# Patient Record
Sex: Female | Born: 1960
Health system: Southern US, Community
[De-identification: ages and names within clinical notes are randomized; demographics above are authoritative.]

## PROBLEM LIST (undated history)

## (undated) DIAGNOSIS — K449 Diaphragmatic hernia without obstruction or gangrene: Secondary | ICD-10-CM

## (undated) DIAGNOSIS — A0472 Enterocolitis due to Clostridium difficile, not specified as recurrent: Secondary | ICD-10-CM

## (undated) DIAGNOSIS — I10 Essential (primary) hypertension: Secondary | ICD-10-CM

## (undated) DIAGNOSIS — K8020 Calculus of gallbladder without cholecystitis without obstruction: Secondary | ICD-10-CM

## (undated) DIAGNOSIS — K219 Gastro-esophageal reflux disease without esophagitis: Secondary | ICD-10-CM

## (undated) DIAGNOSIS — K760 Fatty (change of) liver, not elsewhere classified: Secondary | ICD-10-CM

## (undated) DIAGNOSIS — E785 Hyperlipidemia, unspecified: Secondary | ICD-10-CM

## (undated) DIAGNOSIS — H40009 Preglaucoma, unspecified, unspecified eye: Secondary | ICD-10-CM

## (undated) DIAGNOSIS — K700 Alcoholic fatty liver: Secondary | ICD-10-CM

## (undated) DIAGNOSIS — R7303 Prediabetes: Secondary | ICD-10-CM

## (undated) DIAGNOSIS — R002 Palpitations: Secondary | ICD-10-CM

## (undated) DIAGNOSIS — F17200 Nicotine dependence, unspecified, uncomplicated: Secondary | ICD-10-CM

## (undated) HISTORY — DX: Alcoholic fatty liver: K70.0

## (undated) HISTORY — DX: Enterocolitis due to Clostridium difficile, not specified as recurrent: A04.72

## (undated) HISTORY — DX: Calculus of gallbladder without cholecystitis without obstruction: K80.20

## (undated) HISTORY — DX: Preglaucoma, unspecified, unspecified eye: H40.009

## (undated) HISTORY — DX: Gastro-esophageal reflux disease without esophagitis: K21.9

## (undated) HISTORY — PX: CERVIX LESION DESTRUCTION: SHX591

## (undated) HISTORY — DX: Essential (primary) hypertension: I10

## (undated) HISTORY — DX: Nicotine dependence, unspecified, uncomplicated: F17.200

## (undated) HISTORY — PX: CHOLECYSTECTOMY: SHX55

## (undated) HISTORY — DX: Palpitations: R00.2

## (undated) HISTORY — DX: Prediabetes: R73.03

## (undated) HISTORY — DX: Fatty (change of) liver, not elsewhere classified: K76.0

## (undated) HISTORY — PX: ESOPHAGOGASTRODUODENOSCOPY: SHX1529

## (undated) HISTORY — PX: OVARY SURGERY: SHX727

## (undated) HISTORY — PX: COLONOSCOPY: SHX174

## (undated) HISTORY — DX: Hyperlipidemia, unspecified: E78.5

## (undated) HISTORY — DX: Diaphragmatic hernia without obstruction or gangrene: K44.9

---
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
## 1998-07-08 ENCOUNTER — Encounter: Payer: Self-pay | Admitting: Internal Medicine

## 1998-07-08 ENCOUNTER — Ambulatory Visit (HOSPITAL_COMMUNITY): Admission: RE | Admit: 1998-07-08 | Discharge: 1998-07-08 | Payer: Self-pay | Admitting: Internal Medicine

## 1998-07-15 ENCOUNTER — Encounter: Payer: Self-pay | Admitting: Internal Medicine

## 1998-07-15 ENCOUNTER — Ambulatory Visit (HOSPITAL_COMMUNITY): Admission: RE | Admit: 1998-07-15 | Discharge: 1998-07-15 | Payer: Self-pay | Admitting: Internal Medicine

## 1998-12-09 ENCOUNTER — Emergency Department (HOSPITAL_COMMUNITY): Admission: EM | Admit: 1998-12-09 | Discharge: 1998-12-09 | Payer: Self-pay | Admitting: Emergency Medicine

## 1998-12-31 ENCOUNTER — Encounter: Payer: Self-pay | Admitting: Emergency Medicine

## 1998-12-31 ENCOUNTER — Emergency Department (HOSPITAL_COMMUNITY): Admission: EM | Admit: 1998-12-31 | Discharge: 1998-12-31 | Payer: Self-pay | Admitting: Emergency Medicine

## 2000-10-31 ENCOUNTER — Encounter: Payer: Self-pay | Admitting: Emergency Medicine

## 2000-10-31 ENCOUNTER — Emergency Department (HOSPITAL_COMMUNITY): Admission: EM | Admit: 2000-10-31 | Discharge: 2000-10-31 | Payer: Self-pay | Admitting: Emergency Medicine

## 2001-02-01 ENCOUNTER — Other Ambulatory Visit: Admission: RE | Admit: 2001-02-01 | Discharge: 2001-02-01 | Payer: Self-pay | Admitting: *Deleted

## 2002-03-14 ENCOUNTER — Emergency Department (HOSPITAL_COMMUNITY): Admission: EM | Admit: 2002-03-14 | Discharge: 2002-03-14 | Payer: Self-pay | Admitting: *Deleted

## 2002-10-01 ENCOUNTER — Emergency Department (HOSPITAL_COMMUNITY): Admission: EM | Admit: 2002-10-01 | Discharge: 2002-10-01 | Payer: Self-pay | Admitting: Emergency Medicine

## 2002-12-03 ENCOUNTER — Emergency Department (HOSPITAL_COMMUNITY): Admission: EM | Admit: 2002-12-03 | Discharge: 2002-12-03 | Payer: Self-pay | Admitting: Emergency Medicine

## 2002-12-03 ENCOUNTER — Encounter: Payer: Self-pay | Admitting: Emergency Medicine

## 2003-01-29 ENCOUNTER — Ambulatory Visit (HOSPITAL_COMMUNITY): Admission: RE | Admit: 2003-01-29 | Discharge: 2003-01-29 | Payer: Self-pay | Admitting: Internal Medicine

## 2003-02-05 ENCOUNTER — Ambulatory Visit (HOSPITAL_COMMUNITY): Admission: RE | Admit: 2003-02-05 | Discharge: 2003-02-05 | Payer: Self-pay | Admitting: Internal Medicine

## 2003-09-11 ENCOUNTER — Other Ambulatory Visit: Admission: RE | Admit: 2003-09-11 | Discharge: 2003-09-11 | Payer: Self-pay | Admitting: *Deleted

## 2003-09-13 ENCOUNTER — Encounter: Admission: RE | Admit: 2003-09-13 | Discharge: 2003-09-13 | Payer: Self-pay | Admitting: *Deleted

## 2003-11-08 ENCOUNTER — Emergency Department (HOSPITAL_COMMUNITY): Admission: EM | Admit: 2003-11-08 | Discharge: 2003-11-08 | Payer: Self-pay | Admitting: Emergency Medicine

## 2003-11-14 ENCOUNTER — Emergency Department (HOSPITAL_COMMUNITY): Admission: EM | Admit: 2003-11-14 | Discharge: 2003-11-14 | Payer: Self-pay | Admitting: Emergency Medicine

## 2003-12-14 ENCOUNTER — Emergency Department (HOSPITAL_COMMUNITY): Admission: EM | Admit: 2003-12-14 | Discharge: 2003-12-15 | Payer: Self-pay | Admitting: Emergency Medicine

## 2003-12-15 ENCOUNTER — Emergency Department (HOSPITAL_COMMUNITY): Admission: EM | Admit: 2003-12-15 | Discharge: 2003-12-15 | Payer: Self-pay | Admitting: Emergency Medicine

## 2004-01-22 LAB — HM COLONOSCOPY: HM Colonoscopy: NORMAL

## 2004-04-05 LAB — CONVERTED CEMR LAB: Pap Smear: NORMAL

## 2004-07-26 ENCOUNTER — Emergency Department (HOSPITAL_COMMUNITY): Admission: EM | Admit: 2004-07-26 | Discharge: 2004-07-26 | Payer: Self-pay | Admitting: Family Medicine

## 2004-09-23 ENCOUNTER — Emergency Department (HOSPITAL_COMMUNITY): Admission: EM | Admit: 2004-09-23 | Discharge: 2004-09-23 | Payer: Self-pay | Admitting: Family Medicine

## 2004-11-17 ENCOUNTER — Emergency Department (HOSPITAL_COMMUNITY): Admission: EM | Admit: 2004-11-17 | Discharge: 2004-11-17 | Payer: Self-pay | Admitting: Emergency Medicine

## 2004-12-07 ENCOUNTER — Emergency Department (HOSPITAL_COMMUNITY): Admission: EM | Admit: 2004-12-07 | Discharge: 2004-12-07 | Payer: Self-pay | Admitting: Emergency Medicine

## 2004-12-09 ENCOUNTER — Ambulatory Visit: Payer: Self-pay | Admitting: Internal Medicine

## 2005-04-12 ENCOUNTER — Ambulatory Visit: Payer: Self-pay | Admitting: Internal Medicine

## 2005-04-15 ENCOUNTER — Ambulatory Visit (HOSPITAL_COMMUNITY): Admission: RE | Admit: 2005-04-15 | Discharge: 2005-04-15 | Payer: Self-pay | Admitting: Internal Medicine

## 2005-09-27 ENCOUNTER — Emergency Department (HOSPITAL_COMMUNITY): Admission: EM | Admit: 2005-09-27 | Discharge: 2005-09-27 | Payer: Self-pay | Admitting: Emergency Medicine

## 2006-01-11 ENCOUNTER — Emergency Department (HOSPITAL_COMMUNITY): Admission: EM | Admit: 2006-01-11 | Discharge: 2006-01-11 | Payer: Self-pay | Admitting: Emergency Medicine

## 2006-02-07 ENCOUNTER — Emergency Department (HOSPITAL_COMMUNITY): Admission: EM | Admit: 2006-02-07 | Discharge: 2006-02-07 | Payer: Self-pay | Admitting: Emergency Medicine

## 2006-10-07 ENCOUNTER — Emergency Department (HOSPITAL_COMMUNITY): Admission: EM | Admit: 2006-10-07 | Discharge: 2006-10-07 | Payer: Self-pay | Admitting: Emergency Medicine

## 2007-02-01 ENCOUNTER — Emergency Department (HOSPITAL_COMMUNITY): Admission: EM | Admit: 2007-02-01 | Discharge: 2007-02-01 | Payer: Self-pay | Admitting: Emergency Medicine

## 2007-04-21 ENCOUNTER — Emergency Department (HOSPITAL_COMMUNITY): Admission: EM | Admit: 2007-04-21 | Discharge: 2007-04-21 | Payer: Self-pay | Admitting: Emergency Medicine

## 2007-06-02 DIAGNOSIS — I10 Essential (primary) hypertension: Secondary | ICD-10-CM | POA: Insufficient documentation

## 2007-07-31 ENCOUNTER — Emergency Department (HOSPITAL_COMMUNITY): Admission: EM | Admit: 2007-07-31 | Discharge: 2007-08-01 | Payer: Self-pay | Admitting: Emergency Medicine

## 2007-08-28 ENCOUNTER — Emergency Department (HOSPITAL_COMMUNITY): Admission: EM | Admit: 2007-08-28 | Discharge: 2007-08-28 | Payer: Self-pay | Admitting: Emergency Medicine

## 2007-09-12 ENCOUNTER — Emergency Department (HOSPITAL_COMMUNITY): Admission: EM | Admit: 2007-09-12 | Discharge: 2007-09-12 | Payer: Self-pay | Admitting: Emergency Medicine

## 2007-11-03 ENCOUNTER — Emergency Department (HOSPITAL_COMMUNITY): Admission: EM | Admit: 2007-11-03 | Discharge: 2007-11-03 | Payer: Self-pay | Admitting: Emergency Medicine

## 2008-03-18 ENCOUNTER — Emergency Department (HOSPITAL_COMMUNITY): Admission: EM | Admit: 2008-03-18 | Discharge: 2008-03-18 | Payer: Self-pay | Admitting: Family Medicine

## 2009-01-05 ENCOUNTER — Emergency Department (HOSPITAL_COMMUNITY): Admission: EM | Admit: 2009-01-05 | Discharge: 2009-01-05 | Payer: Self-pay | Admitting: Emergency Medicine

## 2009-05-05 ENCOUNTER — Emergency Department (HOSPITAL_COMMUNITY): Admission: EM | Admit: 2009-05-05 | Discharge: 2009-05-05 | Payer: Self-pay | Admitting: Family Medicine

## 2009-05-10 ENCOUNTER — Emergency Department: Payer: Self-pay | Admitting: Emergency Medicine

## 2009-07-20 ENCOUNTER — Emergency Department (HOSPITAL_COMMUNITY): Admission: EM | Admit: 2009-07-20 | Discharge: 2009-07-21 | Payer: Self-pay | Admitting: Emergency Medicine

## 2010-01-30 ENCOUNTER — Emergency Department (HOSPITAL_COMMUNITY)
Admission: EM | Admit: 2010-01-30 | Discharge: 2010-01-30 | Payer: Self-pay | Source: Home / Self Care | Admitting: Family Medicine

## 2010-02-03 ENCOUNTER — Emergency Department (HOSPITAL_COMMUNITY)
Admission: EM | Admit: 2010-02-03 | Discharge: 2010-02-03 | Payer: Self-pay | Source: Home / Self Care | Admitting: Family Medicine

## 2010-04-07 ENCOUNTER — Emergency Department (HOSPITAL_COMMUNITY)
Admission: EM | Admit: 2010-04-07 | Discharge: 2010-04-07 | Payer: Self-pay | Source: Home / Self Care | Admitting: Family Medicine

## 2010-04-12 ENCOUNTER — Emergency Department (HOSPITAL_COMMUNITY)
Admission: EM | Admit: 2010-04-12 | Discharge: 2010-04-12 | Payer: Self-pay | Source: Home / Self Care | Admitting: Emergency Medicine

## 2010-04-13 ENCOUNTER — Emergency Department (HOSPITAL_COMMUNITY)
Admission: EM | Admit: 2010-04-13 | Discharge: 2010-04-13 | Payer: Self-pay | Source: Home / Self Care | Admitting: Emergency Medicine

## 2010-04-14 ENCOUNTER — Emergency Department (HOSPITAL_COMMUNITY)
Admission: EM | Admit: 2010-04-14 | Discharge: 2010-04-14 | Payer: Self-pay | Source: Home / Self Care | Admitting: Family Medicine

## 2010-04-16 ENCOUNTER — Encounter: Payer: Self-pay | Admitting: Internal Medicine

## 2010-04-16 ENCOUNTER — Ambulatory Visit
Admission: RE | Admit: 2010-04-16 | Discharge: 2010-04-16 | Payer: Self-pay | Source: Home / Self Care | Attending: Internal Medicine | Admitting: Internal Medicine

## 2010-04-16 ENCOUNTER — Other Ambulatory Visit: Payer: Self-pay | Admitting: Internal Medicine

## 2010-04-16 DIAGNOSIS — E785 Hyperlipidemia, unspecified: Secondary | ICD-10-CM | POA: Insufficient documentation

## 2010-04-16 DIAGNOSIS — R002 Palpitations: Secondary | ICD-10-CM | POA: Insufficient documentation

## 2010-04-16 LAB — CBC WITH DIFFERENTIAL/PLATELET
Basophils Absolute: 0 10*3/uL (ref 0.0–0.1)
Basophils Relative: 0.6 % (ref 0.0–3.0)
Eosinophils Absolute: 0.1 10*3/uL (ref 0.0–0.7)
Eosinophils Relative: 1.8 % (ref 0.0–5.0)
HCT: 41.2 % (ref 36.0–46.0)
Hemoglobin: 13.9 g/dL (ref 12.0–15.0)
Lymphocytes Relative: 38.4 % (ref 12.0–46.0)
Lymphs Abs: 2.1 10*3/uL (ref 0.7–4.0)
MCHC: 33.9 g/dL (ref 30.0–36.0)
MCV: 99 fl (ref 78.0–100.0)
Monocytes Absolute: 0.5 10*3/uL (ref 0.1–1.0)
Monocytes Relative: 9.4 % (ref 3.0–12.0)
Neutro Abs: 2.7 10*3/uL (ref 1.4–7.7)
Neutrophils Relative %: 49.8 % (ref 43.0–77.0)
Platelets: 295 10*3/uL (ref 150.0–400.0)
RBC: 4.16 Mil/uL (ref 3.87–5.11)
RDW: 13.7 % (ref 11.5–14.6)
WBC: 5.5 10*3/uL (ref 4.5–10.5)

## 2010-04-16 LAB — BASIC METABOLIC PANEL
BUN: 18 mg/dL (ref 6–23)
CO2: 28 mEq/L (ref 19–32)
Calcium: 9.4 mg/dL (ref 8.4–10.5)
Chloride: 103 mEq/L (ref 96–112)
Creatinine, Ser: 0.8 mg/dL (ref 0.4–1.2)
GFR: 84.83 mL/min (ref >60.00)
Glucose, Bld: 89 mg/dL (ref 70–99)
Potassium: 4.5 mEq/L (ref 3.5–5.1)
Sodium: 138 mEq/L (ref 135–145)

## 2010-04-16 LAB — LIPID PANEL
Cholesterol: 175 mg/dL (ref 0–200)
HDL: 67.3 mg/dL (ref >39.00)
LDL Cholesterol: 101 mg/dL — ABNORMAL HIGH (ref 0–99)
Total CHOL/HDL Ratio: 3
Triglycerides: 36 mg/dL (ref 0.0–149.0)
VLDL: 7.2 mg/dL (ref 0.0–40.0)

## 2010-04-16 LAB — HEPATIC FUNCTION PANEL
ALT: 18 U/L (ref 0–35)
AST: 22 U/L (ref 0–37)
Albumin: 4 g/dL (ref 3.5–5.2)
Alkaline Phosphatase: 53 U/L (ref 39–117)
Bilirubin, Direct: 0.1 mg/dL (ref 0.0–0.3)
Total Bilirubin: 0.6 mg/dL (ref 0.3–1.2)
Total Protein: 6.8 g/dL (ref 6.0–8.3)

## 2010-04-16 LAB — TSH: TSH: 1.9 u[IU]/mL (ref 0.35–5.50)

## 2010-04-17 ENCOUNTER — Telehealth: Payer: Self-pay | Admitting: Internal Medicine

## 2010-04-27 DIAGNOSIS — K449 Diaphragmatic hernia without obstruction or gangrene: Secondary | ICD-10-CM | POA: Insufficient documentation

## 2010-04-27 DIAGNOSIS — H40009 Preglaucoma, unspecified, unspecified eye: Secondary | ICD-10-CM | POA: Insufficient documentation

## 2010-04-28 ENCOUNTER — Ambulatory Visit
Admission: RE | Admit: 2010-04-28 | Discharge: 2010-04-28 | Payer: Self-pay | Source: Home / Self Care | Attending: Cardiovascular Disease | Admitting: Cardiovascular Disease

## 2010-04-28 DIAGNOSIS — F17200 Nicotine dependence, unspecified, uncomplicated: Secondary | ICD-10-CM | POA: Insufficient documentation

## 2010-04-28 DIAGNOSIS — K700 Alcoholic fatty liver: Secondary | ICD-10-CM | POA: Insufficient documentation

## 2010-05-07 ENCOUNTER — Emergency Department (HOSPITAL_COMMUNITY)
Admission: EM | Admit: 2010-05-07 | Discharge: 2010-05-07 | Disposition: A | Discharge status: Home / Self Care | Payer: 59 | Attending: Emergency Medicine | Admitting: Emergency Medicine

## 2010-05-07 ENCOUNTER — Emergency Department (HOSPITAL_COMMUNITY): Payer: 59

## 2010-05-07 DIAGNOSIS — I10 Essential (primary) hypertension: Secondary | ICD-10-CM | POA: Insufficient documentation

## 2010-05-07 DIAGNOSIS — IMO0002 Reserved for concepts with insufficient information to code with codable children: Secondary | ICD-10-CM | POA: Insufficient documentation

## 2010-05-07 DIAGNOSIS — Y92009 Unspecified place in unspecified non-institutional (private) residence as the place of occurrence of the external cause: Secondary | ICD-10-CM | POA: Insufficient documentation

## 2010-05-07 DIAGNOSIS — S90129A Contusion of unspecified lesser toe(s) without damage to nail, initial encounter: Secondary | ICD-10-CM | POA: Insufficient documentation

## 2010-05-07 DIAGNOSIS — M79609 Pain in unspecified limb: Secondary | ICD-10-CM | POA: Insufficient documentation

## 2010-05-07 NOTE — Assessment & Plan Note (Signed)
Summary: palpitations/mt      Allergies Added:   Primary Provider:  Janith Lima MD  CC:  referal from Dr. Ronnald Ramp for palpitaions.Marland KitchenMarland KitchenMarland KitchenMarland Kitchenpt states since shes been off caffine she has had no symptoms..pt is currently on no meds.  History of Present Illness: 50 yo referred for palpiatoins.  Benign sounding with occasional flip flops.  No rapid prolonged episodes.  No previous cardiac problems.  CRF's include smoking and positive family history  Reviewed ECG and it is normal 04/16/10.  Counseled for less than 10 minutes on smoking cessation and nicotine relationship to palpitatoins and developing CAD.  Last quit 5 years ago.  Suggested nicotine gum or patches starting at 75m.  Also has issues with ETOH.  3-4 Mikes Hard Lemonades/day.  Has cut back recently.  Has atypical muscular pains in chest and back.  No cough, sputum, edema or history of CHF or valvular heart disease  Current Problems (verified): 1)  Hyperlipidemia  (ICD-272.4) 2)  Palpitations, Occasional  (ICD-785.1) 3)  Hypertension  (ICD-401.9) 4)  Hiatal Hernia  (ICD-553.3) 5)  Glaucoma, Borderline  (ICD-365.00) 6)  Routine General Medical Exam@[facility]  (ICD-V70.0) 7)  Family History of Cad Female 1st Degree Relative <50  (ICD-V17.3)  Current Medications (verified): 1)  None  Allergies (verified): 1)  ! Penicillin 2)  ! Doxycycline 3)  ! Sulfa 4)  ! 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   Vital Signs:  Patient profile:   50year old female Menstrual status:  postmenopausal Height:      62 inches Weight:      157 pounds BMI:     28.82 Pulse rate:   84 / minute Resp:     14 per minute BP sitting:   129 / 87  (left arm)  Vitals Entered By: KBurnett Kanaris(April 28, 2010 8:32 AM)  Physical Exam  General:  Affect appropriate Healthy:  appears stated age HEENT: normal Neck supple with no adenopathy JVP normal no bruits no thyromegaly Lungs clear with no wheezing and good diaphragmatic motion Heart:  S1/S2 no murmur,rub, gallop or click PMI normal Abdomen: benighn, BS positve, no tenderness, no AAA no bruit.  No HSM or HJR Distal pulses intact with no bruits No edema Neuro non-focal Skin warm and dry    Impression & Recommendations:  Problem # 1:  PALPITATIONS, OCCASIONAL (ICD-785.1) Benign.  No need for monitor if stress echo normal Orders: Stress Echo (Stress Echo)  Problem # 2:  HYPERTENSION (ICD-401.9) Low sodium diet.  Monitor at home  Problem # 3:  FAMILY HISTORY OF CAD FEMALE 1ST DEGREE RELATIVE <50 (ICD-V17.3) Smoker with family history and atypical chest pain.  F/U stress echo.    Problem # 4:  ALCOHOLIC FATTY LIVER (ITDD-220.2 Discussed ETOH with patient.  Clearly lifestyle  of smoking and drinking is not conducive to good health.  Likely related to palpitations as well.  Encouraged her to seek counseling if she resumes daily ETOH again.  Patient Instructions: 1)  Your physician has requested that you have a stress echocardiogram. For further information please visit [email or web address].  Please follow instruction sheet as  given.

## 2010-05-07 NOTE — Progress Notes (Signed)
Summary: results  Phone Note Call from Patient Call back at Home Phone [phone]   Caller: Patient Call For: Janith Lima MD Summary of Call: Pt called and states she is returning a call from the office. She believes MD or CMA may have called her. Initial call taken by: Denice Paradise,  April 17, 2010 12:43 PM  Follow-up for Phone Call        Need to inform labs are normal..Lakisha Archie CMA  April 17, 2010 1:54 PM   LMOVM for pt to call back// labs normal..Lakisha Archie CMA  April 20, 2010 10:09 AM   Patient notified// copy mailed out.Ellison Hughs Archie CMA  April 20, 2010 10:14 AM

## 2010-05-07 NOTE — Assessment & Plan Note (Signed)
Summary: NEW/ BP PROBLEMS/ UMR/ /NWS  #   Vital Signs:  Patient profile:   50 year old female Menstrual status:  postmenopausal Height:      62 inches Weight:      155 pounds BMI:     28.45 O2 Sat:      98 % on Room air Temp:     96.5 degrees F oral Pulse rate:   86 / minute Pulse rhythm:   regular Resp:     16 per minute BP sitting:   128 / 86  (left arm) Cuff size:   large  Vitals Entered By: Estell Harpin CMA (April 16, 2010 9:06 AM)  Nutrition Counseling: Patient's BMI is greater than 25 and therefore counseled on weight management options.  O2 Flow:  Room air CC: New to establish, Preventive Care, Hypertension Management Is Patient Diabetic? No  Does patient need assistance? Functional Status Self care Ambulation Normal     Menstrual Status postmenopausal Last PAP Result normal   Primary Care Provider:  Janith Lima MD  CC:  New to establish, Preventive Care, and Hypertension Management.  History of Present Illness: New to me she reports intermittent palpitations for several years. She has been to the ER about one year ago and she tells me that her EKG was normal. She went to the ER earlier this week for a severe toothache and she says that her BP was high but she tells me that when she is calm and pain free her BP is always less then 130/80. She notices her palpitations after she has been active and sits down, then she feels a few skipped beats. She feels like her palpitations have been more frequent for the last month.  Hypertension History:      She complains of palpitations, but denies headache, chest pain, dyspnea with exertion, orthopnea, PND, peripheral edema, visual symptoms, neurologic problems, and syncope.        Positive major cardiovascular risk factors include hyperlipidemia, hypertension, family history for ischemic heart disease (males less than 38 years old), and current tobacco user.  Negative major cardiovascular risk factors include female  age less than 35 years old and no history of diabetes.        Further assessment for target organ damage reveals no history of ASHD, cardiac end-organ damage (CHF/LVH), stroke/TIA, peripheral vascular disease, renal insufficiency, or hypertensive retinopathy.     Preventive Screening-Counseling & Management  Alcohol-Tobacco     Alcohol drinks/day: 3     Alcohol type: spirits     >5/day in last 3 mos: no     Alcohol Counseling: to decrease amount and/or frequency of alcohol intake     Feels need to cut down: no     Feels annoyed by complaints: no     Feels guilty re: drinking: no     Needs 'eye opener' in am: no     Smoking Status: current     Smoking Cessation Counseling: yes     Smoke Cessation Stage: precontemplative     Packs/Day: 1.0     Year Started: 1991     Pack years: 20     Tobacco Counseling: to quit use of tobacco products  Caffeine-Diet-Exercise     Does Patient Exercise: yes  Hep-HIV-STD-Contraception     Hepatitis Risk: no risk noted     HIV Risk: no risk noted     STD Risk: no risk noted      Sexual History:  currently monogamous.  Drug Use:  no.        Blood Transfusions:  no.    Current Medications (verified): 1)  Clindamycin Hcl 150 Mg Caps (Clindamycin Hcl) .... Take 1 Tablet By Mouth Four Times A Day  Allergies (verified): 1)  ! Penicillin 2)  ! Doxycycline 3)  ! Sulfa 4)  ! Tessalon Perles (Benzonatate)  Past History:  Family History: Last updated: 04/16/2010 Family History of CAD Female 1st degree relative <50 Family History of Thromboembolism clotting disorder- mother  Risk Factors: Alcohol Use: 3 (04/16/2010) >5 drinks/d w/in last 3 months: no (04/16/2010) Exercise: yes (04/16/2010)  Past Medical History: Hyperlipidemia Hypertension  Past Surgical History: Denies surgical history  Family History: Reviewed history and no changes required. Family History of CAD Female 1st degree relative <50 Family History of Thromboembolism  clotting disorder- mother  Social History: Reviewed history and no changes required. 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   CV:  Complains of palpitations; denies chest pain or discomfort, difficulty breathing at night, difficulty breathing while lying down, fainting, fatigue, leg cramps with exertion, lightheadness, near fainting, shortness of breath with exertion, swelling of feet, and swelling of hands.  Physical Exam  General:  alert, well-developed, well-nourished, well-hydrated, appropriate dress, normal appearance, healthy-appearing, cooperative to examination, good hygiene, and overweight-appearing.   Head:  normocephalic, atraumatic, no abnormalities observed, and no abnormalities palpated.   Eyes:  vision grossly intact, pupils equal, pupils round, and pupils reactive to light.   Ears:  R ear normal and L ear normal.   Nose:  External nasal examination shows no deformity or inflammation. Nasal mucosa are pink and moist without lesions or exudates. Mouth:  Oral mucosa and oropharynx without lesions or exudates.  Teeth in good repair. Neck:  supple, full ROM, no masses, no thyromegaly, no thyroid nodules or tenderness, no JVD, normal carotid upstroke, no carotid bruits, no cervical lymphadenopathy, and no neck tenderness.   Lungs:  normal respiratory effort, no intercostal retractions, no accessory muscle use, normal breath sounds,  no dullness, no fremitus, no crackles, and no wheezes.   Heart:  normal rate, regular rhythm, no murmur, no gallop, no rub, and no JVD.   Abdomen:  soft, non-tender, normal bowel sounds, no distention, no masses, no guarding, no rigidity, no rebound tenderness, no abdominal hernia, no inguinal hernia, no hepatomegaly, and no splenomegaly.   Msk:  No deformity or scoliosis noted of thoracic or lumbar spine.   Pulses:  R and L carotid,radial,femoral,dorsalis pedis and posterior tibial pulses are full and equal bilaterally Extremities:  No clubbing, cyanosis, edema, or deformity noted with normal full range of motion of all joints.   Neurologic:  No cranial nerve deficits noted. Station and gait are normal. Plantar reflexes are down-going bilaterally. DTRs are symmetrical throughout. Sensory, motor and coordinative functions appear intact. Skin:  Intact without suspicious lesions or rashes Cervical Nodes:  No lymphadenopathy noted Axillary Nodes:  No palpable lymphadenopathy Psych:  Cognition and judgment appear intact. Alert and cooperative with  normal attention span and concentration. No apparent delusions, illusions, hallucinations   Impression & Recommendations:  Problem # 1:  PALPITATIONS, OCCASIONAL (ICD-785.1) Assessment New she may need a Holter monitor so I will refer her to cardiology Orders: Venipuncture (62035) TLB-Lipid Panel (80061-LIPID) TLB-BMP (Basic Metabolic Panel-BMET) (59741-ULAGTXM) TLB-CBC Platelet - w/Differential (85025-CBCD) TLB-Hepatic/Liver Function Pnl (80076-HEPATIC) TLB-TSH (Thyroid Stimulating Hormone) (84443-TSH) EKG w/ Interpretation (93000) Cardiology Referral (Cardiology)  Problem # 2:  FAMILY HISTORY OF CAD FEMALE 1ST DEGREE RELATIVE <50 (ICD-V17.3) Assessment: New  she has several risk factors for CAD so will refer her to cardiology to see if she needs further testing  Orders: Cardiology Referral (Cardiology)  Problem # 3:  HYPERTENSION  (ICD-401.9) Assessment: Improved  Orders: Venipuncture (46803) TLB-Lipid Panel (80061-LIPID) TLB-BMP (Basic Metabolic Panel-BMET) ([id number]) TLB-CBC Platelet - w/Differential (85025-CBCD) TLB-Hepatic/Liver Function Pnl (80076-HEPATIC) TLB-TSH (Thyroid Stimulating Hormone) (84443-TSH)  BP today: 128/86  10 Yr Risk Heart Disease: Not enough information  Complete Medication List: 1)  Clindamycin Hcl 150 Mg Caps (Clindamycin hcl) .... Take 1 tablet by mouth four times a day  Other Orders: Radiology Referral (Radiology)  Hypertension Assessment/Plan:      The patient's hypertensive risk group is category B: At least one risk factor (excluding diabetes) with no target organ damage.  Today's blood pressure is 128/86.  Her blood pressure goal is < 140/90.  Colorectal Screening:  Colonoscopy Results:    Date of Exam: 01/22/2004    Results: Normal  PAP Screening:    Hx Cervical Dysplasia in last 5 yrs? No    3 normal PAP smears in last 5 yrs? No    Last PAP smear:  04/05/2004    Reviewed PAP smear recommendations:  patient refuses understanding risks of delayed diagnosis  Mammogram Screening:    Last Mammogram:  04/05/2004    Reviewed Mammogram recommendations:  mammogram ordered  Osteoporosis Risk Assessment:  Risk Factors for Fracture or Low Bone Density:   Race (White or Asian):     yes   Smoking status:       current  Immunization & Chemoprophylaxis:    Tetanus vaccine: Historical  (04/05/2009)    Influenza vaccine: given  (01/03/2010)  Patient Instructions: 1)  Tobacco is very bad for your health and your loved ones! You Should stop smoking!. 2)  Stop Smoking Tips: Choose a Quit date. Cut down before the Quit date. decide what you will do as a substitute when you feel the urge to smoke(gum,toothpick,exercise). 3)  Schedule your mammogram. 4)  Check your Blood Pressure regularly. If it is above 140/90: you should make an appointment.   Orders Added: 1)   Venipuncture [04888] 2)  TLB-Lipid Panel [80061-LIPID] 3)  TLB-BMP (Basic Metabolic Panel-BMET) [[id number]] 4)  TLB-CBC Platelet - w/Differential [85025-CBCD] 5)  TLB-Hepatic/Liver Function Pnl [80076-HEPATIC] 6)  TLB-TSH (Thyroid Stimulating Hormone) [84443-TSH] 7)  Radiology Referral [Radiology] 8)  EKG w/ Interpretation [93000] 9)  Cardiology Referral [Cardiology] 10)  Est. Patient Level V [80034]    Preventive Care Screening  Last Flu Shot:    Date:  01/03/2010    Results:  given   Last Tetanus Booster:    Date:  04/05/2009    Results:  Historical   Mammogram:    Date:  04/05/2004    Results:  normal   Pap Smear:    Date:  04/05/2004    Results:  normal

## 2010-05-07 NOTE — Letter (Signed)
Summary: Lipid Letter  [facility]   [place], [place] [address]   Phone: [phone]  Fax: [phone]    04/16/2010  Paula Parker [address], [place]  [address]  Dear Paula Parker:  We have carefully reviewed your last lipid profile from 04/16/2010 and the results are noted below with a summary of recommendations for lipid management.    Cholesterol:       175     Goal: <200   HDL "good" Cholesterol:   67.30     Goal: >50   LDL "bad" Cholesterol:   101     Goal: <130   Triglycerides:       36.0     Goal: <150    all of your labs look good    TLC Diet (Therapeutic Lifestyle Change): Saturated Fats & Transfatty acids should be kept < 7% of total calories ***Reduce Saturated Fats Polyunstaurated Fat can be up to 10% of total calories Monounsaturated Fat Fat can be up to 20% of total calories Total Fat should be no greater than 25-35% of total calories Carbohydrates should be 50-60% of total calories Protein should be approximately 15% of total calories Fiber should be at least 20-30 grams a day ***Increased fiber may help lower LDL Total Cholesterol should be < 265m/day Consider adding plant stanol/sterols to diet (example: Benacol spread) ***A higher intake of unsaturated fat may reduce Triglycerides and Increase HDL    Adjunctive Measures (may lower LIPIDS and reduce risk of Heart Attack) include: Aerobic Exercise (20-30 minutes 3-4 times a week) Limit Alcohol Consumption Weight Reduction Aspirin 75-81 mg a day by mouth (if not allergic or contraindicated) Dietary Fiber 20-30 grams a day by mouth     Current Medications: 1)    Clindamycin Hcl 150 Mg Caps (Clindamycin hcl) .... Take 1 tablet by mouth four times a day  If you have any questions, please call. We appreciate being able to work with you.   Sincerely,    [facility] TJanith LimaMD

## 2010-05-11 ENCOUNTER — Encounter: Payer: Self-pay | Admitting: Internal Medicine

## 2010-05-12 ENCOUNTER — Telehealth: Payer: Self-pay | Admitting: Internal Medicine

## 2010-05-13 ENCOUNTER — Encounter: Payer: Self-pay | Admitting: Internal Medicine

## 2010-05-13 ENCOUNTER — Ambulatory Visit (INDEPENDENT_AMBULATORY_CARE_PROVIDER_SITE_OTHER): Payer: Commercial Managed Care - PPO | Admitting: Internal Medicine

## 2010-05-13 DIAGNOSIS — S99919A Unspecified injury of unspecified ankle, initial encounter: Secondary | ICD-10-CM | POA: Insufficient documentation

## 2010-05-13 DIAGNOSIS — S8990XA Unspecified injury of unspecified lower leg, initial encounter: Secondary | ICD-10-CM

## 2010-05-13 DIAGNOSIS — S99929A Unspecified injury of unspecified foot, initial encounter: Secondary | ICD-10-CM

## 2010-05-18 ENCOUNTER — Encounter: Payer: Self-pay | Admitting: Internal Medicine

## 2010-05-18 ENCOUNTER — Telehealth: Payer: Self-pay | Admitting: Internal Medicine

## 2010-05-20 ENCOUNTER — Telehealth (INDEPENDENT_AMBULATORY_CARE_PROVIDER_SITE_OTHER): Payer: Self-pay | Admitting: *Deleted

## 2010-05-21 ENCOUNTER — Ambulatory Visit (HOSPITAL_BASED_OUTPATIENT_CLINIC_OR_DEPARTMENT_OTHER): Payer: 59

## 2010-05-21 ENCOUNTER — Ambulatory Visit (HOSPITAL_COMMUNITY): Payer: 59 | Attending: Cardiovascular Disease

## 2010-05-21 DIAGNOSIS — R0989 Other specified symptoms and signs involving the circulatory and respiratory systems: Secondary | ICD-10-CM

## 2010-05-21 DIAGNOSIS — R002 Palpitations: Secondary | ICD-10-CM | POA: Insufficient documentation

## 2010-05-21 DIAGNOSIS — R072 Precordial pain: Secondary | ICD-10-CM

## 2010-05-21 NOTE — Assessment & Plan Note (Signed)
Summary: recheck toes??   Vital Signs:  Patient profile:   50 year old female Menstrual status:  postmenopausal Height:      62 inches Weight:      160 pounds O2 Sat:      96 % on Room air Temp:     98.4 degrees F oral Pulse rate:   92 / minute Pulse rhythm:   regular Resp:     16 per minute BP sitting:   130 / 82  (left arm) Cuff size:   regular  Vitals Entered By: Estell Harpin CMA (May 13, 2010 3:42 PM)  O2 Flow:  Room air  Primary Care Provider:  Janith Lima MD   History of Present Illness: She returns and needs a note for work. She tells me that she stubbed her right foot on the hearth 1 week ago and went to the ER and xrays were normal. She had pain in the right 3rd and 4th toes but no bruising or swelling. She is taking Tylenol and that is controlling her pain. She wants to go back to work next Monday.  Preventive Screening-Counseling & Management  Alcohol-Tobacco     Alcohol drinks/day: 3     Alcohol type: spirits     >5/day in last 3 mos: no     Alcohol Counseling: to decrease amount and/or frequency of alcohol intake     Feels need to cut down: no     Feels annoyed by complaints: no     Feels guilty re: drinking: no     Needs 'eye opener' in am: no     Smoking Status: current     Smoking Cessation Counseling: yes     Smoke Cessation Stage: precontemplative     Packs/Day: 1.0     Year Started: 1991     Pack years: 20     Tobacco Counseling: to quit use of tobacco products  Hep-HIV-STD-Contraception     Hepatitis Risk: no risk noted     HIV Risk: no risk noted     STD Risk: no risk noted      Sexual History:  currently monogamous.        Drug Use:  no.        Blood Transfusions:  no.    Clinical Review Panels:  Prevention   Last Mammogram:  Normal Bilateral (05/11/2010)   Last Pap Smear:  normal (04/05/2004)   Last Colonoscopy:  Normal (01/22/2004)  Immunizations   Last Tetanus Booster:  Historical (04/05/2009)   Last Flu Vaccine:   given (01/03/2010)  Lipid Management   Cholesterol:  175 (04/16/2010)   LDL (bad choesterol):  101 (04/16/2010)   HDL (good cholesterol):  67.30 (04/16/2010)  Diabetes Management   Creatinine:  0.8 (04/16/2010)   Last Flu Vaccine:  given (01/03/2010)  CBC   WBC:  5.5 (04/16/2010)   RBC:  4.16 (04/16/2010)   Hgb:  13.9 (04/16/2010)   Hct:  41.2 (04/16/2010)   Platelets:  295.0 (04/16/2010)   MCV  99.0 (04/16/2010)   MCHC  33.9 (04/16/2010)   RDW  13.7 (04/16/2010)   PMN:  49.8 (04/16/2010)   Lymphs:  38.4 (04/16/2010)   Monos:  9.4 (04/16/2010)   Eosinophils:  1.8 (04/16/2010)   Basophil:  0.6 (04/16/2010)  Complete Metabolic Panel   Glucose:  89 (04/16/2010)   Sodium:  138 (04/16/2010)   Potassium:  4.5 (04/16/2010)   Chloride:  103 (04/16/2010)   CO2:  28 (04/16/2010)   BUN:  18 (04/16/2010)   Creatinine:  0.8 (04/16/2010)   Albumin:  4.0 (04/16/2010)   Total Protein:  6.8 (04/16/2010)   Calcium:  9.4 (04/16/2010)   Total Bili:  0.6 (04/16/2010)   Alk Phos:  53 (04/16/2010)   SGPT (ALT):  18 (04/16/2010)   SGOT (AST):  22 (04/16/2010)   Medications Prior to Update: 1)  None  Current Medications (verified): 1)  None  Allergies (verified): 1)  ! Penicillin 2)  ! Doxycycline 3)  ! Sulfa 4)  ! 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 Family History of CAD Female 1st degree relative <50 Family History of Thromboembolism clotting disorder- mother  Social History: Reviewed history from 04/16/2010 and no changes required. 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   MS:  Complains of joint pain; denies joint redness, joint swelling, loss of strength, low back pain, muscle aches, and stiffness.  Physical Exam  General:  alert, well-developed, well-nourished, well-hydrated, appropriate dress, normal appearance, healthy-appearing, cooperative to examination, good hygiene, and overweight-appearing.   Mouth:  Oral mucosa and oropharynx without lesions or exudates.  Teeth in good repair. Neck:  supple, full ROM, no masses, no thyromegaly, no thyroid nodules or tenderness, no JVD, normal carotid upstroke, no carotid bruits, no cervical lymphadenopathy, and no neck tenderness.   Lungs:  normal respiratory effort, no intercostal retractions, no accessory muscle use, normal breath sounds, no dullness, no fremitus, no crackles, and no wheezes.   Heart:  normal rate, regular rhythm, no murmur, no gallop, no rub, and no JVD.   Abdomen:  soft, non-tender, normal bowel sounds, no distention, no masses, no guarding, no rigidity, no rebound tenderness, no abdominal hernia, no inguinal hernia, no hepatomegaly, and no splenomegaly.   Msk:  normal ROM, no joint tenderness, no joint swelling, no joint warmth, no redness over  joints, no joint deformities, no joint instability, no crepitation, and no muscle atrophy.  Exam of her right foot is normal. Pulses:  R and L carotid,radial,femoral,dorsalis pedis and posterior tibial pulses are full and equal bilaterally Extremities:  No clubbing, cyanosis, edema, or deformity noted with normal full range of motion of all joints.   Neurologic:  No cranial nerve deficits noted. Station and gait are normal. Plantar reflexes are down-going bilaterally. DTRs are symmetrical throughout. Sensory, motor and coordinative functions appear intact. Skin:  Intact without suspicious lesions or rashes Cervical Nodes:  No lymphadenopathy noted Psych:  Cognition and judgment appear intact. Alert and cooperative with normal attention span and concentration. No apparent delusions, illusions, hallucinations   Impression & Recommendations:  Problem # 1:  FOOT INJURY, RIGHT (ICD-959.7) Assessment Improved  Patient Instructions: 1)  Please schedule a follow-up appointment as needed. 2)  Take 650-104m of Tylenol every 4-6 hours as needed for relief of pain or comfort of fever AVOID taking more than 40049m in a 24 hour period (can cause liver damage in higher doses). 3)  Take 400-60021mf Ibuprofen (Advil, Motrin) with food every 4-6 hours as needed for relief of pain or comfort of fever. 4)  You may move around but avoid painful motions. Apply ice to sore area for 20 minutes 3-4 times a day for 2-3 days.   Orders Added: 1)  Est. Patient Level III [[id number][31438]

## 2010-05-21 NOTE — Progress Notes (Signed)
    PAP Screening:    Last PAP smear:  04/05/2004  Mammogram Screening:    Last Mammogram:  05/11/2010    Reviewed Mammogram recommendations:  mammogram ordered  Mammogram Results:    Date of Exam:  05/11/2010    Results:  Normal Bilateral  Osteoporosis Risk Assessment:  Risk Factors for Fracture or Low Bone Density:   Race (White or Asian):     yes   Smoking status:       current  Immunization & Chemoprophylaxis:    Tetanus vaccine: Historical  (04/05/2009)    Influenza vaccine: given  (01/03/2010)

## 2010-05-21 NOTE — Letter (Signed)
Summary: Out of Work  [facility]   [place], [place] [address]   Phone: [phone]  Fax: [phone]    May 13, 2010   Employee:  BROCK MOKRY    To Whom It May Concern:   For Medical reasons, please excuse the above named employee from work for the following dates:  Start:   05/07/10  End:   05/18/10  If you need additional information, please feel free to contact our office.         Sincerely,    Janith Lima MD

## 2010-05-26 ENCOUNTER — Telehealth: Payer: Self-pay | Admitting: Internal Medicine

## 2010-05-27 NOTE — Progress Notes (Signed)
Summary: Stress echo appt  Phone Note Outgoing Call Call back at [facility] Phone [phone]   Call placed by: Eliezer Lofts, EMT-P,  May 20, 2010 2:48 PM Action Taken: Phone Call Completed Summary of Call: Left message ref: stress echo appt. Eliezer Lofts, EMT-P  May 20, 2010 2:48 PM

## 2010-05-27 NOTE — Progress Notes (Signed)
Summary: work note  Phone Note Call from Patient Call back at [employer] [phone]   Caller: Patient Summary of Call: Patient called [facility] stating that the return to work letter that was given must state ok to return to work with no restrictions.Marland KitchenMarland KitchenEllison Hughs Archie CMA  May 18, 2010 9:41 AM   Follow-up for Phone Call        New letter pick up by patient.Marland KitchenMarland KitchenNorton  May 18, 2010 11:05 AM

## 2010-05-27 NOTE — Letter (Signed)
Summary: Out of Work  [facility]   [place], [place] [address]   Phone: [phone]  Fax: [phone]    May 18, 2010   Employee:  ESTY AHUJA    To Whom It May Concern:   For Medical reasons, please excuse the above named employee from work for the following dates:  Start:   05/07/2010  End:   05/18/2010  Patient will return to work 05/19/2010 with no restrictions.  If you need additional information, please feel free to contact our office.         Sincerely,    Ellison Hughs A CMA for Dr. Scarlette Calico

## 2010-06-02 NOTE — Progress Notes (Signed)
Summary: RESULTS  Phone Note Call from Patient   Summary of Call: Patient is requesting results of echo & stress test.  Initial call taken by: Charlsie Quest, [place],  May 26, 2010 3:38 PM  Follow-up for Phone Call        looks ok to me but run this by her Cardiologist Follow-up by: Janith Lima MD,  May 27, 2010 7:11 AM  Additional Follow-up for Phone Call Additional follow up Details #1::        left detailed vm for pt on hm # Additional Follow-up by: Charlsie Quest, CMA,  May 27, 2010 4:18 PM

## 2010-07-04 ENCOUNTER — Inpatient Hospital Stay (INDEPENDENT_AMBULATORY_CARE_PROVIDER_SITE_OTHER)
Admission: RE | Admit: 2010-07-04 | Discharge: 2010-07-04 | Disposition: A | Discharge status: Home / Self Care | Payer: Commercial Managed Care - PPO | Source: Ambulatory Visit | Attending: Emergency Medicine | Admitting: Emergency Medicine

## 2010-07-04 DIAGNOSIS — H6590 Unspecified nonsuppurative otitis media, unspecified ear: Secondary | ICD-10-CM

## 2010-07-04 DIAGNOSIS — M26609 Unspecified temporomandibular joint disorder, unspecified side: Secondary | ICD-10-CM

## 2010-08-15 ENCOUNTER — Emergency Department (HOSPITAL_COMMUNITY)
Admission: EM | Admit: 2010-08-15 | Discharge: 2010-08-15 | Disposition: A | Discharge status: Home / Self Care | Payer: 59 | Attending: Emergency Medicine | Admitting: Emergency Medicine

## 2010-08-15 DIAGNOSIS — M545 Low back pain, unspecified: Secondary | ICD-10-CM | POA: Insufficient documentation

## 2010-08-15 DIAGNOSIS — I10 Essential (primary) hypertension: Secondary | ICD-10-CM | POA: Insufficient documentation

## 2010-08-15 LAB — URINALYSIS, ROUTINE W REFLEX MICROSCOPIC
Hgb urine dipstick: NEGATIVE
Ketones, ur: NEGATIVE mg/dL
Specific Gravity, Urine: 1.028 (ref 1.005–1.030)
pH: 5.5 (ref 5.0–8.0)

## 2010-08-15 LAB — WET PREP, GENITAL

## 2010-08-15 LAB — RPR: RPR Ser Ql: NONREACTIVE

## 2010-08-17 LAB — GC/CHLAMYDIA PROBE AMP, GENITAL: GC Probe Amp, Genital: NEGATIVE

## 2010-09-08 ENCOUNTER — Inpatient Hospital Stay (INDEPENDENT_AMBULATORY_CARE_PROVIDER_SITE_OTHER)
Admission: RE | Admit: 2010-09-08 | Discharge: 2010-09-08 | Disposition: A | Discharge status: Home / Self Care | Payer: 59 | Source: Ambulatory Visit | Attending: Emergency Medicine | Admitting: Emergency Medicine

## 2010-09-08 DIAGNOSIS — K029 Dental caries, unspecified: Secondary | ICD-10-CM

## 2010-09-08 DIAGNOSIS — K089 Disorder of teeth and supporting structures, unspecified: Secondary | ICD-10-CM

## 2010-11-10 ENCOUNTER — Ambulatory Visit: Payer: Self-pay

## 2010-11-10 ENCOUNTER — Other Ambulatory Visit: Payer: Self-pay | Admitting: Occupational Medicine

## 2010-11-10 DIAGNOSIS — R52 Pain, unspecified: Secondary | ICD-10-CM

## 2010-12-29 LAB — COMPREHENSIVE METABOLIC PANEL
ALT: 21
AST: 25
Albumin: 4.5
Alkaline Phosphatase: 56
CO2: 28
Chloride: 103
Potassium: 4.1
Total Protein: 7.1

## 2010-12-29 LAB — CBC
Hemoglobin: 15.1 — ABNORMAL HIGH
MCHC: 34.3
Platelets: 306
RBC: 4.66
RDW: 12.6
WBC: 7.9

## 2010-12-29 LAB — DIFFERENTIAL
Basophils Absolute: 0
Basophils Relative: 1
Eosinophils Absolute: 0
Monocytes Relative: 5
Neutro Abs: 4.9
Neutrophils Relative %: 62

## 2010-12-29 LAB — LIPASE, BLOOD: Lipase: 23

## 2010-12-29 LAB — URINALYSIS, ROUTINE W REFLEX MICROSCOPIC
Ketones, ur: 40 — AB
Specific Gravity, Urine: 1.033 — ABNORMAL HIGH

## 2010-12-30 LAB — POCT I-STAT, CHEM 8
Calcium, Ion: 1.13
Chloride: 107
Glucose, Bld: 103 — ABNORMAL HIGH
HCT: 46
Hemoglobin: 15.6 — ABNORMAL HIGH
Potassium: 3.8

## 2010-12-30 LAB — POCT PREGNANCY, URINE: Preg Test, Ur: NEGATIVE

## 2010-12-30 LAB — CBC
MCHC: 33.5
MCV: 95.6
RDW: 12.8

## 2010-12-30 LAB — DIFFERENTIAL
Basophils Absolute: 0.1
Basophils Relative: 2 — ABNORMAL HIGH
Eosinophils Absolute: 0.1
Monocytes Absolute: 0.4
Neutro Abs: 4.2
Neutrophils Relative %: 62

## 2010-12-30 LAB — URINALYSIS, ROUTINE W REFLEX MICROSCOPIC
Nitrite: NEGATIVE
Protein, ur: NEGATIVE
Urobilinogen, UA: 0.2

## 2011-01-13 LAB — URINALYSIS, ROUTINE W REFLEX MICROSCOPIC
Bilirubin Urine: NEGATIVE
Ketones, ur: NEGATIVE
Leukocytes, UA: NEGATIVE
Nitrite: NEGATIVE
Urobilinogen, UA: 0.2
pH: 5.5

## 2011-01-13 LAB — URINE MICROSCOPIC-ADD ON

## 2011-02-08 DIAGNOSIS — K449 Diaphragmatic hernia without obstruction or gangrene: Secondary | ICD-10-CM

## 2011-02-09 ENCOUNTER — Ambulatory Visit (INDEPENDENT_AMBULATORY_CARE_PROVIDER_SITE_OTHER): Payer: 59 | Admitting: Internal Medicine

## 2011-02-09 ENCOUNTER — Encounter: Payer: Self-pay | Admitting: Internal Medicine

## 2011-02-09 DIAGNOSIS — S39011A Strain of muscle, fascia and tendon of abdomen, initial encounter: Secondary | ICD-10-CM

## 2011-02-09 DIAGNOSIS — IMO0002 Reserved for concepts with insufficient information to code with codable children: Secondary | ICD-10-CM

## 2011-02-09 DIAGNOSIS — R1032 Left lower quadrant pain: Secondary | ICD-10-CM

## 2011-02-09 MED ORDER — DICLOFENAC SODIUM 75 MG PO TBEC: 75.0000 mg | DELAYED_RELEASE_TABLET | Freq: Two times a day (BID) | ORAL | Status: DC

## 2011-02-09 NOTE — Patient Instructions (Signed)
It was good to see you today. No evidence for any hernia or stomach problems - suspect pain is due to strain of your muscles - Use diclofenac for pain symptoms as discussed - Your prescription(s) have been submitted to your pharmacy. Please take as directed and contact our office if you believe you are having problem(s) with the medication(s).

## 2011-02-09 NOTE — Progress Notes (Signed)
  Subjective:    Patient ID: Paula Parker, female    DOB: Oct 30, 1960, 50 y.o.   MRN: [id number]  HPI  complains of LLQ discomfort - ? Knot felt yesterday - gone today Onset 48h ago - precipitated by housekeeping - mopping, lifting (more than usual) Worse with position changes - sitting forward worse, better with lying down Tender to palpation No fever, no bowel changes - no nausea and vomiting No dysuria -   Past Medical History  Diagnosis Date  . Alcoholic fatty liver   . GLAUCOMA, BORDERLINE   . HYPERLIPIDEMIA   . HYPERTENSION   . PALPITATIONS, OCCASIONAL   . SMOKER      Review of Systems  Constitutional: Negative for fever and fatigue.  Respiratory: Negative for cough and shortness of breath.   Cardiovascular: Negative for chest pain and palpitations.       Objective:   Physical Exam BP 122/88  Pulse 93  Temp(Src) 98.7 F (37.1 C) (Oral)  SpO2 99% Wt Readings from Last 3 Encounters:  05/13/10 160 lb (72.576 kg)  04/28/10 157 lb (71.215 kg)  04/16/10 155 lb (70.308 kg)   Constitutional: She appears well-developed and well-nourished. No distress.  Cardiovascular: Normal rate, regular rhythm and normal heart sounds.  No murmur heard. No BLE edema. Pulmonary/Chest: Effort normal and breath sounds normal. No respiratory distress. She has no wheezes.  Abdominal: Soft. Bowel sounds are normal. She exhibits no distension. There is no tenderness. no masses or hernia Skin - no rash, bruise or shingles      Assessment & Plan:  LLQ pain - Mskel suspected from overuse (mopping), exam otherwise benign - reassurance provided, no hernia - tx with NSAIDs x 1 week - call if symptoms worse or unimproved

## 2011-06-09 ENCOUNTER — Emergency Department (HOSPITAL_COMMUNITY)
Admission: EM | Admit: 2011-06-09 | Discharge: 2011-06-09 | Disposition: A | Discharge status: Home / Self Care | Payer: 59 | Attending: Emergency Medicine | Admitting: Emergency Medicine

## 2011-06-09 ENCOUNTER — Encounter (HOSPITAL_COMMUNITY): Payer: Self-pay | Admitting: *Deleted

## 2011-06-09 DIAGNOSIS — R002 Palpitations: Secondary | ICD-10-CM | POA: Insufficient documentation

## 2011-06-09 DIAGNOSIS — I10 Essential (primary) hypertension: Secondary | ICD-10-CM | POA: Insufficient documentation

## 2011-06-09 DIAGNOSIS — K700 Alcoholic fatty liver: Secondary | ICD-10-CM | POA: Insufficient documentation

## 2011-06-09 DIAGNOSIS — S0003XA Contusion of scalp, initial encounter: Secondary | ICD-10-CM | POA: Insufficient documentation

## 2011-06-09 DIAGNOSIS — S0990XA Unspecified injury of head, initial encounter: Secondary | ICD-10-CM

## 2011-06-09 DIAGNOSIS — E785 Hyperlipidemia, unspecified: Secondary | ICD-10-CM | POA: Insufficient documentation

## 2011-06-09 DIAGNOSIS — H409 Unspecified glaucoma: Secondary | ICD-10-CM | POA: Insufficient documentation

## 2011-06-09 DIAGNOSIS — S0083XA Contusion of other part of head, initial encounter: Secondary | ICD-10-CM | POA: Insufficient documentation

## 2011-06-09 DIAGNOSIS — Y92009 Unspecified place in unspecified non-institutional (private) residence as the place of occurrence of the external cause: Secondary | ICD-10-CM | POA: Insufficient documentation

## 2011-06-09 DIAGNOSIS — F17200 Nicotine dependence, unspecified, uncomplicated: Secondary | ICD-10-CM | POA: Insufficient documentation

## 2011-06-09 DIAGNOSIS — IMO0002 Reserved for concepts with insufficient information to code with codable children: Secondary | ICD-10-CM | POA: Insufficient documentation

## 2011-06-09 MED ORDER — NAPROXEN 500 MG PO TABS: 500.0000 mg | ORAL_TABLET | Freq: Two times a day (BID) | ORAL | Status: DC

## 2011-06-09 MED ORDER — NAPROXEN 375 MG PO TABS
375.0000 mg | ORAL_TABLET | ORAL | Status: AC
  Administered 2011-06-09: 375 mg via ORAL
  Filled 2011-06-09: qty 1

## 2011-06-09 NOTE — ED Notes (Signed)
Pts yound dog jumped off couch and collided with her.  Pt was struck next to her right eye and appears to have a very small, superficial puncture wound.  Her dog is up to date on his rabies vaccine and pt last tetanus shot was 2 years ago.  No LOC.  Pt has been having some nausea with this.  No changes in vision.  Pt is alert and oriented but states that she still feels "out of it".

## 2011-06-09 NOTE — ED Provider Notes (Signed)
History     CSN: [id number]  Arrival date & time 06/09/11  0714   First MD Initiated Contact with Patient 06/09/11 [phone]      Chief Complaint  Patient presents with  . Headache    HPI The patient sustained a minor head injury this morning. While she was getting her dog jumped off the couch in their heads collided. Patient was struck above the right eye. She sustained a contusion and also noticed a small spot of blood. Patient was not bitten or scratched. The injury occurred from the striking of their heads. Her last tetanus shot was 2 years ago. She did not lose consciousness but did feel slightly nauseated after it occurred.  She denies any change in her vision. She has not had any vomiting. She does feel slightly dazed after the injury. The pain is a 5/10. It increases with palpation. Past Medical History  Diagnosis Date  . Alcoholic fatty liver   . GLAUCOMA, BORDERLINE   . HYPERLIPIDEMIA   . HYPERTENSION   . PALPITATIONS, OCCASIONAL   . SMOKER     History reviewed. No pertinent past surgical history.  Family History  Problem Relation Age of Onset  . Coronary artery disease Other     female first degree <50    History  Substance Use Topics  . Smoking status: Current Everyday Smoker  . Smokeless tobacco: Not on file  . Alcohol Use: Yes    OB History    Grav Para Term Preterm Abortions TAB SAB Ect Mult Living                  Review of Systems  All other systems reviewed and are negative.    Allergies  Doxycycline; Benzonatate; Sulfonamide derivatives; and Penicillins  Home Medications  No current outpatient prescriptions on file.  BP 150/95  Pulse 94  Temp(Src) 97.8 F (36.6 C) (Oral)  Resp 16  SpO2 96%  Physical Exam  Nursing note and vitals reviewed. Constitutional: She appears well-developed and well-nourished. No distress.  HENT:  Head: Normocephalic. Head is with contusion. Head is without raccoon's eyes, without Battle's sign and without  laceration.    Right Ear: External ear normal.  Left Ear: External ear normal.       No step off, no crepitus  Eyes: Conjunctivae are normal. Right eye exhibits no discharge. Left eye exhibits no discharge. No scleral icterus.  Neck: Neck supple. No tracheal deviation present.  Cardiovascular: Normal rate and regular rhythm.   Pulmonary/Chest: Effort normal and breath sounds normal. No stridor. No respiratory distress.  Musculoskeletal: She exhibits no edema.       Cervical back: Normal.  Neurological: She is alert. Cranial nerve deficit: no gross deficits.  Skin: Skin is warm and dry. No rash noted.  Psychiatric: She has a normal mood and affect.    ED Course  Procedures (including critical care time)  Labs Reviewed - No data to display No results found.    MDM  Injuries consistent with a contusion. Low suspicion for significant head injury. The mechanism is unlikely to cause internal bleeding. I doubt facial bone fracture. I recommend ice and nonsteroidals. I did instruct the patient returned emergency room if  she has worsening symptoms or starts to vomit. It is explained to her that I think the chance of having a facial bone fracture is very low, however there is a possibility of a hairline fracture but that the treatment would not change.  Kathalene Frames, MD 06/09/11 [phone]

## 2011-06-09 NOTE — Discharge Instructions (Signed)
Blunt Trauma You have been evaluated for injuries. You have been examined and your caregiver has not found injuries serious enough to require hospitalization. It is common to have multiple bruises and sore muscles following an accident. These tend to feel worse for the first 24 hours. You will feel more stiffness and soreness over the next several hours and worse when you wake up the first morning after your accident. After this point, you should begin to improve with each passing day. The amount of improvement depends on the amount of damage done in the accident. Following your accident, if some part of your body does not work as it should, or if the pain in any area continues to increase, you should return to the Emergency Department for re-evaluation.  HOME CARE INSTRUCTIONS  Routine care for sore areas should include:  Ice to sore areas every 2 hours for 20 minutes while awake for the next 2 days.   Drink extra fluids (not alcohol).   Take a hot or warm shower or bath once or twice a day to increase blood flow to sore muscles. This will help you "limber up".   Activity as tolerated. Lifting may aggravate neck or back pain.   Only take over-the-counter or prescription medicines for pain, discomfort, or fever as directed by your caregiver. Do not use aspirin. This may increase bruising or increase bleeding if there are small areas where this is happening.  SEEK IMMEDIATE MEDICAL CARE IF:  Numbness, tingling, weakness, or problem with the use of your arms or legs.   A severe headache is not relieved with medications.   There is a change in bowel or bladder control.   Increasing pain in any areas of the body.   Short of breath or dizzy.   Nauseated, vomiting, or sweating.   Increasing belly (abdominal) discomfort.   Blood in urine, stool, or vomiting blood.   Pain in either shoulder in an area where a shoulder strap would be.   Feelings of lightheadedness or if you have a fainting  episode.  Sometimes it is not possible to identify all injuries immediately after the trauma. It is important that you continue to monitor your condition after the emergency department visit. If you feel you are not improving, or improving more slowly than should be expected, call your physician. If you feel your symptoms (problems) are worsening, return to the Emergency Department immediately. Document Released: 12/16/2000 Document Revised: 03/11/2011 Document Reviewed: 11/08/2007 [facility] Patient Information 2012 [place].Head Injury, Adult You have had a head injury that does not appear serious at this time. A concussion is a state of changed mental ability, usually from a blow to the head. You should take clear liquids for the rest of the day and then resume your regular diet. You should not take sedatives or alcoholic beverages for as long as directed by your caregiver after discharge. After injuries such as yours, most problems occur within the first 24 hours. SYMPTOMS These minor symptoms may be experienced after discharge:  Memory difficulties.   Dizziness.   Headaches.   Double vision.   Hearing difficulties.   Depression.   Tiredness.   Weakness.   Difficulty with concentration.  If you experience any of these problems, you should not be alarmed. A concussion requires a few days for recovery. Many patients with head injuries frequently experience such symptoms. Usually, these problems disappear without medical care. If symptoms last for more than one day, notify your caregiver. See your caregiver  sooner if symptoms are becoming worse rather than better. HOME CARE INSTRUCTIONS   During the next 24 hours you must stay with someone who can watch you for the warning signs listed below.  Although it is unlikely that serious side effects will occur, you should be aware of signs and symptoms which may necessitate your return to this location. Side effects may occur up to 7 - 10  days following the injury. It is important for you to carefully monitor your condition and contact your caregiver or seek immediate medical attention if there is a change in your condition. SEEK IMMEDIATE MEDICAL CARE IF:   There is confusion or drowsiness.   You can not awaken the injured person.   There is nausea (feeling sick to your stomach) or continued, forceful vomiting.   You notice dizziness or unsteadiness which is getting worse, or inability to walk.   You have convulsions or unconsciousness.   You experience severe, persistent headaches not relieved by over-the-counter or prescription medicines for pain. (Do not take aspirin as this impairs clotting abilities). Take other pain medications only as directed.   You can not use arms or legs normally.   There is clear or bloody discharge from the nose or ears.  MAKE SURE YOU:   Understand these instructions.   Will watch your condition.   Will get help right away if you are not doing well or get worse.  Document Released: 03/22/2005 Document Revised: 03/11/2011 Document Reviewed: 02/07/2009 [facility] Patient Information 2012 [place].

## 2011-08-25 ENCOUNTER — Telehealth: Payer: Self-pay | Admitting: Internal Medicine

## 2011-08-25 NOTE — Telephone Encounter (Signed)
sure

## 2011-08-25 NOTE — Telephone Encounter (Signed)
Called pt to make apt, no answer on all numbers listed.  Voice mail was full.

## 2011-08-25 NOTE — Telephone Encounter (Signed)
The pt called and is hoping to get an appt with you tomorrow for a cough.  Do you want her double booked?    [phone]

## 2011-08-26 ENCOUNTER — Encounter: Payer: Self-pay | Admitting: Internal Medicine

## 2011-08-26 ENCOUNTER — Ambulatory Visit (INDEPENDENT_AMBULATORY_CARE_PROVIDER_SITE_OTHER): Payer: 59 | Admitting: Internal Medicine

## 2011-08-26 ENCOUNTER — Ambulatory Visit (INDEPENDENT_AMBULATORY_CARE_PROVIDER_SITE_OTHER)
Admission: RE | Admit: 2011-08-26 | Discharge: 2011-08-26 | Disposition: A | Discharge status: Home / Self Care | Payer: 59 | Source: Ambulatory Visit | Attending: Internal Medicine | Admitting: Internal Medicine

## 2011-08-26 VITALS — BP 132/86 | HR 80 | Temp 98.4°F | Resp 16 | Wt 132.0 lb

## 2011-08-26 DIAGNOSIS — R059 Cough, unspecified: Secondary | ICD-10-CM | POA: Insufficient documentation

## 2011-08-26 DIAGNOSIS — R05 Cough: Secondary | ICD-10-CM

## 2011-08-26 DIAGNOSIS — J209 Acute bronchitis, unspecified: Secondary | ICD-10-CM

## 2011-08-26 MED ORDER — HYDROCOD POLST-CPM POLST ER 10-8 MG PO CP12: 1.0000 | ORAL_CAPSULE | Freq: Two times a day (BID) | ORAL | Status: DC | PRN

## 2011-08-26 MED ORDER — AZITHROMYCIN 500 MG PO TABS: 500.0000 mg | ORAL_TABLET | Freq: Every day | ORAL | Status: DC

## 2011-08-26 NOTE — Assessment & Plan Note (Signed)
I will check her chest xray to look for mass, pna, edema

## 2011-08-26 NOTE — Assessment & Plan Note (Signed)
Start zpak for the infection and a cough suppressant

## 2011-08-26 NOTE — Patient Instructions (Signed)
Acute Bronchitis You have acute bronchitis. This means you have a chest cold. The airways in your lungs are red and sore (inflamed). Acute means it is sudden onset.  CAUSES Bronchitis is most often caused by the same virus that causes a cold. SYMPTOMS   Body aches.   Chest congestion.   Chills.   Cough.   Fever.   Shortness of breath.   Sore throat.  TREATMENT  Acute bronchitis is usually treated with rest, fluids, and medicines for relief of fever or cough. Most symptoms should go away after a few days or a week. Increased fluids may help thin your secretions and will prevent dehydration. Your caregiver may give you an inhaler to improve your symptoms. The inhaler reduces shortness of breath and helps control cough. You can take over-the-counter pain relievers or cough medicine to decrease coughing, pain, or fever. A cool-air vaporizer may help thin bronchial secretions and make it easier to clear your chest. Antibiotics are usually not needed but can be prescribed if you smoke, are seriously ill, have chronic lung problems, are elderly, or you are at higher risk for developing complications.Allergies and asthma can make bronchitis worse. Repeated episodes of bronchitis may cause longstanding lung problems. Avoid smoking and secondhand smoke.Exposure to cigarette smoke or irritating chemicals will make bronchitis worse. If you are a cigarette smoker, consider using nicotine gum or skin patches to help control withdrawal symptoms. Quitting smoking will help your lungs heal faster. Recovery from bronchitis is often slow, but you should start feeling better after 2 to 3 days. Cough from bronchitis frequently lasts for 3 to 4 weeks. To prevent another bout of acute bronchitis:  Quit smoking.   Wash your hands frequently to get rid of viruses or use a hand sanitizer.   Avoid other people with cold or virus symptoms.   Try not to touch your hands to your mouth, nose, or eyes.  SEEK  IMMEDIATE MEDICAL CARE IF:  You develop increased fever, chills, or chest pain.   You have severe shortness of breath or bloody sputum.   You develop dehydration, fainting, repeated vomiting, or a severe headache.   You have no improvement after 1 week of treatment or you get worse.  MAKE SURE YOU:   Understand these instructions.   Will watch your condition.   Will get help right away if you are not doing well or get worse.  Document Released: 04/29/2004 Document Revised: 03/11/2011 Document Reviewed: 07/15/2010 [facility] Patient Information 2012 [place].

## 2011-08-26 NOTE — Progress Notes (Signed)
Subjective:    Patient ID: Paula Parker, female    DOB: 1960-08-15, 51 y.o.   MRN: [id number]  Cough This is a new problem. The current episode started in the past 7 days. The problem has been unchanged. The problem occurs every few hours. The cough is productive of purulent sputum. Associated symptoms include chills and a sore throat. Pertinent negatives include no chest pain, ear congestion, ear pain, fever, headaches, heartburn, hemoptysis, myalgias, nasal congestion, postnasal drip, rash, rhinorrhea, shortness of breath, sweats, weight loss or wheezing. Risk factors for lung disease include smoking/tobacco exposure. She has tried nothing for the symptoms.      Review of Systems  Constitutional: Positive for chills. Negative for fever, weight loss, diaphoresis, activity change, appetite change, fatigue and unexpected weight change.  HENT: Positive for sore throat. Negative for ear pain, rhinorrhea and postnasal drip.   Eyes: Negative.   Respiratory: Positive for cough. Negative for apnea, hemoptysis, choking, chest tightness, shortness of breath, wheezing and stridor.   Cardiovascular: Negative for chest pain, palpitations and leg swelling.  Gastrointestinal: Negative for heartburn, nausea, vomiting, abdominal pain, diarrhea, constipation and anal bleeding.  Genitourinary: Negative.   Musculoskeletal: Negative for myalgias, back pain, joint swelling, arthralgias and gait problem.  Skin: Negative for color change, pallor, rash and wound.  Neurological: Negative.  Negative for headaches.  Hematological: Negative for adenopathy. Does not bruise/bleed easily.  Psychiatric/Behavioral: Negative.        Objective:   Physical Exam  Vitals reviewed. Constitutional: She is oriented to person, place, and time. She appears well-developed and well-nourished. No distress.  HENT:  Head: Normocephalic and atraumatic. No trismus in the jaw.  Right Ear: Hearing, tympanic membrane, external ear and  ear canal normal.  Left Ear: Hearing, tympanic membrane, external ear and ear canal normal.  Nose: No mucosal edema, rhinorrhea, nose lacerations, sinus tenderness, nasal deformity, septal deviation or nasal septal hematoma. No epistaxis.  No foreign bodies. Right sinus exhibits no maxillary sinus tenderness and no frontal sinus tenderness. Left sinus exhibits no maxillary sinus tenderness and no frontal sinus tenderness.  Mouth/Throat: Mucous membranes are normal. Mucous membranes are not pale, not dry and not cyanotic. No uvula swelling. Posterior oropharyngeal erythema present. No oropharyngeal exudate, posterior oropharyngeal edema or tonsillar abscesses.  Eyes: Conjunctivae are normal. Right eye exhibits no discharge. Left eye exhibits no discharge. No scleral icterus.  Neck: Normal range of motion. Neck supple. No JVD present. No tracheal deviation present. No thyromegaly present.  Cardiovascular: Normal rate, regular rhythm, normal heart sounds and intact distal pulses.  Exam reveals no gallop and no friction rub.   No murmur heard. Pulmonary/Chest: Effort normal and breath sounds normal. No stridor. No respiratory distress. She has no wheezes. She has no rales. She exhibits no tenderness.  Abdominal: Soft. Bowel sounds are normal. She exhibits no distension and no mass. There is no tenderness. There is no rebound and no guarding.  Musculoskeletal: Normal range of motion. She exhibits no edema and no tenderness.  Lymphadenopathy:    She has no cervical adenopathy.  Neurological: She is oriented to person, place, and time.  Skin: Skin is warm and dry. No rash noted. She is not diaphoretic. No erythema. No pallor.  Psychiatric: She has a normal mood and affect. Her behavior is normal. Judgment and thought content normal.      Lab Results  Component Value Date   WBC 5.5 04/16/2010   HGB 13.9 04/16/2010   HCT 41.2 04/16/2010  PLT 295.0 04/16/2010   GLUCOSE 89 04/16/2010   CHOL 175 04/16/2010    TRIG 36.0 04/16/2010   HDL 67.30 04/16/2010   LDLCALC 101* 04/16/2010   ALT 18 04/16/2010   AST 22 04/16/2010   NA 138 04/16/2010   K 4.5 04/16/2010   CL 103 04/16/2010   CREATININE 0.8 04/16/2010   BUN 18 04/16/2010   CO2 28 04/16/2010   TSH 1.90 04/16/2010      Assessment & Plan:

## 2011-12-31 ENCOUNTER — Encounter: Payer: Self-pay | Admitting: Internal Medicine

## 2011-12-31 ENCOUNTER — Ambulatory Visit (INDEPENDENT_AMBULATORY_CARE_PROVIDER_SITE_OTHER): Payer: 59 | Admitting: Internal Medicine

## 2011-12-31 VITALS — BP 140/90 | HR 96 | Temp 98.1°F | Ht 60.0 in | Wt 122.2 lb

## 2011-12-31 DIAGNOSIS — I10 Essential (primary) hypertension: Secondary | ICD-10-CM

## 2011-12-31 DIAGNOSIS — F17200 Nicotine dependence, unspecified, uncomplicated: Secondary | ICD-10-CM

## 2011-12-31 DIAGNOSIS — J209 Acute bronchitis, unspecified: Secondary | ICD-10-CM

## 2011-12-31 DIAGNOSIS — J329 Chronic sinusitis, unspecified: Secondary | ICD-10-CM

## 2011-12-31 MED ORDER — PHENYLEPH-PROMETHAZINE-COD 5-6.25-10 MG/5ML PO SYRP: 5.0000 mL | ORAL_SOLUTION | ORAL | Status: DC | PRN

## 2011-12-31 MED ORDER — AZITHROMYCIN 500 MG PO TABS: 500.0000 mg | ORAL_TABLET | Freq: Every day | ORAL | Status: AC

## 2011-12-31 NOTE — Progress Notes (Signed)
  Subjective:    Patient ID: Paula Parker, female    DOB: 1960-07-01, 51 y.o.   MRN: [id number]  Cough This is a new problem. The current episode started 1 to 4 weeks ago. The problem has been gradually worsening. The problem occurs every few minutes. The cough is productive of sputum. Associated symptoms include chills, ear congestion, headaches, myalgias, nasal congestion and postnasal drip. Pertinent negatives include no chest pain, ear pain, fever, hemoptysis, rash, rhinorrhea, sore throat, shortness of breath, weight loss or wheezing. The symptoms are aggravated by lying down. Risk factors for lung disease include smoking/tobacco exposure. She has tried OTC cough suppressant for the symptoms. The treatment provided no relief. There is no history of asthma, bronchitis or COPD.    Past Medical History  Diagnosis Date  . Alcoholic fatty liver   . GLAUCOMA, BORDERLINE   . HYPERLIPIDEMIA   . HYPERTENSION   . PALPITATIONS, OCCASIONAL   . SMOKER      Review of Systems  Constitutional: Positive for chills. Negative for fever and weight loss.  HENT: Positive for postnasal drip. Negative for ear pain, sore throat and rhinorrhea.   Respiratory: Positive for cough. Negative for hemoptysis, shortness of breath and wheezing.   Cardiovascular: Negative for chest pain.  Musculoskeletal: Positive for myalgias.  Skin: Negative for rash.  Neurological: Positive for headaches.       Objective:   Physical Exam BP 140/90  Pulse 96  Temp 98.1 F (36.7 C) (Oral)  Ht 5' (1.524 m)  Wt 122 lb 4 oz (55.452 kg)  BMI 23.88 kg/m2  SpO2 97%  GEN: mildly ill appearing and audible head/chest congestion HENT: NCAT, mild sinus tenderness bilaterally, nares with thick clear discharge, oropharynx mild erythema, no exudate Eyes: Vision grossly intact, no conjunctivitis Lungs: bilateral rhonchi but no wheeze, no increased work of breathing Cardiovascular: Regular rate and rhythm, no bilateral edema  Lab  Results  Component Value Date   WBC 5.5 04/16/2010   HGB 13.9 04/16/2010   HCT 41.2 04/16/2010   PLT 295.0 04/16/2010   GLUCOSE 89 04/16/2010   CHOL 175 04/16/2010   TRIG 36.0 04/16/2010   HDL 67.30 04/16/2010   LDLCALC 101* 04/16/2010   ALT 18 04/16/2010   AST 22 04/16/2010   NA 138 04/16/2010   K 4.5 04/16/2010   CL 103 04/16/2010   CREATININE 0.8 04/16/2010   BUN 18 04/16/2010   CO2 28 04/16/2010   TSH 1.90 04/16/2010   Dg Chest 2 View  08/26/2011  *RADIOLOGY REPORT*  Clinical Data: History of smoking with cough.  CHEST - 2 VIEW  Comparison: 02/07/2006  Findings: Two views of the chest were obtained.  Lungs are clear without focal disease. Heart and mediastinum are within normal limits.  The trachea is midline.  The bony structures are intact.  IMPRESSION: Stable chest radiograph findings.  No acute changes.  Original Report Authenticated By: Markus Daft, M.D.     Assessment & Plan:  Acute bronchitis sinusitus Cough, postnasal drip related to above Tobacco abuse ongoing   Empiric antibiotics prescribed due to symptom duration greater than 7 days Prescription cough suppression - new prescriptions done Symptomatic care with Tylenol or Advil, hydration and rest -  salt gargle advised as needed  5 minutes today spent counseling patient on unhealthy effects of continued tobacco abuse and encouragement of cessation including medical options available to help the patient quit smoking.

## 2011-12-31 NOTE — Assessment & Plan Note (Signed)
BP Readings from Last 3 Encounters:  12/31/11 140/90  08/26/11 132/86  06/09/11 146/93   Reviewed probable need for med tx of same - pt to follow up with PCP next month as scheduled

## 2011-12-31 NOTE — Patient Instructions (Signed)
It was good to see you today. Zpak antibiotics and prescription cough syrup - Your prescription(s) have been submitted to your pharmacy. Please take as directed and contact our office if you believe you are having problem(s) with the medication(s). Alternate between ibuprofen and tylenol for aches, pain and fever symptoms as discussed Hydrate, rest and call if worse or unimproved Quit smoking Acute Bronchitis Bronchitis is when the organs and tissues involved in breathing get puffy (swollen) and can leak fluid. This makes it harder for air to get in and out of the lungs. You may cough a lot and produce thick spit (mucus). Acute means the illness started suddenly. HOME CARE  Rest.   Drink enough fluids to keep the pee (urine) clear or pale yellow.   Medicines may be given that will open up your airways to help you breathe better. Only take medicine as told by your doctor.   Use a cool mist vaporizer. This will help to thin any thick spit.   Do not smoke. Avoid secondhand smoke.  GET HELP RIGHT AWAY IF:    You have a temperature by mouth above 102 F (38.9 C), not controlled by medicine.   You have chills.   You develop severe shortness of breath or chest pain.   You have bloody spit mixed with mucus (sputum).   You throw up (vomit) often.   You lose too much body fluid (dehydrated).   You have a severe headache.   You feel faint.   You do not improve after 1 week of treatment.  MAKE SURE YOU:    Understand these instructions.   Will watch your condition.   Will get help right away if you are not doing well or get worse.  Document Released: 09/08/2007 Document Revised: 03/11/2011 Document Reviewed: 04/09/2009 [facility] Patient Information 2012 [email or web address].You Can Quit Smoking If you are ready to quit smoking or are thinking about it, congratulations! You have chosen to help yourself be healthier and live longer! There are lots of different ways to quit smoking.  Nicotine gum, nicotine patches, a nicotine inhaler, or nicotine nasal spray can help with physical craving. Hypnosis, support groups, and medicines help break the habit of smoking. TIPS TO GET OFF AND STAY OFF CIGARETTES  Learn to predict your moods. Do not let a bad situation be your excuse to have a cigarette. Some situations in your life might tempt you to have a cigarette.   Ask friends and co-workers not to smoke around you.   Make your home smoke-free.   Never have "just one" cigarette. It leads to wanting another and another. Remind yourself of your decision to quit.   On a card, make a list of your reasons for not smoking. Read it at least the same number of times a day as you have a cigarette. Tell yourself everyday, "I do not want to smoke. I choose not to smoke."   Ask someone at home or work to help you with your plan to quit smoking.   Have something planned after you eat or have a cup of coffee. Take a walk or get other exercise to perk you up. This will help to keep you from overeating.   Try a relaxation exercise to calm you down and decrease your stress. Remember, you may be tense and nervous the first two weeks after you quit. This will pass.   Find new activities to keep your hands busy. Play with a pen, coin, or rubber band. Doodle  or draw things on paper.   Brush your teeth right after eating. This will help cut down the craving for the taste of tobacco after meals. You can try mouthwash too.   Try gum, breath mints, or diet candy to keep something in your mouth.  IF YOU SMOKE AND WANT TO QUIT:  Do not stock up on cigarettes. Never buy a carton. Wait until one pack is finished before you buy another.   Never carry cigarettes with you at work or at home.   Keep cigarettes as far away from you as possible. Leave them with someone else.   Never carry matches or a lighter with you.   Ask yourself, "Do I need this cigarette or is this just a reflex?"   Bet with  someone that you can quit. Put cigarette money in a piggy bank every morning. If you smoke, you give up the money. If you do not smoke, by the end of the week, you keep the money.   Keep trying. It takes 21 days to change a habit!   Talk to your doctor about using medicines to help you quit. These include nicotine replacement gum, lozenges, or skin patches.  Document Released: 01/16/2009 Document Revised: 03/11/2011 Document Reviewed: 01/16/2009 [facility] Patient Information 2012 [place].

## 2012-01-24 ENCOUNTER — Other Ambulatory Visit (INDEPENDENT_AMBULATORY_CARE_PROVIDER_SITE_OTHER): Payer: 59

## 2012-01-24 ENCOUNTER — Encounter: Payer: Self-pay | Admitting: Internal Medicine

## 2012-01-24 ENCOUNTER — Ambulatory Visit (INDEPENDENT_AMBULATORY_CARE_PROVIDER_SITE_OTHER)
Admission: RE | Admit: 2012-01-24 | Discharge: 2012-01-24 | Disposition: A | Discharge status: Home / Self Care | Payer: 59 | Source: Ambulatory Visit | Attending: Internal Medicine | Admitting: Internal Medicine

## 2012-01-24 ENCOUNTER — Ambulatory Visit (INDEPENDENT_AMBULATORY_CARE_PROVIDER_SITE_OTHER): Payer: 59 | Admitting: Internal Medicine

## 2012-01-24 VITALS — BP 120/88 | HR 74 | Temp 97.1°F | Resp 16 | Wt 120.0 lb

## 2012-01-24 DIAGNOSIS — R05 Cough: Secondary | ICD-10-CM

## 2012-01-24 DIAGNOSIS — Z01419 Encounter for gynecological examination (general) (routine) without abnormal findings: Secondary | ICD-10-CM | POA: Insufficient documentation

## 2012-01-24 DIAGNOSIS — E785 Hyperlipidemia, unspecified: Secondary | ICD-10-CM

## 2012-01-24 DIAGNOSIS — I10 Essential (primary) hypertension: Secondary | ICD-10-CM

## 2012-01-24 DIAGNOSIS — Z0000 Encounter for general adult medical examination without abnormal findings: Secondary | ICD-10-CM

## 2012-01-24 LAB — COMPREHENSIVE METABOLIC PANEL
Alkaline Phosphatase: 44 U/L (ref 39–117)
BUN: 20 mg/dL (ref 6–23)
CO2: 30 mEq/L (ref 19–32)
Creatinine, Ser: 0.8 mg/dL (ref 0.4–1.2)
GFR: 78.32 mL/min (ref >60.00)
Glucose, Bld: 88 mg/dL (ref 70–99)
Total Bilirubin: 0.2 mg/dL — ABNORMAL LOW (ref 0.3–1.2)
Total Protein: 6.7 g/dL (ref 6.0–8.3)

## 2012-01-24 LAB — CBC WITH DIFFERENTIAL/PLATELET
Basophils Relative: 1 % (ref 0.0–3.0)
Eosinophils Relative: 2.1 % (ref 0.0–5.0)
HCT: 45.7 % (ref 36.0–46.0)
Hemoglobin: 15 g/dL (ref 12.0–15.0)
Lymphs Abs: 1.9 10*3/uL (ref 0.7–4.0)
MCV: 98.5 fl (ref 78.0–100.0)
Monocytes Relative: 8.6 % (ref 3.0–12.0)
Platelets: 230 10*3/uL (ref 150.0–400.0)
RBC: 4.64 Mil/uL (ref 3.87–5.11)
WBC: 5.4 10*3/uL (ref 4.5–10.5)

## 2012-01-24 LAB — LIPID PANEL
Cholesterol: 170 mg/dL (ref 0–200)
HDL: 81.5 mg/dL (ref >39.00)
Triglycerides: 41 mg/dL (ref 0.0–149.0)
VLDL: 8.2 mg/dL (ref 0.0–40.0)

## 2012-01-24 LAB — TSH: TSH: 2.59 u[IU]/mL (ref 0.35–5.50)

## 2012-01-24 NOTE — Assessment & Plan Note (Signed)
GYn referral

## 2012-01-24 NOTE — Patient Instructions (Signed)
Cough, Adult  A cough is a reflex that helps clear your throat and airways. It can help heal the body or may be a reaction to an irritated airway. A cough may only last 2 or 3 weeks (acute) or may last more than 8 weeks (chronic).  CAUSES Acute cough:  Viral or bacterial infections. Chronic cough:  Infections.  Allergies.  Asthma.  Post-nasal drip.  Smoking.  Heartburn or acid reflux.  Some medicines.  Chronic lung problems (COPD).  Cancer. SYMPTOMS   Cough.  Fever.  Chest pain.  Increased breathing rate.  High-pitched whistling sound when breathing (wheezing).  Colored mucus that you cough up (sputum). TREATMENT   A bacterial cough may be treated with antibiotic medicine.  A viral cough must run its course and will not respond to antibiotics.  Your caregiver may recommend other treatments if you have a chronic cough. HOME CARE INSTRUCTIONS   Only take over-the-counter or prescription medicines for pain, discomfort, or fever as directed by your caregiver. Use cough suppressants only as directed by your caregiver.  Use a cold steam vaporizer or humidifier in your bedroom or home to help loosen secretions.  Sleep in a semi-upright position if your cough is worse at night.  Rest as needed.  Stop smoking if you smoke. SEEK IMMEDIATE MEDICAL CARE IF:   You have pus in your sputum.  Your cough starts to worsen.  You cannot control your cough with suppressants and are losing sleep.  You begin coughing up blood.  You have difficulty breathing.  You develop pain which is getting worse or is uncontrolled with medicine.  You have a fever. MAKE SURE YOU:   Understand these instructions.  Will watch your condition.  Will get help right away if you are not doing well or get worse. Document Released: 09/18/2010 Document Revised: 06/14/2011 Document Reviewed: 09/18/2010 [facility] Patient Information 2013 [place]. Preventive Care for Adults,  Female A healthy lifestyle and preventive care can promote health and wellness. Preventive health guidelines for women include the following key practices.  A routine yearly physical is a good way to check with your caregiver about your health and preventive screening. It is a chance to share any concerns and updates on your health, and to receive a thorough exam.  Visit your dentist for a routine exam and preventive care every 6 months. Brush your teeth twice a day and floss once a day. Good oral hygiene prevents tooth decay and gum disease.  The frequency of eye exams is based on your age, health, family medical history, use of contact lenses, and other factors. Follow your caregiver's recommendations for frequency of eye exams.  Eat a healthy diet. Foods like vegetables, fruits, whole grains, low-fat dairy products, and lean protein foods contain the nutrients you need without too many calories. Decrease your intake of foods high in solid fats, added sugars, and salt. Eat the right amount of calories for you.Get information about a proper diet from your caregiver, if necessary.  Regular physical exercise is one of the most important things you can do for your health. Most adults should get at least 150 minutes of moderate-intensity exercise (any activity that increases your heart rate and causes you to sweat) each week. In addition, most adults need muscle-strengthening exercises on 2 or more days a week.  Maintain a healthy weight. The body mass index (BMI) is a screening tool to identify possible weight problems. It provides an estimate of body fat based on height and  weight. Your caregiver can help determine your BMI, and can help you achieve or maintain a healthy weight.For adults 20 years and older:  A BMI below 18.5 is considered underweight.  A BMI of 18.5 to 24.9 is normal.  A BMI of 25 to 29.9 is considered overweight.  A BMI of 30 and above is considered obese.  Maintain normal  blood lipids and cholesterol levels by exercising and minimizing your intake of saturated fat. Eat a balanced diet with plenty of fruit and vegetables. Blood tests for lipids and cholesterol should begin at age 66 and be repeated every 5 years. If your lipid or cholesterol levels are high, you are over 50, or you are at high risk for heart disease, you may need your cholesterol levels checked more frequently.Ongoing high lipid and cholesterol levels should be treated with medicines if diet and exercise are not effective.  If you smoke, find out from your caregiver how to quit. If you do not use tobacco, do not start.  If you are pregnant, do not drink alcohol. If you are breastfeeding, be very cautious about drinking alcohol. If you are not pregnant and choose to drink alcohol, do not exceed 1 drink per day. One drink is considered to be 12 ounces (355 mL) of beer, 5 ounces (148 mL) of wine, or 1.5 ounces (44 mL) of liquor.  Avoid use of street drugs. Do not share needles with anyone. Ask for help if you need support or instructions about stopping the use of drugs.  High blood pressure causes heart disease and increases the risk of stroke. Your blood pressure should be checked at least every 1 to 2 years. Ongoing high blood pressure should be treated with medicines if weight loss and exercise are not effective.  If you are 5 to 50 years old, ask your caregiver if you should take aspirin to prevent strokes.  Diabetes screening involves taking a blood sample to check your fasting blood sugar level. This should be done once every 3 years, after age 51, if you are within normal weight and without risk factors for diabetes. Testing should be considered at a younger age or be carried out more frequently if you are overweight and have at least 1 risk factor for diabetes.  Breast cancer screening is essential preventive care for women. You should practice "breast self-awareness." This means understanding the  normal appearance and feel of your breasts and may include breast self-examination. Any changes detected, no matter how small, should be reported to a caregiver. Women in their 60s and 30s should have a clinical breast exam (CBE) by a caregiver as part of a regular health exam every 1 to 3 years. After age 42, women should have a CBE every year. Starting at age 38, women should consider having a mammography (breast X-ray test) every year. Women who have a family history of breast cancer should talk to their caregiver about genetic screening. Women at a high risk of breast cancer should talk to their caregivers about having magnetic resonance imaging (MRI) and a mammography every year.  The Pap test is a screening test for cervical cancer. A Pap test can show cell changes on the cervix that might become cervical cancer if left untreated. A Pap test is a procedure in which cells are obtained and examined from the lower end of the uterus (cervix).  Women should have a Pap test starting at age 60.  Between ages 42 and 44, Pap tests should be repeated  every 2 years.  Beginning at age 25, you should have a Pap test every 3 years as long as the past 3 Pap tests have been normal.  Some women have medical problems that increase the chance of getting cervical cancer. Talk to your caregiver about these problems. It is especially important to talk to your caregiver if a new problem develops soon after your last Pap test. In these cases, your caregiver may recommend more frequent screening and Pap tests.  The above recommendations are the same for women who have or have not gotten the vaccine for human papillomavirus (HPV).  If you had a hysterectomy for a problem that was not cancer or a condition that could lead to cancer, then you no longer need Pap tests. Even if you no longer need a Pap test, a regular exam is a good idea to make sure no other problems are starting.  If you are between ages 38 and 77, and  you have had normal Pap tests going back 10 years, you no longer need Pap tests. Even if you no longer need a Pap test, a regular exam is a good idea to make sure no other problems are starting.  If you have had past treatment for cervical cancer or a condition that could lead to cancer, you need Pap tests and screening for cancer for at least 20 years after your treatment.  If Pap tests have been discontinued, risk factors (such as a new sexual partner) need to be reassessed to determine if screening should be resumed.  The HPV test is an additional test that may be used for cervical cancer screening. The HPV test looks for the virus that can cause the cell changes on the cervix. The cells collected during the Pap test can be tested for HPV. The HPV test could be used to screen women aged 22 years and older, and should be used in women of any age who have unclear Pap test results. After the age of 33, women should have HPV testing at the same frequency as a Pap test.  Colorectal cancer can be detected and often prevented. Most routine colorectal cancer screening begins at the age of 33 and continues through age 60. However, your caregiver may recommend screening at an earlier age if you have risk factors for colon cancer. On a yearly basis, your caregiver may provide home test kits to check for hidden blood in the stool. Use of a small camera at the end of a tube, to directly examine the colon (sigmoidoscopy or colonoscopy), can detect the earliest forms of colorectal cancer. Talk to your caregiver about this at age 83, when routine screening begins. Direct examination of the colon should be repeated every 5 to 10 years through age 30, unless early forms of pre-cancerous polyps or small growths are found.  Hepatitis C blood testing is recommended for all people born from [birth year] through 1965 and any individual with known risks for hepatitis C.  Practice safe sex. Use condoms and avoid high-risk sexual  practices to reduce the spread of sexually transmitted infections (STIs). STIs include gonorrhea, chlamydia, syphilis, trichomonas, herpes, HPV, and human immunodeficiency virus (HIV). Herpes, HIV, and HPV are viral illnesses that have no cure. They can result in disability, cancer, and death. Sexually active women aged 7 and younger should be checked for chlamydia. Older women with new or multiple partners should also be tested for chlamydia. Testing for other STIs is recommended if you are sexually active and  at increased risk.  Osteoporosis is a disease in which the bones lose minerals and strength with aging. This can result in serious bone fractures. The risk of osteoporosis can be identified using a bone density scan. Women ages 73 and over and women at risk for fractures or osteoporosis should discuss screening with their caregivers. Ask your caregiver whether you should take a calcium supplement or vitamin D to reduce the rate of osteoporosis.  Menopause can be associated with physical symptoms and risks. Hormone replacement therapy is available to decrease symptoms and risks. You should talk to your caregiver about whether hormone replacement therapy is right for you.  Use sunscreen with sun protection factor (SPF) of 30 or more. Apply sunscreen liberally and repeatedly throughout the day. You should seek shade when your shadow is shorter than you. Protect yourself by wearing long sleeves, pants, a wide-brimmed hat, and sunglasses year round, whenever you are outdoors.  Once a month, do a whole body skin exam, using a mirror to look at the skin on your back. Notify your caregiver of new moles, moles that have irregular borders, moles that are larger than a pencil eraser, or moles that have changed in shape or color.  Stay current with required immunizations.  Influenza. You need a dose every fall (or winter). The composition of the flu vaccine changes each year, so being vaccinated once is not  enough.  Pneumococcal polysaccharide. You need 1 to 2 doses if you smoke cigarettes or if you have certain chronic medical conditions. You need 1 dose at age 69 (or older) if you have never been vaccinated.  Tetanus, diphtheria, pertussis (Tdap, Td). Get 1 dose of Tdap vaccine if you are younger than age 75, are over 26 and have contact with an infant, are a Dietitian, are pregnant, or simply want to be protected from whooping cough. After that, you need a Td booster dose every 10 years. Consult your caregiver if you have not had at least 3 tetanus and diphtheria-containing shots sometime in your life or have a deep or dirty wound.  HPV. You need this vaccine if you are a woman age 17 or younger. The vaccine is given in 3 doses over 6 months.  Measles, mumps, rubella (MMR). You need at least 1 dose of MMR if you were born in 1957 or later. You may also need a second dose.  Meningococcal. If you are age 50 to 5 and a first-year college student living in a residence hall, or have one of several medical conditions, you need to get vaccinated against meningococcal disease. You may also need additional booster doses.  Zoster (shingles). If you are age 40 or older, you should get this vaccine.  Varicella (chickenpox). If you have never had chickenpox or you were vaccinated but received only 1 dose, talk to your caregiver to find out if you need this vaccine.  Hepatitis A. You need this vaccine if you have a specific risk factor for hepatitis A virus infection or you simply wish to be protected from this disease. The vaccine is usually given as 2 doses, 6 to 18 months apart.  Hepatitis B. You need this vaccine if you have a specific risk factor for hepatitis B virus infection or you simply wish to be protected from this disease. The vaccine is given in 3 doses, usually over 6 months. Preventive Services / Frequency Ages 40 to 69  Blood pressure check.** / Every 1 to 2 years.  Lipid and  cholesterol check.** / Every 5 years beginning at age 10.  Clinical breast exam.** / Every 3 years for women in their 76s and 58s.  Pap test.** / Every 2 years from ages 65 through 55. Every 3 years starting at age 71 through age 81 or 43 with a history of 3 consecutive normal Pap tests.  HPV screening.** / Every 3 years from ages 80 through ages 39 to 33 with a history of 3 consecutive normal Pap tests.  Hepatitis C blood test.** / For any individual with known risks for hepatitis C.  Skin self-exam. / Monthly.  Influenza immunization.** / Every year.  Pneumococcal polysaccharide immunization.** / 1 to 2 doses if you smoke cigarettes or if you have certain chronic medical conditions.  Tetanus, diphtheria, pertussis (Tdap, Td) immunization. / A one-time dose of Tdap vaccine. After that, you need a Td booster dose every 10 years.  HPV immunization. / 3 doses over 6 months, if you are 45 and younger.  Measles, mumps, rubella (MMR) immunization. / You need at least 1 dose of MMR if you were born in 1957 or later. You may also need a second dose.  Meningococcal immunization. / 1 dose if you are age 9 to 33 and a first-year college student living in a residence hall, or have one of several medical conditions, you need to get vaccinated against meningococcal disease. You may also need additional booster doses.  Varicella immunization.** / Consult your caregiver.  Hepatitis A immunization.** / Consult your caregiver. 2 doses, 6 to 18 months apart.  Hepatitis B immunization.** / Consult your caregiver. 3 doses usually over 6 months. Ages 68 to 34  Blood pressure check.** / Every 1 to 2 years.  Lipid and cholesterol check.** / Every 5 years beginning at age 40.  Clinical breast exam.** / Every year after age 22.  Mammogram.** / Every year beginning at age 67 and continuing for as long as you are in good health. Consult with your caregiver.  Pap test.** / Every 3 years starting at age  51 through age 27 or 54 with a history of 3 consecutive normal Pap tests.  HPV screening.** / Every 3 years from ages 77 through ages 20 to 76 with a history of 3 consecutive normal Pap tests.  Fecal occult blood test (FOBT) of stool. / Every year beginning at age 58 and continuing until age 25. You may not need to do this test if you get a colonoscopy every 10 years.  Flexible sigmoidoscopy or colonoscopy.** / Every 5 years for a flexible sigmoidoscopy or every 10 years for a colonoscopy beginning at age 41 and continuing until age 85.  Hepatitis C blood test.** / For all people born from [birth year] through 1964 and any individual with known risks for hepatitis C.  Skin self-exam. / Monthly.  Influenza immunization.** / Every year.  Pneumococcal polysaccharide immunization.** / 1 to 2 doses if you smoke cigarettes or if you have certain chronic medical conditions.  Tetanus, diphtheria, pertussis (Tdap, Td) immunization.** / A one-time dose of Tdap vaccine. After that, you need a Td booster dose every 10 years.  Measles, mumps, rubella (MMR) immunization. / You need at least 1 dose of MMR if you were born in 1957 or later. You may also need a second dose.  Varicella immunization.** / Consult your caregiver.  Meningococcal immunization.** / Consult your caregiver.  Hepatitis A immunization.** / Consult your caregiver. 2 doses, 6 to 18 months apart.  Hepatitis B immunization.** /  Consult your caregiver. 3 doses, usually over 6 months. Ages 34 and over  Blood pressure check.** / Every 1 to 2 years.  Lipid and cholesterol check.** / Every 5 years beginning at age 31.  Clinical breast exam.** / Every year after age 35.  Mammogram.** / Every year beginning at age 33 and continuing for as long as you are in good health. Consult with your caregiver.  Pap test.** / Every 3 years starting at age 62 through age 33 or 22 with a 3 consecutive normal Pap tests. Testing can be stopped between 65 and  70 with 3 consecutive normal Pap tests and no abnormal Pap or HPV tests in the past 10 years.  HPV screening.** / Every 3 years from ages 60 through ages 69 or 18 with a history of 3 consecutive normal Pap tests. Testing can be stopped between 65 and 70 with 3 consecutive normal Pap tests and no abnormal Pap or HPV tests in the past 10 years.  Fecal occult blood test (FOBT) of stool. / Every year beginning at age 84 and continuing until age 97. You may not need to do this test if you get a colonoscopy every 10 years.  Flexible sigmoidoscopy or colonoscopy.** / Every 5 years for a flexible sigmoidoscopy or every 10 years for a colonoscopy beginning at age 55 and continuing until age 23.  Hepatitis C blood test.** / For all people born from [birth year] through 1964 and any individual with known risks for hepatitis C.  Osteoporosis screening.** / A one-time screening for women ages 73 and over and women at risk for fractures or osteoporosis.  Skin self-exam. / Monthly.  Influenza immunization.** / Every year.  Pneumococcal polysaccharide immunization.** / 1 dose at age 23 (or older) if you have never been vaccinated.  Tetanus, diphtheria, pertussis (Tdap, Td) immunization. / A one-time dose of Tdap vaccine if you are over 65 and have contact with an infant, are a Dietitian, or simply want to be protected from whooping cough. After that, you need a Td booster dose every 10 years.  Varicella immunization.** / Consult your caregiver.  Meningococcal immunization.** / Consult your caregiver.  Hepatitis A immunization.** / Consult your caregiver. 2 doses, 6 to 18 months apart.  Hepatitis B immunization.** / Check with your caregiver. 3 doses, usually over 6 months. ** Family history and personal history of risk and conditions may change your caregiver's recommendations. Document Released: 05/18/2001 Document Revised: 06/14/2011 Document Reviewed: 08/17/2010 [facility] Patient Information 2013  [place].

## 2012-01-24 NOTE — Assessment & Plan Note (Signed)
Exam done, vaccines were reviewed, labs ordered, pt ed material was given

## 2012-01-24 NOTE — Progress Notes (Signed)
Subjective:    Patient ID: Paula Parker, female    DOB: 03-18-1961, 51 y.o.   MRN: [id number]  Cough This is a chronic problem. The current episode started more than 1 month ago. The problem has been unchanged. The problem occurs every few hours. The cough is productive of sputum. Pertinent negatives include no chest pain, chills, ear congestion, ear pain, fever, headaches, heartburn, hemoptysis, myalgias, nasal congestion, postnasal drip, rash, rhinorrhea, sore throat, shortness of breath, sweats, weight loss or wheezing. The symptoms are aggravated by fumes. Risk factors for lung disease include smoking/tobacco exposure. She has tried nothing for the symptoms. The treatment provided no relief.      Review of Systems  Constitutional: Positive for unexpected weight change (weight loss). Negative for fever, chills, weight loss, diaphoresis, activity change, appetite change and fatigue.  HENT: Negative.  Negative for ear pain, sore throat, rhinorrhea, trouble swallowing, voice change and postnasal drip.   Eyes: Negative.   Respiratory: Positive for cough. Negative for apnea, hemoptysis, choking, chest tightness, shortness of breath, wheezing and stridor.   Cardiovascular: Negative for chest pain, palpitations and leg swelling.  Gastrointestinal: Negative for heartburn, nausea, vomiting, abdominal pain, diarrhea, constipation and blood in stool.  Genitourinary: Negative.   Musculoskeletal: Negative for myalgias, back pain, joint swelling, arthralgias and gait problem.  Skin: Negative for color change, pallor, rash and wound.  Neurological: Negative for dizziness, tremors, seizures, syncope, facial asymmetry, speech difficulty, weakness, light-headedness, numbness and headaches.  Hematological: Negative for adenopathy. Does not bruise/bleed easily.  Psychiatric/Behavioral: Negative.        Objective:   Physical Exam  Vitals reviewed. Constitutional: She is oriented to person, place, and  time. She appears well-developed and well-nourished.  Non-toxic appearance. She does not have a sickly appearance. She does not appear ill. No distress.  HENT:  Head: Normocephalic and atraumatic.  Mouth/Throat: Oropharynx is clear and moist. No oropharyngeal exudate.  Eyes: Conjunctivae normal are normal. Right eye exhibits no discharge. Left eye exhibits no discharge. No scleral icterus.  Neck: Normal range of motion. Neck supple. No JVD present. No tracheal deviation present. No thyromegaly present.  Cardiovascular: Normal rate, regular rhythm, normal heart sounds and intact distal pulses.  Exam reveals no gallop and no friction rub.   No murmur heard. Pulmonary/Chest: Effort normal and breath sounds normal. No stridor. No respiratory distress. She has no wheezes. She has no rales. She exhibits no tenderness.  Abdominal: Soft. Bowel sounds are normal. She exhibits no distension and no mass. There is no tenderness. There is no rebound and no guarding.  Genitourinary: Rectal exam shows external hemorrhoid. Rectal exam shows no internal hemorrhoid, no fissure, no mass, no tenderness and anal tone normal. Guaiac negative stool. No breast swelling, tenderness, discharge or bleeding. Pelvic exam was performed with patient supine.  Musculoskeletal: Normal range of motion. She exhibits no edema and no tenderness.  Lymphadenopathy:    She has no cervical adenopathy.  Neurological: She is oriented to person, place, and time.  Skin: Skin is warm and dry. No rash noted. She is not diaphoretic. No erythema. No pallor.  Psychiatric: She has a normal mood and affect. Her behavior is normal. Judgment and thought content normal.     Lab Results  Component Value Date   WBC 5.5 04/16/2010   HGB 13.9 04/16/2010   HCT 41.2 04/16/2010   PLT 295.0 04/16/2010   GLUCOSE 89 04/16/2010   CHOL 175 04/16/2010   TRIG 36.0 04/16/2010   HDL 67.30  04/16/2010   LDLCALC 101* 04/16/2010   ALT 18 04/16/2010   AST 22 04/16/2010     NA 138 04/16/2010   K 4.5 04/16/2010   CL 103 04/16/2010   CREATININE 0.8 04/16/2010   BUN 18 04/16/2010   CO2 28 04/16/2010   TSH 1.90 04/16/2010       Assessment & Plan:

## 2012-01-24 NOTE — Assessment & Plan Note (Signed)
I will recheck her CXR today, I am concerned that she may have COPD or asthma so I have asked her to see Pulm to consider getting FPT's done

## 2012-01-24 NOTE — Assessment & Plan Note (Signed)
FLP today 

## 2012-01-26 ENCOUNTER — Institutional Professional Consult (permissible substitution): Payer: Self-pay | Admitting: Critical Care Medicine

## 2012-02-03 ENCOUNTER — Encounter: Payer: Self-pay | Admitting: Women's Health

## 2012-02-03 ENCOUNTER — Other Ambulatory Visit (HOSPITAL_COMMUNITY)
Admission: RE | Admit: 2012-02-03 | Discharge: 2012-02-03 | Disposition: A | Discharge status: Home / Self Care | Payer: 59 | Source: Ambulatory Visit | Attending: Women's Health | Admitting: Women's Health

## 2012-02-03 ENCOUNTER — Ambulatory Visit (INDEPENDENT_AMBULATORY_CARE_PROVIDER_SITE_OTHER): Payer: 59 | Admitting: Women's Health

## 2012-02-03 VITALS — BP 126/74 | Ht 61.25 in | Wt 118.0 lb

## 2012-02-03 DIAGNOSIS — N760 Acute vaginitis: Secondary | ICD-10-CM

## 2012-02-03 DIAGNOSIS — Z01419 Encounter for gynecological examination (general) (routine) without abnormal findings: Secondary | ICD-10-CM | POA: Insufficient documentation

## 2012-02-03 DIAGNOSIS — N898 Other specified noninflammatory disorders of vagina: Secondary | ICD-10-CM

## 2012-02-03 DIAGNOSIS — N952 Postmenopausal atrophic vaginitis: Secondary | ICD-10-CM

## 2012-02-03 DIAGNOSIS — Z780 Asymptomatic menopausal state: Secondary | ICD-10-CM

## 2012-02-03 DIAGNOSIS — B9689 Other specified bacterial agents as the cause of diseases classified elsewhere: Secondary | ICD-10-CM

## 2012-02-03 DIAGNOSIS — A499 Bacterial infection, unspecified: Secondary | ICD-10-CM

## 2012-02-03 DIAGNOSIS — Z1151 Encounter for screening for human papillomavirus (HPV): Secondary | ICD-10-CM | POA: Insufficient documentation

## 2012-02-03 LAB — WET PREP FOR TRICH, YEAST, CLUE
Trich, Wet Prep: NONE SEEN
Yeast Wet Prep HPF POC: NONE SEEN

## 2012-02-03 MED ORDER — METRONIDAZOLE 0.75 % VA GEL: VAGINAL | Status: DC

## 2012-02-03 NOTE — Progress Notes (Signed)
Paula Parker 10/31/1960 [id number]    History:    New patient presents for annual exam. Postmenopausal at age 51 with no bleeding after. No HRT. Denies menopausal symptoms other than vaginal dryness. Smoker, half pack per day, history of alcohol abuse, normal lipid panel, comprehensive metabolic panel, TSH, CBC this month at primary care. Reports normal Paps but many years ago. Normal mammogram history, scheduled in November.  Has not had a colonoscopy or DEXA.   Past medical history, past surgical history, family history and social history were all reviewed and documented in the EPIC chart. Works at [employer] in housekeeping. One daughter, 3 grandchildren.   ROS:  A  ROS was performed and pertinent positives and negatives are included in the history.  Exam:  Filed Vitals:   02/03/12 0803  BP: 126/74    General appearance:  Older than stated years  Head/Neck:  Normal, without cervical or supraclavicular adenopathy. Thyroid:  Symmetrical, normal in size, without palpable masses or nodularity. Respiratory  Effort:  Normal  Auscultation:  Clear without wheezing or rhonchi Cardiovascular  Auscultation:  Regular rate, without rubs, murmurs or gallops  Edema/varicosities:  Not grossly evident Abdominal  Soft,nontender, without masses, guarding or rebound.  Liver/spleen:  No organomegaly noted  Hernia:  None appreciated  Skin  Inspection:  Grossly normal  Palpation:  Grossly normal Neurologic/psychiatric  Orientation:  Normal with appropriate conversation.  Mood/affect:  Normal  Genitourinary    Breasts: Examined lying and sitting.     Right: Without masses, retractions, discharge or axillary adenopathy.     Left: Without masses, retractions, discharge or axillary adenopathy.   Inguinal/mons:  Normal without inguinal adenopathy  External genitalia:  Normal  BUS/Urethra/Skene's glands:  Normal  Bladder:  Normal  Vagina:  Atrophic, wet prep positive for clues, TNTC bacteria.     Cervix:  Normal  Uterus:   normal in size, shape and contour.  Midline and mobile  Adnexa/parametria:     Rt: Without masses or tenderness.   Lt: Without masses or tenderness.  Anus and perineum: Normal  Digital rectal exam: Normal sphincter tone without palpated masses or tenderness  Assessment/Plan:  51 y.o.  MWF G1 P1 for annual exam.     Postmenopausal Vaginal atrophy  BV Smoker  Plan: MetroGel vaginal cream 1 applicator at bedtime x5, encouraged vaginal lubricants with intercourse. DEXA, will schedule here. Reviewed importance of screening colonoscopy, will schedule. SBE's, continue annual mammogram, continue active lifestyle, vitamin D 2000 daily encouraged. Reviewed importance of decreasing/quitting smoking, declines medication or patches to help. Pap, UA, new Pap screening guidelines reviewed.   Huel Cote [facility], 9:49 AM 02/03/2012

## 2012-02-03 NOTE — Patient Instructions (Addendum)
Vit D 20000 daily Stop smoking!!!! Schedule colonoscopy   Health Recommendations for Postmenopausal Women Based on the Results of the [facility]) and Other Studies The [facility] is a major 15-year research program to address the most common causes of death, disability and poor quality of life in postmenopausal women. Some of these causes are heart disease, cancer, bone loss (osteoporosis) and others. Taking into account all of the findings from [facility] and other studies, here are bottom-line health recommendations for women: CARDIOVASCULAR DISEASE Heart Disease: A heart attack is a medical emergency. Know the signs and symptoms of a heart attack. Hormone therapy should not be used to prevent heart disease. In women with heart disease, hormone therapy should not be used to prevent further disease. Hormone therapy increases the risk of blood clots. Below are things women can do to reduce their risk for heart disease.   Do not smoke. If you smoke, quit. Women who smoke are 2 to 6 times more likely to suffer a heart attack than non-smoking women.  Aim for a healthy weight. Being overweight causes many preventable deaths. Eat a healthy and balanced diet and drink an adequate amount of liquids.  Get moving. Make a commitment to be more physically active. Aim for 30 minutes of activity on most, if not all days of the week.  Eat for heart health. Choose a diet that is low in saturated fat, trans fat, and cholesterol. Include whole grains, vegetables, and fruits. Read the labels on the food container before buying it.  Know your numbers. Ask your caregiver to check your blood pressure, cholesterol (total, HDL, LDL, triglycerides) and blood glucose. Work with your caregiver to improve any numbers that are not normal.  High blood pressure. Limit or stop your table salt intake (try salt substitute and food seasonings), avoid salty foods and drinks. Read the labels on the food container before  buying it. Avoid becoming overweight by eating well and exercising. STROKE  Stroke is a medical emergency. Stroke can be the result of a blood clot in the blood vessel in the brain or by a brain hemorrhage (bleeding). Know the signs and symptoms of a stroke. To lower the risk of developing a stroke:  Avoid fatty foods.  Quit smoking.  Control your diabetes, blood pressure, and irregular heart rate. THROMBOPHLIBITIS (BLOOD CLOT) OF THE LEG  Hormone treatment is a big cause of developing blood clots in the leg. Becoming overweight and leading a stationary lifestyle also may contribute to developing blood clots. Controlling your diet and exercising will help lower the risk of developing blood clots. CANCER SCREENING  Breast Cancer: Women should take steps to reduce their risk of breast cancer. This includes having regular mammograms, monthly self breast exams and regular breast exams by your caregiver. Have a mammogram every one to two years if you are 58 to 50 years old. Have a mammogram annually if you are 50 years old or older depending on your risk factors. Women who are high risk for breast cancer may need more frequent mammograms. There are tests available (testing the genes in your body) if you have family history of breast cancer called BRCA 1 and 2. These tests can help determine the risks of developing breast cancer.  Intestinal or Stomach Cancer: Women should talk to their caregiver about when to start screening, what tests and how often they should be done, and the benefits and risks of doing these tests. Tests to consider are a rectal exam, fecal  occult blood, sigmoidoscopy, colononoscoby, barium enema and upper GI series of the stomach. Depending on the age, you may want to get a medical and family history of colon cancer. Women who are high risk may need to be screened at an earlier age and more often.  Cervical Cancer: A Pap test of the cervix should be done every year and every 3 years  when there has been three straight years of a normal Pap test. Women with an abnormal Pap test should be screened more often or have a cervical biopsy depending on your caregiver's recommendation.  Uterine Cancer: If you have vaginal bleeding after you are in the menopause, it should be evaluated by your caregiver.  Ovarian cancer: There are no reliable tests available to screen for ovarian cancer at this time except for yearly pelvic exams.  Lung Cancer: Yearly chest X-rays can detect lung cancer and should be done on high risk women, such as cigarette smokers and women with chronic lung disease (emphysemia).  Skin Cancer: A complete body skin exam should be done at your yearly examination. Avoid overexposure to the sun and ultraviolet light lamps. Use a strong sun block cream when in the sun. All of these things are important in lowering the risk of skin cancer. MENOPAUSE Menopause Symptoms: Hormone therapy products are effective for treating symptoms associated with menopause:  Moderate to severe hot flashes.  Night sweats.  Mood swings.  Headaches.  Tiredness.  Loss of sex drive.  Insomnia.  Other symptoms. However, hormone therapy products carry serious risks, especially in older women. Women who use or are thinking about using estrogen or estrogen with progestin treatments should discuss that with their caregiver. Your caregiver will know if the benefits outweigh the risks. The Food and Drug Administration (FDA) has concluded that hormone therapy should be used only at the lowest doses and for the shortest amount of time to reach treatment goals. It is not known at what doses there may be less risk of serious side effects. There are other treatments such as herbal medication (not controlled or regulated by the FDA), group therapy, counseling and acupuncture that may be helpful. OSTEOPOROSIS Protecting Against Bone Loss and Preventing Fracture: If hormone therapy is used for  prevention of bone loss (osteoporosis), the risks for bone loss must outweigh the risk of the therapy. Women considering taking hormone therapy for bone loss should ask their health care providers about other medications (fosamax and boniva) that are considered safe and effective for preventing bone loss and bone fractures. To guard against bone loss or fractures, it is recommended that women should take at least 1000-1500 mg of calcium and 400-800 IU of vitamin D daily in divided doses. Smoking and excessive alcohol intake increases the risk of osteoporosis. Eat foods rich in calcium and vitamin D and do weight bearing exercises several times a week as your caregiver suggests. DIABETES Diabetes Melitus: Women with Type I or Type 2 diabetes should keep their diabetes in control with diet, exercise and medication. Avoid too many sweets, starchy and fatty foods. Being overweight can affect your diabetes. COGNITION AND MEMORY Cognition and Memory: Menopausal hormone therapy is not recommended for the prevention of cognitive disorders such as Alzheimer's disease or memory loss. [facility] found that women treated with hormone therapy have a greater risk of developing dementia.  DEPRESSION  Depression may occur at any age, but is common in elderly women. The reasons may be because of physical, medical, social (loneliness), financial and/or economic problems  and needs. Becoming involved with church, volunteer or social groups, seeking treatment for any physical or medical problems is recommended. Also, look into getting professional advice for any economic or financial problems. ACCIDENTS  Accidents are common and can be serious in the elderly woman. Prepare your house to prevent accidents. Eliminate throw rugs, use hip protectors, place hand bars in the bath, shower and toilet areas. Avoid wearing high heel shoes and walking on wet, snowy and icy areas. Stop driving if you have vision, hearing problems or are unsteady  with you movements and reflexes. RHEUMATOID ARTHRITIS Rheumatoid arthritis causes pain, swelling and stiffness of your bone joints. It can limit many of your activities. Over-the-counter medications may help, but prescription medications may be necessary. Talk with your caregiver about this. Exercise (walking, water aerobics), good posture, using splints on painful joints, warm baths or applying warm compresses to stiff joints and cold compresses to painful joints may be helpful. Smoking and excessive drinking may worsen the symptoms of arthritis. Seek help from a physical therapist if the arthritis is becoming a problem with your daily activities. IMMUNIZATIONS  Several immunizations are important to have during your senior years, including:   Tetanus and a diptheria shot booster every 10 years.  Influenza every year before the flu season begins.  Pneumonia vaccine.  Shingles vaccine.  Others as indicated (example: H1N1 vaccine). Document Released: 05/14/2005 Document Revised: 06/14/2011 Document Reviewed: 01/08/2008 [facility] Patient Information 2013 [place].

## 2012-02-04 LAB — URINALYSIS W MICROSCOPIC + REFLEX CULTURE
Hgb urine dipstick: NEGATIVE
Nitrite: NEGATIVE
Protein, ur: NEGATIVE mg/dL
pH: 5.5 (ref 5.0–8.0)

## 2012-02-05 LAB — URINE CULTURE
Colony Count: NO GROWTH
Organism ID, Bacteria: NO GROWTH

## 2012-02-08 ENCOUNTER — Institutional Professional Consult (permissible substitution): Payer: Self-pay | Admitting: Internal Medicine

## 2012-02-29 ENCOUNTER — Other Ambulatory Visit: Payer: Self-pay | Admitting: Occupational Medicine

## 2012-02-29 ENCOUNTER — Ambulatory Visit: Payer: Self-pay

## 2012-02-29 DIAGNOSIS — R52 Pain, unspecified: Secondary | ICD-10-CM

## 2012-03-15 ENCOUNTER — Emergency Department (HOSPITAL_COMMUNITY)
Admission: EM | Admit: 2012-03-15 | Discharge: 2012-03-15 | Disposition: A | Discharge status: Home / Self Care | Payer: 59 | Attending: Emergency Medicine | Admitting: Emergency Medicine

## 2012-03-15 ENCOUNTER — Emergency Department (HOSPITAL_COMMUNITY): Payer: 59

## 2012-03-15 ENCOUNTER — Encounter (HOSPITAL_COMMUNITY): Payer: Self-pay | Admitting: *Deleted

## 2012-03-15 DIAGNOSIS — H40009 Preglaucoma, unspecified, unspecified eye: Secondary | ICD-10-CM | POA: Insufficient documentation

## 2012-03-15 DIAGNOSIS — K700 Alcoholic fatty liver: Secondary | ICD-10-CM | POA: Insufficient documentation

## 2012-03-15 DIAGNOSIS — Z8679 Personal history of other diseases of the circulatory system: Secondary | ICD-10-CM | POA: Insufficient documentation

## 2012-03-15 DIAGNOSIS — Y929 Unspecified place or not applicable: Secondary | ICD-10-CM | POA: Insufficient documentation

## 2012-03-15 DIAGNOSIS — Y9301 Activity, walking, marching and hiking: Secondary | ICD-10-CM | POA: Insufficient documentation

## 2012-03-15 DIAGNOSIS — S79929A Unspecified injury of unspecified thigh, initial encounter: Secondary | ICD-10-CM | POA: Insufficient documentation

## 2012-03-15 DIAGNOSIS — S79919A Unspecified injury of unspecified hip, initial encounter: Secondary | ICD-10-CM | POA: Insufficient documentation

## 2012-03-15 DIAGNOSIS — M25559 Pain in unspecified hip: Secondary | ICD-10-CM

## 2012-03-15 DIAGNOSIS — W010XXA Fall on same level from slipping, tripping and stumbling without subsequent striking against object, initial encounter: Secondary | ICD-10-CM | POA: Insufficient documentation

## 2012-03-15 DIAGNOSIS — E785 Hyperlipidemia, unspecified: Secondary | ICD-10-CM | POA: Insufficient documentation

## 2012-03-15 DIAGNOSIS — IMO0002 Reserved for concepts with insufficient information to code with codable children: Secondary | ICD-10-CM | POA: Insufficient documentation

## 2012-03-15 DIAGNOSIS — W19XXXA Unspecified fall, initial encounter: Secondary | ICD-10-CM

## 2012-03-15 DIAGNOSIS — F17200 Nicotine dependence, unspecified, uncomplicated: Secondary | ICD-10-CM | POA: Insufficient documentation

## 2012-03-15 DIAGNOSIS — M549 Dorsalgia, unspecified: Secondary | ICD-10-CM

## 2012-03-15 DIAGNOSIS — I10 Essential (primary) hypertension: Secondary | ICD-10-CM | POA: Insufficient documentation

## 2012-03-15 MED ORDER — ACETAMINOPHEN 325 MG PO TABS
650.0000 mg | ORAL_TABLET | Freq: Once | ORAL | Status: AC
  Administered 2012-03-15: 650 mg via ORAL
  Filled 2012-03-15: qty 2

## 2012-03-15 NOTE — ED Notes (Signed)
Pt sts slipped on ice this am and fell on her butt bone and now has pain with walking and increases if she bends over.  No LOC and no syncope

## 2012-03-15 NOTE — ED Provider Notes (Signed)
History     CSN: [id number]  Arrival date & time 03/15/12  1961   First MD Initiated Contact with Patient 03/15/12 [id number]      Chief Complaint  Patient presents with  . Fall    (Consider location/radiation/quality/duration/timing/severity/associated sxs/prior treatment) HPI Comments: This is a 51 year old female, who presents emergency department with chief complaint of fall. Patient states that she slipped while walking on ice this morning, and felt to the ground. She reports striking her tailbone on the ground. She reports increased pain with walking and bending. She denies loss of consciousness or syncope. Also denies headache, chest pain, shortness of breath, nausea, vomiting, diarrhea, constipation, numbness or tingling of the extremities. She states that her pain is 3/10. She has not tried anything to alleviate her symptoms.  She did not hit her head.  The history is provided by the patient. No language interpreter was used.    Past Medical History  Diagnosis Date  . Alcoholic fatty liver   . GLAUCOMA, BORDERLINE   . HYPERLIPIDEMIA   . HYPERTENSION   . PALPITATIONS, OCCASIONAL   . SMOKER     Past Surgical History  Procedure Date  . Cervix lesion destruction 1980'S    Family History  Problem Relation Age of Onset  . Coronary artery disease Other     female first degree <50  . Cancer Sister     OVARIAN @ 58  . Diabetes Maternal Grandfather     History  Substance Use Topics  . Smoking status: Current Every Day Smoker    Types: Cigarettes  . Smokeless tobacco: Never Used  . Alcohol Use: Yes     Comment: OCC    OB History    Grav Para Term Preterm Abortions TAB SAB Ect Mult Living   3 1 1  2  2   1       Review of Systems  All other systems reviewed and are negative.    Allergies  Doxycycline; Benzonatate; Sulfonamide derivatives; and Penicillins  Home Medications  No current outpatient prescriptions on file.  BP 144/83  Pulse 93  Temp 97.8 F  (36.6 C) (Oral)  Resp 18  SpO2 98%  Physical Exam  Nursing note and vitals reviewed. Constitutional: She is oriented to person, place, and time. She appears well-developed and well-nourished.  HENT:  Head: Normocephalic and atraumatic.  Eyes: Conjunctivae normal and EOM are normal. Pupils are equal, round, and reactive to light.  Neck: Normal range of motion. Neck supple.  Cardiovascular: Normal rate and regular rhythm.  Exam reveals no gallop and no friction rub.   No murmur heard. Pulmonary/Chest: Effort normal and breath sounds normal. No respiratory distress. She has no wheezes. She has no rales. She exhibits no tenderness.  Abdominal: Soft. Bowel sounds are normal. She exhibits no distension and no mass. There is no tenderness. There is no rebound and no guarding.  Musculoskeletal: Normal range of motion. She exhibits no edema and no tenderness.       Tenderness to palpation over lumbar spine and sacral spine, mild tenderness to palpation over left hip, range of motion limited secondary to pain.  Neurological: She is alert and oriented to person, place, and time.  Skin: Skin is warm and dry.  Psychiatric: She has a normal mood and affect. Her behavior is normal. Judgment and thought content normal.    ED Course  Procedures (including critical care time)  Results for orders placed in visit on 02/03/12  [place],  YEAST, CLUE      Component Value Range   Yeast Wet Prep HPF POC NONE SEEN  NONE SEEN   Trich, Wet Prep NONE SEEN  NONE SEEN   Clue Cells Wet Prep HPF POC FEW (*) NONE SEEN   WBC, Wet Prep HPF POC MANY (*) NONE SEEN  URINALYSIS WITH CULTURE REFLEX      Component Value Range   Color, Urine YELLOW  YELLOW   APPearance CLEAR  CLEAR   Specific Gravity, Urine 1.024  1.005 - 1.030   pH 5.5  5.0 - 8.0   Glucose, UA NEG  NEG mg/dL   Bilirubin Urine NEG  NEG   Ketones, ur NEG  NEG mg/dL   Hgb urine dipstick NEG  NEG   Protein, ur NEG  NEG mg/dL   Urobilinogen,  UA 0.2  0.0 - 1.0 mg/dL   Nitrite NEG  NEG   Leukocytes, UA LARGE (*) NEG   Squamous Epithelial / LPF FEW  RARE   Crystals Calcium Oxalate crystals noted  NONE SEEN   Casts NONE SEEN  NONE SEEN   WBC, UA 11-20 (*) <3 WBC/hpf   RBC / HPF 0-2  <3 RBC/hpf   Bacteria, UA NONE SEEN  RARE  URINE CULTURE      Component Value Range   Colony Count NO GROWTH     Organism ID, Bacteria NO GROWTH     Dg Lumbar Spine Complete  03/15/2012  *RADIOLOGY REPORT*  Clinical Data: Fall.  Low back pain.  LUMBAR SPINE - COMPLETE 4+ VIEW  Comparison: Lumbar spine x-rays 02/01/2007.  Findings: Five non-rib bearing lumbar vertebrae with anatomic alignment.  No fractures.  Mild disc space narrowing and endplate hypertrophic changes at L2-3, slightly progressive since the prior examination.  Stable mild disc space narrowing at L4-5.  Remaining disc spaces well preserved.  No pars defects.  No significant facet arthropathy.  No significant interval change.  IMPRESSION: No acute osseous abnormality.  Mild degenerative disc disease and spondylosis at L2-3.  Mild degenerative disc disease at L4-5.  No significant change since 2008.   Original Report Authenticated By: Evangeline Dakin, M.D.    Dg Sacrum/coccyx  03/15/2012  *RADIOLOGY REPORT*  Clinical Data: Fall.  Sacrococcygeal injury.  SACRUM AND COCCYX - 2+ VIEW  Comparison: None.  Findings: No evidence of acute fracture involving the sacrum or coccyx.  Coccygeal segments anatomically aligned without acute angulation.  Sacroiliac joints and symphysis pubis intact.  IMPRESSION: Normal examination.   Original Report Authenticated By: Evangeline Dakin, M.D.    Dg Hip Complete Left  03/15/2012  *RADIOLOGY REPORT*  Clinical Data: [place] and injured left hip.  LEFT HIP - COMPLETE 2+ VIEW  Comparison: None.  Findings: No evidence of acute or subacute fracture or dislocation. Joint space well-preserved.  No intrinsic osseous abnormalities. No visible joint effusion.  Included AP  pelvis demonstrates a normal appearing contralateral right hip.  Sacroiliac joints and symphysis pubis intact.  No fractures elsewhere involving the bony pelvis.  Visualized lower lumbar spine unremarkable.  IMPRESSION: Normal examination.   Original Report Authenticated By: Evangeline Dakin, M.D.        No diagnosis found.    MDM  51 year old female with fall. I will obtain x-rays, and reevaluate.   12:00 PM Plain films show no acute process. I'm going to discharge the patient to home with ibuprofen and ice, and instructions to rest. Patient understands and agrees with the plan. She is to followup with her primary  care provider if the symptoms worsen in the next few days. Patient is stable and ready for discharge.        Montine Circle, PA-C 03/15/12 1202

## 2012-03-15 NOTE — ED Provider Notes (Signed)
Medical screening examination/treatment/procedure(s) were performed by non-physician practitioner and as supervising physician I was immediately available for consultation/collaboration.   Mylinda Latina III, MD 03/15/12 2105

## 2012-06-18 ENCOUNTER — Emergency Department (HOSPITAL_COMMUNITY): Payer: 59

## 2012-06-18 ENCOUNTER — Emergency Department (HOSPITAL_COMMUNITY)
Admission: EM | Admit: 2012-06-18 | Discharge: 2012-06-18 | Disposition: A | Discharge status: Home / Self Care | Payer: 59 | Attending: Emergency Medicine | Admitting: Emergency Medicine

## 2012-06-18 ENCOUNTER — Encounter (HOSPITAL_COMMUNITY): Payer: Self-pay | Admitting: *Deleted

## 2012-06-18 DIAGNOSIS — Y929 Unspecified place or not applicable: Secondary | ICD-10-CM | POA: Insufficient documentation

## 2012-06-18 DIAGNOSIS — R079 Chest pain, unspecified: Secondary | ICD-10-CM | POA: Insufficient documentation

## 2012-06-18 DIAGNOSIS — I10 Essential (primary) hypertension: Secondary | ICD-10-CM | POA: Insufficient documentation

## 2012-06-18 DIAGNOSIS — S79919A Unspecified injury of unspecified hip, initial encounter: Secondary | ICD-10-CM | POA: Insufficient documentation

## 2012-06-18 DIAGNOSIS — IMO0002 Reserved for concepts with insufficient information to code with codable children: Secondary | ICD-10-CM | POA: Insufficient documentation

## 2012-06-18 DIAGNOSIS — Z862 Personal history of diseases of the blood and blood-forming organs and certain disorders involving the immune mechanism: Secondary | ICD-10-CM | POA: Insufficient documentation

## 2012-06-18 DIAGNOSIS — W108XXA Fall (on) (from) other stairs and steps, initial encounter: Secondary | ICD-10-CM | POA: Insufficient documentation

## 2012-06-18 DIAGNOSIS — Z8639 Personal history of other endocrine, nutritional and metabolic disease: Secondary | ICD-10-CM | POA: Insufficient documentation

## 2012-06-18 DIAGNOSIS — S46912A Strain of unspecified muscle, fascia and tendon at shoulder and upper arm level, left arm, initial encounter: Secondary | ICD-10-CM

## 2012-06-18 DIAGNOSIS — S4980XA Other specified injuries of shoulder and upper arm, unspecified arm, initial encounter: Secondary | ICD-10-CM | POA: Insufficient documentation

## 2012-06-18 DIAGNOSIS — T148XXA Other injury of unspecified body region, initial encounter: Secondary | ICD-10-CM | POA: Insufficient documentation

## 2012-06-18 DIAGNOSIS — Z8719 Personal history of other diseases of the digestive system: Secondary | ICD-10-CM | POA: Insufficient documentation

## 2012-06-18 DIAGNOSIS — S0990XA Unspecified injury of head, initial encounter: Secondary | ICD-10-CM | POA: Insufficient documentation

## 2012-06-18 DIAGNOSIS — Z780 Asymptomatic menopausal state: Secondary | ICD-10-CM

## 2012-06-18 DIAGNOSIS — Z8669 Personal history of other diseases of the nervous system and sense organs: Secondary | ICD-10-CM | POA: Insufficient documentation

## 2012-06-18 DIAGNOSIS — Y939 Activity, unspecified: Secondary | ICD-10-CM | POA: Insufficient documentation

## 2012-06-18 DIAGNOSIS — S46909A Unspecified injury of unspecified muscle, fascia and tendon at shoulder and upper arm level, unspecified arm, initial encounter: Secondary | ICD-10-CM | POA: Insufficient documentation

## 2012-06-18 DIAGNOSIS — F17200 Nicotine dependence, unspecified, uncomplicated: Secondary | ICD-10-CM | POA: Insufficient documentation

## 2012-06-18 DIAGNOSIS — W19XXXA Unspecified fall, initial encounter: Secondary | ICD-10-CM

## 2012-06-18 DIAGNOSIS — W1809XA Striking against other object with subsequent fall, initial encounter: Secondary | ICD-10-CM | POA: Insufficient documentation

## 2012-06-18 MED ORDER — HYDROCODONE-ACETAMINOPHEN 5-325 MG PO TABS: 1.0000 | ORAL_TABLET | ORAL | Status: DC | PRN

## 2012-06-18 NOTE — ED Provider Notes (Signed)
Medical screening examination/treatment/procedure(s) were performed by non-physician practitioner and as supervising physician I was immediately available for consultation/collaboration.   Delora Fuel, MD 73/08/56 [id number]

## 2012-06-18 NOTE — Progress Notes (Deleted)
Orthopedic Tech Progress Note Patient Details:  Paula Parker [birth year] [id number]  Ortho Devices Type of Ortho Device: Arm sling   Katheren Shams 06/18/2012, 6:05 AM

## 2012-06-18 NOTE — ED Notes (Signed)
Pt states she fell down 5 steps earlier tonight, 30 min PTA.  States she did not lose consciousness, hit back of head.  Also c/o left shoulder, left hand, and left buttocks pain.  Pain 5/10

## 2012-06-18 NOTE — ED Provider Notes (Signed)
History     CSN: [id number]  Arrival date & time 06/18/12  0045   First MD Initiated Contact with Patient 06/18/12 0122      Chief Complaint  Patient presents with  . Fall   HPI  History provided by the patient. Patient is a 52 year old female with history of hypertension hyperlipidemia who presents with injuries after a fall. Patient was at home on the porch and reports losing her balance as she walked up the last causing her to stumble and fall backwards she reports falling down approximately 4 steps and hitting the left side of her head, shoulder and hip. Patient denies LOC. Patient complains of some soreness to the areas where she landed. She has not used any treatments for her pain. Pain is worse with palpation and certain movements. There is no associated confusion, dizziness, nausea or vomiting. Patient denies any back or neck pains.    Past Medical History  Diagnosis Date  . Alcoholic fatty liver   . GLAUCOMA, BORDERLINE   . HYPERLIPIDEMIA   . HYPERTENSION   . PALPITATIONS, OCCASIONAL   . SMOKER     Past Surgical History  Procedure Laterality Date  . Cervix lesion destruction  1980'S    Family History  Problem Relation Age of Onset  . Coronary artery disease Other     female first degree <50  . Cancer Sister     OVARIAN @ 38  . Diabetes Maternal Grandfather     History  Substance Use Topics  . Smoking status: Current Every Day Smoker    Types: Cigarettes  . Smokeless tobacco: Never Used  . Alcohol Use: Yes     Comment: OCC    OB History   Grav Para Term Preterm Abortions TAB SAB Ect Mult Living   3 1 1  2  2   1       Review of Systems  HENT: Negative for neck pain.   Respiratory: Negative for shortness of breath.   Cardiovascular: Positive for chest pain.  Gastrointestinal: Negative for nausea, vomiting and abdominal pain.  Musculoskeletal: Negative for back pain.  Neurological: Positive for headaches. Negative for dizziness, speech difficulty and  light-headedness.  Psychiatric/Behavioral: Negative for confusion.  All other systems reviewed and are negative.    Allergies  Doxycycline; Benzonatate; Sulfonamide derivatives; and Penicillins  Home Medications   Current Outpatient Rx  Name  Route  Sig  Dispense  Refill  . ibuprofen (ADVIL,MOTRIN) 200 MG tablet   Oral   Take 200 mg by mouth every 6 (six) hours as needed for pain.           BP 131/75  Temp(Src) 96.9 F (36.1 C) (Oral)  Resp 18  SpO2 95%  Physical Exam  Nursing note and vitals reviewed. Constitutional: She is oriented to person, place, and time. She appears well-developed and well-nourished. No distress.  HENT:  Head: Normocephalic and atraumatic.  No hemotympanum bilaterally  Neck: Normal range of motion.  No cervical midline tenderness  Cardiovascular: Normal rate and regular rhythm.   Pulmonary/Chest: Effort normal and breath sounds normal. She exhibits tenderness.  Left lateral chest wall tenderness. No crepitus or deformities.  Abdominal: Soft.  Musculoskeletal:  Abrasions over left shoulder with some mild swelling and tenderness to palpation. No gross deformities. Very slight reduction in range of motion secondary to pain. No deformities along clavicle.  There is swelling and slight bruising to the left index finger especially over the PIP joint. Normal distal sensations and cap  refill.  Tenderness over the left iliac crests and pelvis area. Pelvis is stable. All distal sensations, strength and pulses in feet  Neurological: She is alert and oriented to person, place, and time.  Skin: Skin is warm and dry. No rash noted.  Psychiatric: She has a normal mood and affect. Her behavior is normal.    ED Course  Procedures       Dg Ribs Unilateral W/chest Left  06/18/2012  *RADIOLOGY REPORT*  Clinical Data: Left anterior superior rib pain after fall.  LEFT RIBS AND CHEST - 3+ VIEW  Comparison: Chest 01/24/2012  Findings: The heart size and  pulmonary vascularity are normal. The lungs appear clear and expanded without focal air space disease or consolidation. No blunting of the costophrenic angles.  No pneumothorax.  Mediastinal contours appear intact.  The left ribs appear intact.  No displaced fractures or focal bone lesions appreciated.  IMPRESSION: No evidence of active pulmonary disease.  No displaced left rib fractures.   Original Report Authenticated By: Lucienne Capers, M.D.    Dg Hip Complete Left  06/18/2012  *RADIOLOGY REPORT*  Clinical Data: Left hip pain after fall.  LEFT HIP - COMPLETE 2+ VIEW  Comparison: 03/15/2012  Findings: The pelvis, right hip, SI joints, symphysis pubis, and pelvic rim appear intact.  No displaced fractures are identified.  Left hip appears intact.  No evidence of acute fracture or subluxation.  No focal bone lesion or bone destruction.  Bone cortex and trabecular architecture appear intact.  IMPRESSION: No displaced fractures identified in the pelvis or left hip.   Original Report Authenticated By: Lucienne Capers, M.D.    Ct Head Wo Contrast  06/18/2012  *RADIOLOGY REPORT*  Clinical Data:  The patient fell down steps.  Struck head on the left.  Left sided headache.  Nausea.  CT HEAD WITHOUT CONTRAST CT CERVICAL SPINE WITHOUT CONTRAST  Technique:  Multidetector CT imaging of the head and cervical spine was performed following the standard protocol without intravenous contrast.  Multiplanar CT image reconstructions of the cervical spine were also generated.  Comparison:  Cervical spine radiographs 09/27/2005  CT HEAD  Findings: The ventricles and sulci are symmetrical without significant effacement, displacement, or dilatation. No mass effect or midline shift. No abnormal extra-axial fluid collections. The grey-white matter junction is distinct. Basal cisterns are not effaced. No acute intracranial hemorrhage. No depressed skull fractures.  Visualized paranasal sinuses and mastoid air cells are not opacified.   IMPRESSION: No acute intracranial abnormalities.  CT CERVICAL SPINE  Findings: Normal alignment of the cervical vertebrae and facet joints.  Lateral masses of C1 appear symmetrical.  The odontoid process is intact.  No vertebral compression deformities. Intervertebral disc space heights are preserved.  No prevertebral soft tissue swelling.  No focal bone lesion or bone destruction. Bone cortex and trabecular architecture appear intact.  No abnormal paraspinal soft tissue infiltration.  Cervical lymph nodes are not pathologically enlarged.  IMPRESSION: No displaced cervical spine fractures are identified.   Original Report Authenticated By: Lucienne Capers, M.D.    Ct Cervical Spine Wo Contrast  06/18/2012  *RADIOLOGY REPORT*  Clinical Data:  The patient fell down steps.  Struck head on the left.  Left sided headache.  Nausea.  CT HEAD WITHOUT CONTRAST CT CERVICAL SPINE WITHOUT CONTRAST  Technique:  Multidetector CT imaging of the head and cervical spine was performed following the standard protocol without intravenous contrast.  Multiplanar CT image reconstructions of the cervical spine were also generated.  Comparison:  Cervical spine radiographs 09/27/2005  CT HEAD  Findings: The ventricles and sulci are symmetrical without significant effacement, displacement, or dilatation. No mass effect or midline shift. No abnormal extra-axial fluid collections. The grey-white matter junction is distinct. Basal cisterns are not effaced. No acute intracranial hemorrhage. No depressed skull fractures.  Visualized paranasal sinuses and mastoid air cells are not opacified.  IMPRESSION: No acute intracranial abnormalities.  CT CERVICAL SPINE  Findings: Normal alignment of the cervical vertebrae and facet joints.  Lateral masses of C1 appear symmetrical.  The odontoid process is intact.  No vertebral compression deformities. Intervertebral disc space heights are preserved.  No prevertebral soft tissue swelling.  No focal bone  lesion or bone destruction. Bone cortex and trabecular architecture appear intact.  No abnormal paraspinal soft tissue infiltration.  Cervical lymph nodes are not pathologically enlarged.  IMPRESSION: No displaced cervical spine fractures are identified.   Original Report Authenticated By: Lucienne Capers, M.D.    Dg Shoulder Left  06/18/2012  *RADIOLOGY REPORT*  Clinical Data: Left shoulder pain after fall.  LEFT SHOULDER - 2+ VIEW  Comparison: None.  Findings: The left shoulder appears intact. No evidence of acute fracture or subluxation.  No focal bone lesions.  Bone matrix and cortex appear intact.  No abnormal radiopaque densities in the soft tissues.  Coracoclavicular and acromioclavicular spaces are preserved.  IMPRESSION: No acute bony abnormalities demonstrated in the left shoulder.   Original Report Authenticated By: Lucienne Capers, M.D.    Dg Finger Index Left  06/18/2012  *RADIOLOGY REPORT*  Clinical Data: Pain in the left index finger after fall.  LEFT INDEX FINGER 2+V  Comparison: None.  Findings: Soft tissue swelling over the proximal aspect of the left second finger.  Mild degenerative changes in the interphalangeal joints.  No evidence of acute fracture or subluxation.  No focal bone lesion or bone destruction.  Bone cortex and trabecular architecture appear intact.  No radiopaque soft tissue foreign bodies.  IMPRESSION: Soft tissue swelling.  No acute fractures demonstrated.   Original Report Authenticated By: Lucienne Capers, M.D.      1. Postmenopause   2. Fall, initial encounter   3. Contusion   4. Shoulder strain, left, initial encounter       MDM  Patient seen and evaluated. Patient appears well at this time. Patient prepped the smell of alcohol. X-rays and CT scans ordered.        Martie Lee, PA-C 06/18/12 2136

## 2012-06-21 ENCOUNTER — Ambulatory Visit (INDEPENDENT_AMBULATORY_CARE_PROVIDER_SITE_OTHER)
Admission: RE | Admit: 2012-06-21 | Discharge: 2012-06-21 | Disposition: A | Discharge status: Home / Self Care | Payer: 59 | Source: Ambulatory Visit | Attending: Internal Medicine | Admitting: Internal Medicine

## 2012-06-21 ENCOUNTER — Encounter: Payer: Self-pay | Admitting: Internal Medicine

## 2012-06-21 ENCOUNTER — Ambulatory Visit (INDEPENDENT_AMBULATORY_CARE_PROVIDER_SITE_OTHER): Payer: 59 | Admitting: Internal Medicine

## 2012-06-21 VITALS — BP 120/86 | HR 85 | Temp 98.3°F | Resp 16

## 2012-06-21 DIAGNOSIS — S20219S Contusion of unspecified front wall of thorax, sequela: Secondary | ICD-10-CM

## 2012-06-21 DIAGNOSIS — IMO0002 Reserved for concepts with insufficient information to code with codable children: Secondary | ICD-10-CM

## 2012-06-21 DIAGNOSIS — S20219A Contusion of unspecified front wall of thorax, initial encounter: Secondary | ICD-10-CM | POA: Insufficient documentation

## 2012-06-21 NOTE — Patient Instructions (Signed)
Chest Wall Pain Chest wall pain is pain in or around the bones and muscles of your chest. It may take up to 6 weeks to get better. It may take longer if you must stay physically active in your work and activities.  CAUSES  Chest wall pain may happen on its own. However, it may be caused by:  A viral illness like the flu.  Injury.  Coughing.  Exercise.  Arthritis.  Fibromyalgia.  Shingles. HOME CARE INSTRUCTIONS   Avoid overtiring physical activity. Try not to strain or perform activities that cause pain. This includes any activities using your chest or your abdominal and side muscles, especially if heavy weights are used.  Put ice on the sore area.  Put ice in a plastic bag.  Place a towel between your skin and the bag.  Leave the ice on for 15 to 20 minutes per hour while awake for the first 2 days.  Only take over-the-counter or prescription medicines for pain, discomfort, or fever as directed by your caregiver. SEEK IMMEDIATE MEDICAL CARE IF:   Your pain increases, or you are very uncomfortable.  You have a fever.  Your chest pain becomes worse.  You have new, unexplained symptoms.  You have nausea or vomiting.  You feel sweaty or lightheaded.  You have a cough with phlegm (sputum), or you cough up blood. MAKE SURE YOU:   Understand these instructions.  Will watch your condition.  Will get help right away if you are not doing well or get worse. Document Released: 03/22/2005 Document Revised: 06/14/2011 Document Reviewed: 11/16/2010 [facility] Patient Information 2013 [place].

## 2012-06-22 ENCOUNTER — Encounter: Payer: Self-pay | Admitting: Internal Medicine

## 2012-06-22 ENCOUNTER — Telehealth: Payer: Self-pay

## 2012-06-22 NOTE — Progress Notes (Signed)
Subjective:    Patient ID: Paula Parker, female    DOB: March 23, 1961, 52 y.o.   MRN: [id number]  Injury The incident occurred 3 to 5 days ago. The incident occurred at home. The injury mechanism was a fall. The injury occurred in the context of other. No protective equipment was used. Torso injury location: left upper chest. The pain is mild. It is unlikely that a foreign body is present. Associated symptoms include chest pain. Pertinent negatives include no abdominal pain, abnormal behavior, coughing, difficulty breathing, headaches, hearing loss, inability to bear weight, light-headedness, loss of consciousness, memory loss, nausea, neck pain, numbness, tingling, visual disturbance, vomiting or weakness. Her tetanus status is UTD.      Review of Systems  Constitutional: Negative.   HENT: Negative.  Negative for hearing loss and neck pain.   Eyes: Negative.  Negative for visual disturbance.  Respiratory: Negative for apnea, cough, choking, chest tightness, shortness of breath and wheezing.   Cardiovascular: Positive for chest pain. Negative for palpitations and leg swelling.  Gastrointestinal: Negative.  Negative for nausea, vomiting, abdominal pain, diarrhea and constipation.  Endocrine: Negative.   Genitourinary: Negative.  Negative for dysuria and hematuria.  Musculoskeletal: Negative.  Negative for myalgias, back pain, joint swelling and gait problem.  Skin: Negative.  Negative for color change, pallor, rash and wound.  Allergic/Immunologic: Negative.   Neurological: Negative.  Negative for tingling, loss of consciousness, weakness, light-headedness, numbness and headaches.  Hematological: Negative.  Negative for adenopathy. Does not bruise/bleed easily.  Psychiatric/Behavioral: Negative.  Negative for memory loss.       Objective:   Physical Exam  Vitals reviewed. Constitutional: She is oriented to person, place, and time. She appears well-developed and well-nourished.  Non-toxic  appearance. She does not have a sickly appearance. She does not appear ill. No distress.  HENT:  Head: Normocephalic and atraumatic.  Mouth/Throat: Oropharynx is clear and moist. No oropharyngeal exudate.  Eyes: Conjunctivae are normal. Right eye exhibits no discharge. Left eye exhibits no discharge. No scleral icterus.  Neck: Normal range of motion. Neck supple. No JVD present. No tracheal deviation present. No thyromegaly present.  Cardiovascular: Normal rate, regular rhythm, normal heart sounds and intact distal pulses.  Exam reveals no gallop and no friction rub.   No murmur heard. Pulmonary/Chest: Effort normal and breath sounds normal. No accessory muscle usage or stridor. Not tachypneic. No respiratory distress. She has no decreased breath sounds. She has no wheezes. She has no rhonchi. She has no rales. Chest wall is not dull to percussion. She exhibits tenderness and bony tenderness. She exhibits no mass, no laceration, no crepitus, no edema, no deformity, no swelling and no retraction.    Abdominal: Soft. Bowel sounds are normal. She exhibits no distension and no mass. There is no tenderness. There is no rebound and no guarding.  Musculoskeletal: Normal range of motion. She exhibits no edema and no tenderness.       Left shoulder: She exhibits deformity. She exhibits normal range of motion, no tenderness, no bony tenderness, no swelling, no effusion, no crepitus, no laceration, no pain, no spasm, normal pulse and normal strength.       Arms: Lymphadenopathy:    She has no cervical adenopathy.  Neurological: She is oriented to person, place, and time.  Skin: Skin is warm and dry. No rash noted. She is not diaphoretic. No erythema. No pallor.  Psychiatric: She has a normal mood and affect. Her behavior is normal. Judgment and thought content normal.  Assessment & Plan:

## 2012-06-22 NOTE — Telephone Encounter (Signed)
Pt called requesting letter medically clearing her to return to work without restrictions. Letter generated, signed and faxed to pt's employer at [phone] Attn: Pecola Lawless

## 2012-06-22 NOTE — Assessment & Plan Note (Signed)
Her CXR is normal She will continue taking her current meds

## 2012-06-22 NOTE — Telephone Encounter (Signed)
It was normal

## 2012-06-22 NOTE — Telephone Encounter (Signed)
Pt called requesting results of recent x-ray, please advise

## 2012-06-22 NOTE — Telephone Encounter (Signed)
Pt advised.

## 2013-04-17 ENCOUNTER — Encounter: Payer: Self-pay | Admitting: Internal Medicine

## 2013-04-17 ENCOUNTER — Ambulatory Visit (INDEPENDENT_AMBULATORY_CARE_PROVIDER_SITE_OTHER): Payer: 59 | Admitting: Internal Medicine

## 2013-04-17 ENCOUNTER — Other Ambulatory Visit (INDEPENDENT_AMBULATORY_CARE_PROVIDER_SITE_OTHER): Payer: 59

## 2013-04-17 VITALS — BP 132/78 | HR 68 | Temp 98.4°F | Resp 16 | Wt 123.0 lb

## 2013-04-17 DIAGNOSIS — Z1231 Encounter for screening mammogram for malignant neoplasm of breast: Secondary | ICD-10-CM

## 2013-04-17 DIAGNOSIS — Z0000 Encounter for general adult medical examination without abnormal findings: Secondary | ICD-10-CM

## 2013-04-17 DIAGNOSIS — I10 Essential (primary) hypertension: Secondary | ICD-10-CM

## 2013-04-17 DIAGNOSIS — Z1211 Encounter for screening for malignant neoplasm of colon: Secondary | ICD-10-CM | POA: Insufficient documentation

## 2013-04-17 DIAGNOSIS — K219 Gastro-esophageal reflux disease without esophagitis: Secondary | ICD-10-CM | POA: Insufficient documentation

## 2013-04-17 LAB — CBC WITH DIFFERENTIAL/PLATELET
BASOS PCT: 0.6 % (ref 0.0–3.0)
Basophils Absolute: 0 10*3/uL (ref 0.0–0.1)
EOS ABS: 0.1 10*3/uL (ref 0.0–0.7)
Eosinophils Relative: 2.4 % (ref 0.0–5.0)
HEMATOCRIT: 43 % (ref 36.0–46.0)
Hemoglobin: 14.4 g/dL (ref 12.0–15.0)
LYMPHS ABS: 2 10*3/uL (ref 0.7–4.0)
Lymphocytes Relative: 37.8 % (ref 12.0–46.0)
MCHC: 33.6 g/dL (ref 30.0–36.0)
MCV: 97.4 fl (ref 78.0–100.0)
MONO ABS: 0.4 10*3/uL (ref 0.1–1.0)
Monocytes Relative: 8.2 % (ref 3.0–12.0)
NEUTROS PCT: 51 % (ref 43.0–77.0)
Neutro Abs: 2.6 10*3/uL (ref 1.4–7.7)
Platelets: 239 10*3/uL (ref 150.0–400.0)
RBC: 4.42 Mil/uL (ref 3.87–5.11)
RDW: 13.8 % (ref 11.5–14.6)
WBC: 5.2 10*3/uL (ref 4.5–10.5)

## 2013-04-17 LAB — URINALYSIS, ROUTINE W REFLEX MICROSCOPIC
BILIRUBIN URINE: NEGATIVE
HGB URINE DIPSTICK: NEGATIVE
KETONES UR: NEGATIVE
Leukocytes, UA: NEGATIVE
Nitrite: NEGATIVE
Specific Gravity, Urine: <=1.005 — AB (ref 1.000–1.030)
Total Protein, Urine: NEGATIVE
URINE GLUCOSE: NEGATIVE
UROBILINOGEN UA: 0.2 (ref 0.0–1.0)
pH: 6 (ref 5.0–8.0)

## 2013-04-17 LAB — COMPREHENSIVE METABOLIC PANEL
ALBUMIN: 4.3 g/dL (ref 3.5–5.2)
ALK PHOS: 44 U/L (ref 39–117)
ALT: 18 U/L (ref 0–35)
AST: 25 U/L (ref 0–37)
BILIRUBIN TOTAL: 0.7 mg/dL (ref 0.3–1.2)
BUN: 10 mg/dL (ref 6–23)
CO2: 27 mEq/L (ref 19–32)
Calcium: 9.4 mg/dL (ref 8.4–10.5)
Chloride: 109 mEq/L (ref 96–112)
Creatinine, Ser: 0.8 mg/dL (ref 0.4–1.2)
GFR: 80.18 mL/min (ref >60.00)
Glucose, Bld: 95 mg/dL (ref 70–99)
POTASSIUM: 4 meq/L (ref 3.5–5.1)
SODIUM: 142 meq/L (ref 135–145)
TOTAL PROTEIN: 6.9 g/dL (ref 6.0–8.3)

## 2013-04-17 LAB — LIPID PANEL
Cholesterol: 182 mg/dL (ref 0–200)
HDL: 87.9 mg/dL (ref >39.00)
LDL CALC: 86 mg/dL (ref 0–99)
Total CHOL/HDL Ratio: 2
Triglycerides: 40 mg/dL (ref 0.0–149.0)
VLDL: 8 mg/dL (ref 0.0–40.0)

## 2013-04-17 LAB — TSH: TSH: 1.77 u[IU]/mL (ref 0.35–5.50)

## 2013-04-17 NOTE — Patient Instructions (Signed)
Preventive Care for Adults, Female A healthy lifestyle and preventive care can promote health and wellness. Preventive health guidelines for women include the following key practices.  A routine yearly physical is a good way to check with your health care provider about your health and preventive screening. It is a chance to share any concerns and updates on your health and to receive a thorough exam.  Visit your dentist for a routine exam and preventive care every 6 months. Brush your teeth twice a day and floss once a day. Good oral hygiene prevents tooth decay and gum disease.  The frequency of eye exams is based on your age, health, family medical history, use of contact lenses, and other factors. Follow your health care provider's recommendations for frequency of eye exams.  Eat a healthy diet. Foods like vegetables, fruits, whole grains, low-fat dairy products, and lean protein foods contain the nutrients you need without too many calories. Decrease your intake of foods high in solid fats, added sugars, and salt. Eat the right amount of calories for you.Get information about a proper diet from your health care provider, if necessary.  Regular physical exercise is one of the most important things you can do for your health. Most adults should get at least 150 minutes of moderate-intensity exercise (any activity that increases your heart rate and causes you to sweat) each week. In addition, most adults need muscle-strengthening exercises on 2 or more days a week.  Maintain a healthy weight. The body mass index (BMI) is a screening tool to identify possible weight problems. It provides an estimate of body fat based on height and weight. Your health care provider can find your BMI, and can help you achieve or maintain a healthy weight.For adults 20 years and older:  A BMI below 18.5 is considered underweight.  A BMI of 18.5 to 24.9 is normal.  A BMI of 25 to 29.9 is considered overweight.  A  BMI of 30 and above is considered obese.  Maintain normal blood lipids and cholesterol levels by exercising and minimizing your intake of saturated fat. Eat a balanced diet with plenty of fruit and vegetables. Blood tests for lipids and cholesterol should begin at age 62 and be repeated every 5 years. If your lipid or cholesterol levels are high, you are over 50, or you are at high risk for heart disease, you may need your cholesterol levels checked more frequently.Ongoing high lipid and cholesterol levels should be treated with medicines if diet and exercise are not working.  If you smoke, find out from your health care provider how to quit. If you do not use tobacco, do not start.  Lung cancer screening is recommended for adults aged 36 80 years who are at high risk for developing lung cancer because of a history of smoking. A yearly low-dose CT scan of the lungs is recommended for people who have at least a 30-pack-year history of smoking and are a current smoker or have quit within the past 15 years. A pack year of smoking is smoking an average of 1 pack of cigarettes a day for 1 year (for example: 1 pack a day for 30 years or 2 packs a day for 15 years). Yearly screening should continue until the smoker has stopped smoking for at least 15 years. Yearly screening should be stopped for people who develop a health problem that would prevent them from having lung cancer treatment.  If you are pregnant, do not drink alcohol. If you  are breastfeeding, be very cautious about drinking alcohol. If you are not pregnant and choose to drink alcohol, do not have more than 1 drink per day. One drink is considered to be 12 ounces (355 mL) of beer, 5 ounces (148 mL) of wine, or 1.5 ounces (44 mL) of liquor.  Avoid use of street drugs. Do not share needles with anyone. Ask for help if you need support or instructions about stopping the use of drugs.  High blood pressure causes heart disease and increases the risk  of stroke. Your blood pressure should be checked at least every 1 to 2 years. Ongoing high blood pressure should be treated with medicines if weight loss and exercise do not work.  If you are 39 52 years old, ask your health care provider if you should take aspirin to prevent strokes.  Diabetes screening involves taking a blood sample to check your fasting blood sugar level. This should be done once every 3 years, after age 56, if you are within normal weight and without risk factors for diabetes. Testing should be considered at a younger age or be carried out more frequently if you are overweight and have at least 1 risk factor for diabetes.  Breast cancer screening is essential preventive care for women. You should practice "breast self-awareness." This means understanding the normal appearance and feel of your breasts and may include breast self-examination. Any changes detected, no matter how small, should be reported to a health care provider. Women in their 40s and 30s should have a clinical breast exam (CBE) by a health care provider as part of a regular health exam every 1 to 3 years. After age 28, women should have a CBE every year. Starting at age 72, women should consider having a mammogram (breast X-ray test) every year. Women who have a family history of breast cancer should talk to their health care provider about genetic screening. Women at a high risk of breast cancer should talk to their health care providers about having an MRI and a mammogram every year.  Breast cancer gene (BRCA)-related cancer risk assessment is recommended for women who have family members with BRCA-related cancers. BRCA-related cancers include breast, ovarian, tubal, and peritoneal cancers. Having family members with these cancers may be associated with an increased risk for harmful changes (mutations) in the breast cancer genes BRCA1 and BRCA2. Results of the assessment will determine the need for genetic counseling  and BRCA1 and BRCA2 testing.  The Pap test is a screening test for cervical cancer. A Pap test can show cell changes on the cervix that might become cervical cancer if left untreated. A Pap test is a procedure in which cells are obtained and examined from the lower end of the uterus (cervix).  Women should have a Pap test starting at age 59.  Between ages 42 and 13, Pap tests should be repeated every 2 years.  Beginning at age 53, you should have a Pap test every 3 years as long as the past 3 Pap tests have been normal.  Some women have medical problems that increase the chance of getting cervical cancer. Talk to your health care provider about these problems. It is especially important to talk to your health care provider if a new problem develops soon after your last Pap test. In these cases, your health care provider may recommend more frequent screening and Pap tests.  The above recommendations are the same for women who have or have not gotten the vaccine  for human papillomavirus (HPV).  If you had a hysterectomy for a problem that was not cancer or a condition that could lead to cancer, then you no longer need Pap tests. Even if you no longer need a Pap test, a regular exam is a good idea to make sure no other problems are starting.  If you are between ages 58 and 10 years, and you have had normal Pap tests going back 10 years, you no longer need Pap tests. Even if you no longer need a Pap test, a regular exam is a good idea to make sure no other problems are starting.  If you have had past treatment for cervical cancer or a condition that could lead to cancer, you need Pap tests and screening for cancer for at least 20 years after your treatment.  If Pap tests have been discontinued, risk factors (such as a new sexual partner) need to be reassessed to determine if screening should be resumed.  The HPV test is an additional test that may be used for cervical cancer screening. The HPV test  looks for the virus that can cause the cell changes on the cervix. The cells collected during the Pap test can be tested for HPV. The HPV test could be used to screen women aged 67 years and older, and should be used in women of any age who have unclear Pap test results. After the age of 65, women should have HPV testing at the same frequency as a Pap test.  Colorectal cancer can be detected and often prevented. Most routine colorectal cancer screening begins at the age of 25 years and continues through age 66 years. However, your health care provider may recommend screening at an earlier age if you have risk factors for colon cancer. On a yearly basis, your health care provider may provide home test kits to check for hidden blood in the stool. Use of a small camera at the end of a tube, to directly examine the colon (sigmoidoscopy or colonoscopy), can detect the earliest forms of colorectal cancer. Talk to your health care provider about this at age 79, when routine screening begins. Direct exam of the colon should be repeated every 5 10 years through age 47 years, unless early forms of pre-cancerous polyps or small growths are found.  People who are at an increased risk for hepatitis B should be screened for this virus. You are considered at high risk for hepatitis B if:  You were born in a country where hepatitis B occurs often. Talk with your health care provider about which countries are considered high risk.  Your parents were born in a high-risk country and you have not received a shot to protect against hepatitis B (hepatitis B vaccine).  You have HIV or AIDS.  You use needles to inject street drugs.  You live with, or have sex with, someone who has Hepatitis B.  You get hemodialysis treatment.  You take certain medicines for conditions like cancer, organ transplantation, and autoimmune conditions.  Hepatitis C blood testing is recommended for all people born from [birth year] through 1965 and  any individual with known risks for hepatitis C.  Practice safe sex. Use condoms and avoid high-risk sexual practices to reduce the spread of sexually transmitted infections (STIs). STIs include gonorrhea, chlamydia, syphilis, trichomonas, herpes, HPV, and human immunodeficiency virus (HIV). Herpes, HIV, and HPV are viral illnesses that have no cure. They can result in disability, cancer, and death. Sexually active women aged 66  years and younger should be checked for chlamydia. Older women with new or multiple partners should also be tested for chlamydia. Testing for other STIs is recommended if you are sexually active and at increased risk.  Osteoporosis is a disease in which the bones lose minerals and strength with aging. This can result in serious bone fractures or breaks. The risk of osteoporosis can be identified using a bone density scan. Women ages 18 years and over and women at risk for fractures or osteoporosis should discuss screening with their health care providers. Ask your health care provider whether you should take a calcium supplement or vitamin D to reduce the rate of osteoporosis.  Menopause can be associated with physical symptoms and risks. Hormone replacement therapy is available to decrease symptoms and risks. You should talk to your health care provider about whether hormone replacement therapy is right for you.  Use sunscreen. Apply sunscreen liberally and repeatedly throughout the day. You should seek shade when your shadow is shorter than you. Protect yourself by wearing long sleeves, pants, a wide-brimmed hat, and sunglasses year round, whenever you are outdoors.  Once a month, do a whole body skin exam, using a mirror to look at the skin on your back. Tell your health care provider of new moles, moles that have irregular borders, moles that are larger than a pencil eraser, or moles that have changed in shape or color.  Stay current with required vaccines  (immunizations).  Influenza vaccine. All adults should be immunized every year.  Tetanus, diphtheria, and acellular pertussis (Td, Tdap) vaccine. Pregnant women should receive 1 dose of Tdap vaccine during each pregnancy. The dose should be obtained regardless of the length of time since the last dose. Immunization is preferred during the 27th 36th week of gestation. An adult who has not previously received Tdap or who does not know her vaccine status should receive 1 dose of Tdap. This initial dose should be followed by tetanus and diphtheria toxoids (Td) booster doses every 10 years. Adults with an unknown or incomplete history of completing a 3-dose immunization series with Td-containing vaccines should begin or complete a primary immunization series including a Tdap dose. Adults should receive a Td booster every 10 years.  Varicella vaccine. An adult without evidence of immunity to varicella should receive 2 doses or a second dose if she has previously received 1 dose. Pregnant females who do not have evidence of immunity should receive the first dose after pregnancy. This first dose should be obtained before leaving the health care facility. The second dose should be obtained 4 8 weeks after the first dose.  Human papillomavirus (HPV) vaccine. Females aged 9 26 years who have not received the vaccine previously should obtain the 3-dose series. The vaccine is not recommended for use in pregnant females. However, pregnancy testing is not needed before receiving a dose. If a female is found to be pregnant after receiving a dose, no treatment is needed. In that case, the remaining doses should be delayed until after the pregnancy. Immunization is recommended for any person with an immunocompromised condition through the age of 51 years if she did not get any or all doses earlier. During the 3-dose series, the second dose should be obtained 4 8 weeks after the first dose. The third dose should be obtained  24 weeks after the first dose and 16 weeks after the second dose.  Zoster vaccine. One dose is recommended for adults aged 57 years or older unless certain  conditions are present.  Measles, mumps, and rubella (MMR) vaccine. Adults born before [birth year] generally are considered immune to measles and mumps. Adults born in [birth year] or later should have 1 or more doses of MMR vaccine unless there is a contraindication to the vaccine or there is laboratory evidence of immunity to each of the three diseases. A routine second dose of MMR vaccine should be obtained at least 28 days after the first dose for students attending postsecondary schools, health care workers, or international travelers. People who received inactivated measles vaccine or an unknown type of measles vaccine during 1963 1967 should receive 2 doses of MMR vaccine. People who received inactivated mumps vaccine or an unknown type of mumps vaccine before 1979 and are at high risk for mumps infection should consider immunization with 2 doses of MMR vaccine. For females of childbearing age, rubella immunity should be determined. If there is no evidence of immunity, females who are not pregnant should be vaccinated. If there is no evidence of immunity, females who are pregnant should delay immunization until after pregnancy. Unvaccinated health care workers born before [birth year] who lack laboratory evidence of measles, mumps, or rubella immunity or laboratory confirmation of disease should consider measles and mumps immunization with 2 doses of MMR vaccine or rubella immunization with 1 dose of MMR vaccine.  Pneumococcal 13-valent conjugate (PCV13) vaccine. When indicated, a person who is uncertain of her immunization history and has no record of immunization should receive the PCV13 vaccine. An adult aged 42 years or older who has certain medical conditions and has not been previously immunized should receive 1 dose of PCV13 vaccine. This PCV13 should be followed  with a dose of pneumococcal polysaccharide (PPSV23) vaccine. The PPSV23 vaccine dose should be obtained at least 8 weeks after the dose of PCV13 vaccine. An adult aged 4 years or older who has certain medical conditions and previously received 1 or more doses of PPSV23 vaccine should receive 1 dose of PCV13. The PCV13 vaccine dose should be obtained 1 or more years after the last PPSV23 vaccine dose.  Pneumococcal polysaccharide (PPSV23) vaccine. When PCV13 is also indicated, PCV13 should be obtained first. All adults aged 27 years and older should be immunized. An adult younger than age 33 years who has certain medical conditions should be immunized. Any person who resides in a nursing home or long-term care facility should be immunized. An adult smoker should be immunized. People with an immunocompromised condition and certain other conditions should receive both PCV13 and PPSV23 vaccines. People with human immunodeficiency virus (HIV) infection should be immunized as soon as possible after diagnosis. Immunization during chemotherapy or radiation therapy should be avoided. Routine use of PPSV23 vaccine is not recommended for American Indians, [place] Natives, or people younger than 65 years unless there are medical conditions that require PPSV23 vaccine. When indicated, people who have unknown immunization and have no record of immunization should receive PPSV23 vaccine. One-time revaccination 5 years after the first dose of PPSV23 is recommended for people aged 13 64 years who have chronic kidney failure, nephrotic syndrome, asplenia, or immunocompromised conditions. People who received 1 2 doses of PPSV23 before age 66 years should receive another dose of PPSV23 vaccine at age 27 years or later if at least 5 years have passed since the previous dose. Doses of PPSV23 are not needed for people immunized with PPSV23 at or after age 33 years.  Meningococcal vaccine. Adults with asplenia or persistent complement  component deficiencies should receive 2  doses of quadrivalent meningococcal conjugate (MenACWY-D) vaccine. The doses should be obtained at least 2 months apart. Microbiologists working with certain meningococcal bacteria, [place] recruits, people at risk during an outbreak, and people who travel to or live in countries with a high rate of meningitis should be immunized. A first-year college student up through age 49 years who is living in a residence hall should receive a dose if she did not receive a dose on or after her 16th birthday. Adults who have certain high-risk conditions should receive one or more doses of vaccine.  Hepatitis A vaccine. Adults who wish to be protected from this disease, have certain high-risk conditions, work with hepatitis A-infected animals, work in hepatitis A research labs, or travel to or work in countries with a high rate of hepatitis A should be immunized. Adults who were previously unvaccinated and who anticipate close contact with an international adoptee during the first 60 days after arrival in the [place] from a country with a high rate of hepatitis A should be immunized.  Hepatitis B vaccine. Adults who wish to be protected from this disease, have certain high-risk conditions, may be exposed to blood or other infectious body fluids, are household contacts or sex partners of hepatitis B positive people, are clients or workers in certain care facilities, or travel to or work in countries with a high rate of hepatitis B should be immunized.  Haemophilus influenzae type b (Hib) vaccine. A previously unvaccinated person with asplenia or sickle cell disease or having a scheduled splenectomy should receive 1 dose of Hib vaccine. Regardless of previous immunization, a recipient of a hematopoietic stem cell transplant should receive a 3-dose series 6 12 months after her successful transplant. Hib vaccine is not recommended for adults with HIV infection. Preventive  Services / Frequency Ages 24 to 39years  Blood pressure check.** / Every 1 to 2 years.  Lipid and cholesterol check.** / Every 5 years beginning at age 66.  Clinical breast exam.** / Every 3 years for women in their 12s and 24s.  BRCA-related cancer risk assessment.** / For women who have family members with a BRCA-related cancer (breast, ovarian, tubal, or peritoneal cancers).  Pap test.** / Every 2 years from ages 31 through 69. Every 3 years starting at age 64 through age 76 or 89 with a history of 3 consecutive normal Pap tests.  HPV screening.** / Every 3 years from ages 10 through ages 10 to 96 with a history of 3 consecutive normal Pap tests.  Hepatitis C blood test.** / For any individual with known risks for hepatitis C.  Skin self-exam. / Monthly.  Influenza vaccine. / Every year.  Tetanus, diphtheria, and acellular pertussis (Tdap, Td) vaccine.** / Consult your health care provider. Pregnant women should receive 1 dose of Tdap vaccine during each pregnancy. 1 dose of Td every 10 years.  Varicella vaccine.** / Consult your health care provider. Pregnant females who do not have evidence of immunity should receive the first dose after pregnancy.  HPV vaccine. / 3 doses over 6 months, if 90 and younger. The vaccine is not recommended for use in pregnant females. However, pregnancy testing is not needed before receiving a dose.  Measles, mumps, rubella (MMR) vaccine.** / You need at least 1 dose of MMR if you were born in 1957 or later. You may also need a 2nd dose. For females of childbearing age, rubella immunity should be determined. If there is no evidence of immunity, females who are not  pregnant should be vaccinated. If there is no evidence of immunity, females who are pregnant should delay immunization until after pregnancy.  Pneumococcal 13-valent conjugate (PCV13) vaccine.** / Consult your health care provider.  Pneumococcal polysaccharide (PPSV23) vaccine.** / 1 to 2  doses if you smoke cigarettes or if you have certain conditions.  Meningococcal vaccine.** / 1 dose if you are age 88 to 6 years and a Market researcher living in a residence hall, or have one of several medical conditions, you need to get vaccinated against meningococcal disease. You may also need additional booster doses.  Hepatitis A vaccine.** / Consult your health care provider.  Hepatitis B vaccine.** / Consult your health care provider.  Haemophilus influenzae type b (Hib) vaccine.** / Consult your health care provider. Ages 23 to 64years  Blood pressure check.** / Every 1 to 2 years.  Lipid and cholesterol check.** / Every 5 years beginning at age 20 years.  Lung cancer screening. / Every year if you are aged 51 80 years and have a 30-pack-year history of smoking and currently smoke or have quit within the past 15 years. Yearly screening is stopped once you have quit smoking for at least 15 years or develop a health problem that would prevent you from having lung cancer treatment.  Clinical breast exam.** / Every year after age 8 years.  BRCA-related cancer risk assessment.** / For women who have family members with a BRCA-related cancer (breast, ovarian, tubal, or peritoneal cancers).  Mammogram.** / Every year beginning at age 10 years and continuing for as long as you are in good health. Consult with your health care provider.  Pap test.** / Every 3 years starting at age 30 years through age 5 or 61 years with a history of 3 consecutive normal Pap tests.  HPV screening.** / Every 3 years from ages 39 years through ages 72 to 19 years with a history of 3 consecutive normal Pap tests.  Fecal occult blood test (FOBT) of stool. / Every year beginning at age 59 years and continuing until age 27 years. You may not need to do this test if you get a colonoscopy every 10 years.  Flexible sigmoidoscopy or colonoscopy.** / Every 5 years for a flexible sigmoidoscopy or every  10 years for a colonoscopy beginning at age 110 years and continuing until age 63 years.  Hepatitis C blood test.** / For all people born from [birth year] through 1964 and any individual with known risks for hepatitis C.  Skin self-exam. / Monthly.  Influenza vaccine. / Every year.  Tetanus, diphtheria, and acellular pertussis (Tdap/Td) vaccine.** / Consult your health care provider. Pregnant women should receive 1 dose of Tdap vaccine during each pregnancy. 1 dose of Td every 10 years.  Varicella vaccine.** / Consult your health care provider. Pregnant females who do not have evidence of immunity should receive the first dose after pregnancy.  Zoster vaccine.** / 1 dose for adults aged 46 years or older.  Measles, mumps, rubella (MMR) vaccine.** / You need at least 1 dose of MMR if you were born in 1956 or later. You may also need a 2nd dose. For females of childbearing age, rubella immunity should be determined. If there is no evidence of immunity, females who are not pregnant should be vaccinated. If there is no evidence of immunity, females who are pregnant should delay immunization until after pregnancy.  Pneumococcal 13-valent conjugate (PCV13) vaccine.** / Consult your health care provider.  Pneumococcal polysaccharide (PPSV23) vaccine.** / 1  to 2 doses if you smoke cigarettes or if you have certain conditions.  Meningococcal vaccine.** / Consult your health care provider.  Hepatitis A vaccine.** / Consult your health care provider.  Hepatitis B vaccine.** / Consult your health care provider.  Haemophilus influenzae type b (Hib) vaccine.** / Consult your health care provider. Ages 65 years and over  Blood pressure check.** / Every 1 to 2 years.  Lipid and cholesterol check.** / Every 5 years beginning at age 35 years.  Lung cancer screening. / Every year if you are aged 38 80 years and have a 30-pack-year history of smoking and currently smoke or have quit within the past 15 years.  Yearly screening is stopped once you have quit smoking for at least 15 years or develop a health problem that would prevent you from having lung cancer treatment.  Clinical breast exam.** / Every year after age 44 years.  BRCA-related cancer risk assessment.** / For women who have family members with a BRCA-related cancer (breast, ovarian, tubal, or peritoneal cancers).  Mammogram.** / Every year beginning at age 71 years and continuing for as long as you are in good health. Consult with your health care provider.  Pap test.** / Every 3 years starting at age 38 years through age 15 or 66 years with 3 consecutive normal Pap tests. Testing can be stopped between 65 and 70 years with 3 consecutive normal Pap tests and no abnormal Pap or HPV tests in the past 10 years.  HPV screening.** / Every 3 years from ages 85 years through ages 25 or 16 years with a history of 3 consecutive normal Pap tests. Testing can be stopped between 65 and 70 years with 3 consecutive normal Pap tests and no abnormal Pap or HPV tests in the past 10 years.  Fecal occult blood test (FOBT) of stool. / Every year beginning at age 82 years and continuing until age 50 years. You may not need to do this test if you get a colonoscopy every 10 years.  Flexible sigmoidoscopy or colonoscopy.** / Every 5 years for a flexible sigmoidoscopy or every 10 years for a colonoscopy beginning at age 47 years and continuing until age 79 years.  Hepatitis C blood test.** / For all people born from [birth year] through 1964 and any individual with known risks for hepatitis C.  Osteoporosis screening.** / A one-time screening for women ages 32 years and over and women at risk for fractures or osteoporosis.  Skin self-exam. / Monthly.  Influenza vaccine. / Every year.  Tetanus, diphtheria, and acellular pertussis (Tdap/Td) vaccine.** / 1 dose of Td every 10 years.  Varicella vaccine.** / Consult your health care provider.  Zoster vaccine.** / 1  dose for adults aged 17 years or older.  Pneumococcal 13-valent conjugate (PCV13) vaccine.** / Consult your health care provider.  Pneumococcal polysaccharide (PPSV23) vaccine.** / 1 dose for all adults aged 74 years and older.  Meningococcal vaccine.** / Consult your health care provider.  Hepatitis A vaccine.** / Consult your health care provider.  Hepatitis B vaccine.** / Consult your health care provider.  Haemophilus influenzae type b (Hib) vaccine.** / Consult your health care provider. ** Family history and personal history of risk and conditions may change your health care provider's recommendations. Document Released: 05/18/2001 Document Revised: 01/10/2013 Document Reviewed: 08/17/2010 [facility] Patient Information 2014 [place], [place]. Gastroesophageal Reflux Disease, Adult Gastroesophageal reflux disease (GERD) happens when acid from your stomach flows up into the esophagus. When acid comes in contact with  the esophagus, the acid causes soreness (inflammation) in the esophagus. Over time, GERD may create small holes (ulcers) in the lining of the esophagus. CAUSES   Increased body weight. This puts pressure on the stomach, making acid rise from the stomach into the esophagus.  Smoking. This increases acid production in the stomach.  Drinking alcohol. This causes decreased pressure in the lower esophageal sphincter (valve or ring of muscle between the esophagus and stomach), allowing acid from the stomach into the esophagus.  Late evening meals and a full stomach. This increases pressure and acid production in the stomach.  A malformed lower esophageal sphincter. Sometimes, no cause is found. SYMPTOMS   Burning pain in the lower part of the mid-chest behind the breastbone and in the mid-stomach area. This may occur twice a week or more often.  Trouble swallowing.  Sore throat.  Dry cough.  Asthma-like symptoms including chest tightness, shortness of breath, or  wheezing. DIAGNOSIS  Your caregiver may be able to diagnose GERD based on your symptoms. In some cases, X-rays and other tests may be done to check for complications or to check the condition of your stomach and esophagus. TREATMENT  Your caregiver may recommend over-the-counter or prescription medicines to help decrease acid production. Ask your caregiver before starting or adding any new medicines.  HOME CARE INSTRUCTIONS   Change the factors that you can control. Ask your caregiver for guidance concerning weight loss, quitting smoking, and alcohol consumption.  Avoid foods and drinks that make your symptoms worse, such as:  Caffeine or alcoholic drinks.  Chocolate.  Peppermint or mint flavorings.  Garlic and onions.  Spicy foods.  Citrus fruits, such as oranges, lemons, or limes.  Tomato-based foods such as sauce, chili, salsa, and pizza.  Fried and fatty foods.  Avoid lying down for the 3 hours prior to your bedtime or prior to taking a nap.  Eat small, frequent meals instead of large meals.  Wear loose-fitting clothing. Do not wear anything tight around your waist that causes pressure on your stomach.  Raise the head of your bed 6 to 8 inches with wood blocks to help you sleep. Extra pillows will not help.  Only take over-the-counter or prescription medicines for pain, discomfort, or fever as directed by your caregiver.  Do not take aspirin, ibuprofen, or other nonsteroidal anti-inflammatory drugs (NSAIDs). SEEK IMMEDIATE MEDICAL CARE IF:   You have pain in your arms, neck, jaw, teeth, or back.  Your pain increases or changes in intensity or duration.  You develop nausea, vomiting, or sweating (diaphoresis).  You develop shortness of breath, or you faint.  Your vomit is green, yellow, black, or looks like coffee grounds or blood.  Your stool is red, bloody, or black. These symptoms could be signs of other problems, such as heart disease, gastric bleeding, or  esophageal bleeding. MAKE SURE YOU:   Understand these instructions.  Will watch your condition.  Will get help right away if you are not doing well or get worse. Document Released: 12/30/2004 Document Revised: 06/14/2011 Document Reviewed: 10/09/2010 [facility] Patient Information 2014 [place], [place].

## 2013-04-17 NOTE — Progress Notes (Signed)
Pre-visit discussion using our clinic review tool, as applicable. No additional management support is needed unless otherwise documented below in the visit note.  

## 2013-04-17 NOTE — Progress Notes (Signed)
Subjective:    Patient ID: Paula Parker, female    DOB: Dec 15, 1960, 53 y.o.   MRN: [id number]  Gastrophageal Reflux She complains of heartburn. She reports no abdominal pain, no belching, no chest pain, no choking, no coughing, no dysphagia, no early satiety, no globus sensation, no hoarse voice, no nausea, no sore throat, no stridor, no tooth decay, no water brash or no wheezing. This is a recurrent problem. The current episode started more than 1 year ago. The problem occurs occasionally. The problem has been unchanged. The heartburn duration is several minutes. The heartburn is located in the substernum. The heartburn is of mild intensity. The heartburn does not wake her from sleep. The heartburn does not limit her activity. The heartburn doesn't change with position. The symptoms are aggravated by smoking. Pertinent negatives include no anemia, fatigue, melena, muscle weakness, orthopnea or weight loss. Risk factors include smoking/tobacco exposure. She has tried an antacid for the symptoms. The treatment provided moderate relief. Past procedures include an EGD.      Review of Systems  Constitutional: Negative.  Negative for fever, chills, weight loss, diaphoresis, appetite change and fatigue.  HENT: Negative.  Negative for hoarse voice and sore throat.   Eyes: Negative.   Respiratory: Negative.  Negative for cough, choking, shortness of breath, wheezing and stridor.   Cardiovascular: Negative.  Negative for chest pain, palpitations and leg swelling.  Gastrointestinal: Positive for heartburn. Negative for dysphagia, nausea, vomiting, abdominal pain, diarrhea, constipation and melena.  Endocrine: Negative.   Genitourinary: Negative.   Musculoskeletal: Negative.  Negative for muscle weakness.  Skin: Negative.   Allergic/Immunologic: Negative.   Neurological: Negative.  Negative for dizziness, syncope, speech difficulty, weakness, light-headedness, numbness and headaches.  Hematological:  Negative.  Negative for adenopathy. Does not bruise/bleed easily.  Psychiatric/Behavioral: Negative.        Objective:   Physical Exam  Vitals reviewed. Constitutional: She is oriented to person, place, and time. She appears well-developed and well-nourished. No distress.  HENT:  Head: Normocephalic and atraumatic.  Mouth/Throat: Oropharynx is clear and moist. No oropharyngeal exudate.  Eyes: Conjunctivae are normal. Right eye exhibits no discharge. Left eye exhibits no discharge. No scleral icterus.  Neck: Normal range of motion. Neck supple. No JVD present. No tracheal deviation present. No thyromegaly present.  Cardiovascular: Normal rate, regular rhythm, normal heart sounds and intact distal pulses.  Exam reveals no gallop and no friction rub.   No murmur heard. Pulmonary/Chest: Effort normal and breath sounds normal. No stridor. No respiratory distress. She has no wheezes. She has no rales. She exhibits no mass and no tenderness. Right breast exhibits no inverted nipple, no mass, no nipple discharge, no skin change and no tenderness. Left breast exhibits no inverted nipple, no mass, no nipple discharge, no skin change and no tenderness. Breasts are symmetrical.  Abdominal: Soft. Bowel sounds are normal. She exhibits no distension and no mass. There is no tenderness. There is no rebound and no guarding.  Musculoskeletal: Normal range of motion. She exhibits no edema and no tenderness.  Lymphadenopathy:    She has no cervical adenopathy.  Neurological: She is oriented to person, place, and time.  Skin: Skin is warm and dry. No rash noted. She is not diaphoretic. No erythema. No pallor.  Psychiatric: She has a normal mood and affect. Her behavior is normal. Judgment and thought content normal.     Lab Results  Component Value Date   WBC 5.4 01/24/2012   HGB 15.0 01/24/2012  HCT 45.7 01/24/2012   PLT 230.0 01/24/2012   GLUCOSE 88 01/24/2012   CHOL 170 01/24/2012   TRIG 41.0  01/24/2012   HDL 81.50 01/24/2012   LDLCALC 80 01/24/2012   ALT 18 01/24/2012   AST 25 01/24/2012   NA 144 01/24/2012   K 3.9 01/24/2012   CL 107 01/24/2012   CREATININE 0.8 01/24/2012   BUN 20 01/24/2012   CO2 30 01/24/2012   TSH 2.59 01/24/2012        Assessment & Plan:

## 2013-04-18 ENCOUNTER — Encounter: Payer: Self-pay | Admitting: Internal Medicine

## 2013-04-18 ENCOUNTER — Telehealth: Payer: Self-pay | Admitting: *Deleted

## 2013-04-18 LAB — HEPATITIS C ANTIBODY: HCV AB: NEGATIVE

## 2013-04-18 NOTE — Telephone Encounter (Signed)
A letter has been sent

## 2013-04-18 NOTE — Telephone Encounter (Signed)
Notified patient of MD response that she should receive her lab results via [place].

## 2013-04-18 NOTE — Telephone Encounter (Signed)
Patient phoned requesting lab results that were drawn yesterday 04/17/13.  Please advise.  CB# [phone]

## 2013-04-18 NOTE — Assessment & Plan Note (Signed)
She will cont tums as needed

## 2013-04-18 NOTE — Assessment & Plan Note (Signed)
Exam done She refused a flu vax She was referred to GI for a screening colonoscopy Mammogram ordered Labs ordered Pt ed material was given

## 2013-04-18 NOTE — Assessment & Plan Note (Signed)
Her BP is well controlled 

## 2013-04-27 ENCOUNTER — Ambulatory Visit (INDEPENDENT_AMBULATORY_CARE_PROVIDER_SITE_OTHER): Payer: 59 | Admitting: Internal Medicine

## 2013-04-27 ENCOUNTER — Encounter: Payer: Self-pay | Admitting: Internal Medicine

## 2013-04-27 ENCOUNTER — Ambulatory Visit (INDEPENDENT_AMBULATORY_CARE_PROVIDER_SITE_OTHER)
Admission: RE | Admit: 2013-04-27 | Discharge: 2013-04-27 | Disposition: A | Discharge status: Home / Self Care | Payer: 59 | Source: Ambulatory Visit | Attending: Internal Medicine | Admitting: Internal Medicine

## 2013-04-27 VITALS — BP 120/84 | HR 95 | Temp 97.6°F | Resp 16 | Ht 61.0 in | Wt 122.2 lb

## 2013-04-27 DIAGNOSIS — R1314 Dysphagia, pharyngoesophageal phase: Secondary | ICD-10-CM | POA: Insufficient documentation

## 2013-04-27 DIAGNOSIS — K219 Gastro-esophageal reflux disease without esophagitis: Secondary | ICD-10-CM

## 2013-04-27 MED ORDER — DEXLANSOPRAZOLE 60 MG PO CPDR: 60.0000 mg | DELAYED_RELEASE_CAPSULE | Freq: Every day | ORAL | Status: DC

## 2013-04-27 NOTE — Progress Notes (Signed)
Pre visit review using our clinic review tool, if applicable. No additional management support is needed unless otherwise documented below in the visit note. 

## 2013-04-27 NOTE — Assessment & Plan Note (Signed)
GI referral to see if she needs upper endoscopy Will check plain films today to look for swelling/FB

## 2013-04-27 NOTE — Patient Instructions (Signed)
Gastroesophageal Reflux Disease, Adult  Gastroesophageal reflux disease (GERD) happens when acid from your stomach flows up into the esophagus. When acid comes in contact with the esophagus, the acid causes soreness (inflammation) in the esophagus. Over time, GERD may create small holes (ulcers) in the lining of the esophagus.  CAUSES   · Increased body weight. This puts pressure on the stomach, making acid rise from the stomach into the esophagus.  · Smoking. This increases acid production in the stomach.  · Drinking alcohol. This causes decreased pressure in the lower esophageal sphincter (valve or ring of muscle between the esophagus and stomach), allowing acid from the stomach into the esophagus.  · Late evening meals and a full stomach. This increases pressure and acid production in the stomach.  · A malformed lower esophageal sphincter.  Sometimes, no cause is found.  SYMPTOMS   · Burning pain in the lower part of the mid-chest behind the breastbone and in the mid-stomach area. This may occur twice a week or more often.  · Trouble swallowing.  · Sore throat.  · Dry cough.  · Asthma-like symptoms including chest tightness, shortness of breath, or wheezing.  DIAGNOSIS   Your caregiver may be able to diagnose GERD based on your symptoms. In some cases, X-rays and other tests may be done to check for complications or to check the condition of your stomach and esophagus.  TREATMENT   Your caregiver may recommend over-the-counter or prescription medicines to help decrease acid production. Ask your caregiver before starting or adding any new medicines.   HOME CARE INSTRUCTIONS   · Change the factors that you can control. Ask your caregiver for guidance concerning weight loss, quitting smoking, and alcohol consumption.  · Avoid foods and drinks that make your symptoms worse, such as:  · Caffeine or alcoholic drinks.  · Chocolate.  · Peppermint or mint flavorings.  · Garlic and onions.  · Spicy foods.  · Citrus fruits,  such as oranges, lemons, or limes.  · Tomato-based foods such as sauce, chili, salsa, and pizza.  · Fried and fatty foods.  · Avoid lying down for the 3 hours prior to your bedtime or prior to taking a nap.  · Eat small, frequent meals instead of large meals.  · Wear loose-fitting clothing. Do not wear anything tight around your waist that causes pressure on your stomach.  · Raise the head of your bed 6 to 8 inches with wood blocks to help you sleep. Extra pillows will not help.  · Only take over-the-counter or prescription medicines for pain, discomfort, or fever as directed by your caregiver.  · Do not take aspirin, ibuprofen, or other nonsteroidal anti-inflammatory drugs (NSAIDs).  SEEK IMMEDIATE MEDICAL CARE IF:   · You have pain in your arms, neck, jaw, teeth, or back.  · Your pain increases or changes in intensity or duration.  · You develop nausea, vomiting, or sweating (diaphoresis).  · You develop shortness of breath, or you faint.  · Your vomit is green, yellow, black, or looks like coffee grounds or blood.  · Your stool is red, bloody, or black.  These symptoms could be signs of other problems, such as heart disease, gastric bleeding, or esophageal bleeding.  MAKE SURE YOU:   · Understand these instructions.  · Will watch your condition.  · Will get help right away if you are not doing well or get worse.  Document Released: 12/30/2004 Document Revised: 06/14/2011 Document Reviewed: 10/09/2010  [employer]® Patient   Information ©2014 [employer].

## 2013-04-27 NOTE — Progress Notes (Signed)
Subjective:    Patient ID: Paula Parker, female    DOB: Oct 16, 1960, 53 y.o.   MRN: [id number]  HPI Comments: She returns and tells me that she feels like meats are getting stick in her throat when she tries to swallow - there has been some discomfort on the left side for 4 days. Liquids have not been a problem.  Gastrophageal Reflux She complains of belching, dysphagia and heartburn. She reports no abdominal pain, no chest pain, no choking, no coughing, no early satiety, no globus sensation, no hoarse voice, no nausea, no sore throat, no stridor, no tooth decay, no water brash or no wheezing. This is a recurrent problem. The current episode started more than 1 year ago. The problem occurs frequently. The problem has been gradually worsening. The heartburn is located in the substernum. The heartburn is of mild intensity. The heartburn does not wake her from sleep. The heartburn does not limit her activity. The heartburn doesn't change with position. Pertinent negatives include no anemia, fatigue, melena, muscle weakness, orthopnea or weight loss. She has tried nothing for the symptoms.      Review of Systems  Constitutional: Negative.  Negative for fever, chills, weight loss, diaphoresis, appetite change and fatigue.  HENT: Negative.  Negative for hoarse voice and sore throat.   Eyes: Negative.   Respiratory: Negative.  Negative for cough, choking, shortness of breath, wheezing and stridor.   Cardiovascular: Negative for chest pain, palpitations and leg swelling.  Gastrointestinal: Positive for heartburn and dysphagia. Negative for nausea, abdominal pain and melena.  Endocrine: Negative.   Genitourinary: Negative.   Musculoskeletal: Negative.  Negative for muscle weakness.  Skin: Negative.   Allergic/Immunologic: Negative.   Neurological: Negative.   Hematological: Negative.  Negative for adenopathy. Does not bruise/bleed easily.  Psychiatric/Behavioral: Negative.        Objective:   Physical Exam  Vitals reviewed. Constitutional: She is oriented to person, place, and time. She appears well-developed and well-nourished.  Non-toxic appearance. She does not have a sickly appearance. She does not appear ill. No distress.  HENT:  Head: Normocephalic and atraumatic.  Mouth/Throat: Oropharynx is clear and moist. No oropharyngeal exudate.  Eyes: Conjunctivae are normal. Right eye exhibits no discharge. Left eye exhibits no discharge. No scleral icterus.  Neck: Trachea normal and normal range of motion. Neck supple. No JVD present. No tracheal tenderness present. No tracheal deviation present. No mass and no thyromegaly present.  Cardiovascular: Normal rate, regular rhythm, normal heart sounds and intact distal pulses.  Exam reveals no gallop and no friction rub.   No murmur heard. Pulmonary/Chest: Effort normal and breath sounds normal. No stridor. No respiratory distress. She has no wheezes. She has no rales. She exhibits no tenderness.  Abdominal: Soft. Bowel sounds are normal. She exhibits no distension and no mass. There is no tenderness. There is no rebound and no guarding.  Musculoskeletal: Normal range of motion. She exhibits no edema and no tenderness.  Lymphadenopathy:       Head (right side): No submental, no submandibular, no tonsillar, no preauricular, no posterior auricular and no occipital adenopathy present.       Head (left side): No submental, no submandibular, no tonsillar, no preauricular, no posterior auricular and no occipital adenopathy present.    She has no cervical adenopathy.       Right cervical: No superficial cervical, no deep cervical and no posterior cervical adenopathy present.      Left cervical: No deep cervical and no  posterior cervical adenopathy present.    She has no axillary adenopathy.       Right: No supraclavicular adenopathy present.       Left: No supraclavicular adenopathy present.  Neurological: She is oriented to person, place, and  time.  Skin: Skin is warm and dry. No rash noted. She is not diaphoretic. No erythema. No pallor.  Psychiatric: She has a normal mood and affect. Her behavior is normal. Judgment and thought content normal.     Lab Results  Component Value Date   WBC 5.2 04/17/2013   HGB 14.4 04/17/2013   HCT 43.0 04/17/2013   PLT 239.0 04/17/2013   GLUCOSE 95 04/17/2013   CHOL 182 04/17/2013   TRIG 40.0 04/17/2013   HDL 87.90 04/17/2013   LDLCALC 86 04/17/2013   ALT 18 04/17/2013   AST 25 04/17/2013   NA 142 04/17/2013   K 4.0 04/17/2013   CL 109 04/17/2013   CREATININE 0.8 04/17/2013   BUN 10 04/17/2013   CO2 27 04/17/2013   TSH 1.77 04/17/2013       Assessment & Plan:

## 2013-04-27 NOTE — Assessment & Plan Note (Signed)
Start dexilant

## 2013-05-10 ENCOUNTER — Encounter: Payer: Self-pay | Admitting: Gastroenterology

## 2013-05-14 ENCOUNTER — Encounter: Payer: Self-pay | Admitting: Internal Medicine

## 2013-06-12 ENCOUNTER — Encounter: Payer: Self-pay | Admitting: Gastroenterology

## 2013-07-19 ENCOUNTER — Encounter: Payer: Self-pay | Admitting: Internal Medicine

## 2013-07-19 ENCOUNTER — Ambulatory Visit (INDEPENDENT_AMBULATORY_CARE_PROVIDER_SITE_OTHER): Payer: 59 | Admitting: Internal Medicine

## 2013-07-19 VITALS — BP 140/70 | HR 83 | Temp 98.5°F | Ht 61.0 in | Wt 127.4 lb

## 2013-07-19 DIAGNOSIS — F17200 Nicotine dependence, unspecified, uncomplicated: Secondary | ICD-10-CM

## 2013-07-19 DIAGNOSIS — J189 Pneumonia, unspecified organism: Secondary | ICD-10-CM | POA: Insufficient documentation

## 2013-07-19 DIAGNOSIS — J309 Allergic rhinitis, unspecified: Secondary | ICD-10-CM

## 2013-07-19 DIAGNOSIS — J069 Acute upper respiratory infection, unspecified: Secondary | ICD-10-CM

## 2013-07-19 MED ORDER — LEVOFLOXACIN 250 MG PO TABS: 250.0000 mg | ORAL_TABLET | Freq: Every day | ORAL | Status: DC

## 2013-07-19 MED ORDER — PREDNISONE 10 MG PO TABS: ORAL_TABLET | ORAL | Status: DC

## 2013-07-19 MED ORDER — TRIAMCINOLONE ACETONIDE 55 MCG/ACT NA AERO: 2.0000 | INHALATION_SPRAY | Freq: Every day | NASAL | Status: DC

## 2013-07-19 MED ORDER — PREDNISONE 10 MG PO TABS
ORAL_TABLET | ORAL | Status: DC
Start: 2013-07-19 — End: 2014-01-22

## 2013-07-19 NOTE — Assessment & Plan Note (Signed)
Mild to mod, for antibx course,  to f/u any worsening symptoms or concerns  - the levaquin

## 2013-07-19 NOTE — Assessment & Plan Note (Signed)
Urged to quit, not ready

## 2013-07-19 NOTE — Assessment & Plan Note (Signed)
More of a long term concern, flares with going to work daily, etiology unclear, for nasacort aq asd, also with marked severe turbinate swelling for predpack asd

## 2013-07-19 NOTE — Progress Notes (Signed)
   Subjective:    Patient ID: Paula Parker, female    DOB: 1961/03/04, 53 y.o.   MRN: [id number]  HPI  Here to f/u with 1-2 wks gradually worsening nasal congestion, swelling, pain/discomfort and one episode small epistaxis after tissue use.  ? Feverish as well, but mostly notices a palpable/visible swelling to right nose as well.  Has hx of MRSA infections to nose, but this doesn't have that small tender bump, more of a diffuse swelling.  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  Sinus/nose seems to swell/congestion when walk in the door for several months.  Allergic to mult antibx. Past Medical History  Diagnosis Date  . Alcoholic fatty liver   . GLAUCOMA, BORDERLINE   . HYPERLIPIDEMIA   . HYPERTENSION   . PALPITATIONS, OCCASIONAL   . SMOKER    Past Surgical History  Procedure Laterality Date  . Cervix lesion destruction  1980'S    reports that she has been smoking Cigarettes.  She has been smoking about 0.00 packs per day. She has never used smokeless tobacco. She reports that she does not drink alcohol or use illicit drugs. family history includes ALS in her mother; Cancer in her sister; Coronary artery disease in her other; Diabetes in her maternal grandfather; Early death in her sister; Heart disease in her sister. There is no history of Stroke, Kidney disease, Hypertension, or Hyperlipidemia. Allergies  Allergen Reactions  . Doxycycline Other (See Comments)    Throat swelling  . Benzonatate Other (See Comments)    unknown  . Sulfonamide Derivatives Other (See Comments)    unknown  . Penicillins Rash   Current Outpatient Prescriptions on File Prior to Visit  Medication Sig Dispense Refill  . dexlansoprazole (DEXILANT) 60 MG capsule Take 1 capsule (60 mg total) by mouth daily.  90 capsule  3   No current facility-administered medications on file prior to visit.   Review of Systems  Constitutional: Negative for unexpected weight change, or unusual diaphoresis  HENT: Negative for  tinnitus.   Eyes: Negative for photophobia and visual disturbance.  Respiratory: Negative for choking and stridor.   Gastrointestinal: Negative for vomiting and blood in stool.  Genitourinary: Negative for hematuria and decreased urine volume.  Musculoskeletal: Negative for acute joint swelling Skin: Negative for color change and wound.  Neurological: Negative for tremors and numbness other than noted  Psychiatric/Behavioral: Negative for decreased concentration or  hyperactivity.       Objective:   Physical Exam BP 140/70  Pulse 83  Temp(Src) 98.5 F (36.9 C) (Oral)  Ht 5' 1"  (1.549 m)  Wt 127 lb 6 oz (57.777 kg)  BMI 24.08 kg/m2  SpO2 97% VS noted,  Constitutional: Pt appears well-developed and well-nourished.  HENT: Head: NCAT.  Right Ear: External ear normal.  Left Ear: External ear normal.  Nose with right side swelling external with mild tender, as well as visible 2+ swollen turbinate, tender, no drainage Eyes: Conjunctivae and EOM are normal. Pupils are equal, round, and reactive to light.  Neck: Normal range of motion. Neck supple.  Cardiovascular: Normal rate and regular rhythm.   Pulmonary/Chest: Effort normal and breath sounds normal.  Neurological: Pt is alert. Not confused  Skin: Skin is warm. No erythema.  Psychiatric: Pt behavior is normal. Thought content normal.     Assessment & Plan:

## 2013-07-19 NOTE — Patient Instructions (Signed)
Please take all new medication as prescribed - the antibiotic, prednisone, and nasacort  You should consider taking the Nasacort daily for the long term to avoid further flares in rhinitis and nose swelling  Please continue all other medications as before, and refills have been done if requested. Please have the pharmacy call with any other refills you may need.  Please stop smoking.

## 2013-07-19 NOTE — Progress Notes (Signed)
Pre visit review using our clinic review tool, if applicable. No additional management support is needed unless otherwise documented below in the visit note. 

## 2013-07-25 ENCOUNTER — Other Ambulatory Visit: Payer: Self-pay | Admitting: Occupational Medicine

## 2013-07-25 ENCOUNTER — Ambulatory Visit: Payer: Self-pay

## 2013-07-25 DIAGNOSIS — M25539 Pain in unspecified wrist: Secondary | ICD-10-CM

## 2013-09-27 ENCOUNTER — Encounter: Payer: Self-pay | Admitting: Internal Medicine

## 2013-09-27 ENCOUNTER — Encounter: Payer: Self-pay | Admitting: Cardiovascular Disease

## 2014-01-22 ENCOUNTER — Encounter: Payer: Self-pay | Admitting: Internal Medicine

## 2014-01-22 ENCOUNTER — Other Ambulatory Visit (INDEPENDENT_AMBULATORY_CARE_PROVIDER_SITE_OTHER): Payer: 59

## 2014-01-22 ENCOUNTER — Ambulatory Visit (INDEPENDENT_AMBULATORY_CARE_PROVIDER_SITE_OTHER): Payer: 59 | Admitting: Internal Medicine

## 2014-01-22 VITALS — BP 140/100 | HR 88 | Temp 98.5°F | Ht 60.0 in | Wt 129.5 lb

## 2014-01-22 DIAGNOSIS — R1031 Right lower quadrant pain: Secondary | ICD-10-CM

## 2014-01-22 DIAGNOSIS — G8929 Other chronic pain: Secondary | ICD-10-CM

## 2014-01-22 DIAGNOSIS — R1013 Epigastric pain: Secondary | ICD-10-CM

## 2014-01-22 DIAGNOSIS — R10813 Right lower quadrant abdominal tenderness: Secondary | ICD-10-CM | POA: Insufficient documentation

## 2014-01-22 LAB — BASIC METABOLIC PANEL
BUN: 13 mg/dL (ref 6–23)
CALCIUM: 9.6 mg/dL (ref 8.4–10.5)
CHLORIDE: 108 meq/L (ref 96–112)
CO2: 21 mEq/L (ref 19–32)
Creatinine, Ser: 0.8 mg/dL (ref 0.4–1.2)
GFR: 76.62 mL/min (ref >60.00)
Glucose, Bld: 89 mg/dL (ref 70–99)
Potassium: 3.9 mEq/L (ref 3.5–5.1)
SODIUM: 142 meq/L (ref 135–145)

## 2014-01-22 LAB — CBC WITH DIFFERENTIAL/PLATELET
Basophils Absolute: 0 10*3/uL (ref 0.0–0.1)
Basophils Relative: 0.5 % (ref 0.0–3.0)
EOS ABS: 0.1 10*3/uL (ref 0.0–0.7)
Eosinophils Relative: 1 % (ref 0.0–5.0)
HCT: 45.9 % (ref 36.0–46.0)
HEMOGLOBIN: 15 g/dL (ref 12.0–15.0)
Lymphocytes Relative: 34.9 % (ref 12.0–46.0)
Lymphs Abs: 2.4 10*3/uL (ref 0.7–4.0)
MCHC: 32.7 g/dL (ref 30.0–36.0)
MCV: 98.2 fl (ref 78.0–100.0)
Monocytes Absolute: 0.5 10*3/uL (ref 0.1–1.0)
Monocytes Relative: 7.8 % (ref 3.0–12.0)
NEUTROS ABS: 3.8 10*3/uL (ref 1.4–7.7)
NEUTROS PCT: 55.8 % (ref 43.0–77.0)
Platelets: 250 10*3/uL (ref 150.0–400.0)
RBC: 4.67 Mil/uL (ref 3.87–5.11)
RDW: 13.5 % (ref 11.5–15.5)
WBC: 6.8 10*3/uL (ref 4.0–10.5)

## 2014-01-22 LAB — URINALYSIS, ROUTINE W REFLEX MICROSCOPIC
BILIRUBIN URINE: NEGATIVE
KETONES UR: NEGATIVE
Leukocytes, UA: NEGATIVE
NITRITE: NEGATIVE
Specific Gravity, Urine: 1.01 (ref 1.000–1.030)
Total Protein, Urine: NEGATIVE
UROBILINOGEN UA: 0.2 (ref 0.0–1.0)
Urine Glucose: NEGATIVE
pH: 5.5 (ref 5.0–8.0)

## 2014-01-22 LAB — HEPATIC FUNCTION PANEL
ALK PHOS: 52 U/L (ref 39–117)
ALT: 19 U/L (ref 0–35)
AST: 26 U/L (ref 0–37)
Albumin: 3.9 g/dL (ref 3.5–5.2)
BILIRUBIN DIRECT: 0.1 mg/dL (ref 0.0–0.3)
BILIRUBIN TOTAL: 0.8 mg/dL (ref 0.2–1.2)
TOTAL PROTEIN: 7.2 g/dL (ref 6.0–8.3)

## 2014-01-22 LAB — H. PYLORI ANTIBODY, IGG: H Pylori IgG: NEGATIVE

## 2014-01-22 LAB — LIPASE: Lipase: 26 U/L (ref 11.0–59.0)

## 2014-01-22 MED ORDER — OMEPRAZOLE 20 MG PO CPDR: 20.0000 mg | DELAYED_RELEASE_CAPSULE | Freq: Every day | ORAL | Status: DC

## 2014-01-22 NOTE — Progress Notes (Signed)
Pre visit review using our clinic review tool, if applicable. No additional management support is needed unless otherwise documented below in the visit note. 

## 2014-01-22 NOTE — Progress Notes (Signed)
Subjective:    Patient ID: Paula Parker, female    DOB: 08-23-1960, 53 y.o.   MRN: [id number]  HPI  Here with c/o episode 4 days ago of severe cramp assoc with BM, improved with BM, but since then also 4 days onset RLQ pain, dull, no radiation, mostly mild with small flares to mod, intermittent but present > 50% time, not aggrevated by eating except for epigastric fullness, nothi ng seems to make better.  Has had some increased harder stools past few days, has BM daily, yesterday seemed fine, but this am kind of harder stool coming out, but no cramping. Had some nausea with initial cramping, none since. No vomiting, No fever, But does feel bloated, right abd > left abd.  Stools have been darker, sometimes even diarrheal as well as above, no BRB. No nsaid use recently, last took for 2 days assoc with dental pain approx 2 wks ago. ETOH - only drank 2 tall beers las PM b/c was off work today (drinks only the night before off work next day); usually ends up drinking 1/2 case per wk.  Denies worsening reflux,  Dysphagia - not using PPI. No otc iron use or pepto bismol, or collard greens. No anticoag use.  Smoke 1 ppd .  Did not have colonsocpy last yr, due to bad weather, not re-scheduled Past Medical History  Diagnosis Date  . Alcoholic fatty liver   . GLAUCOMA, BORDERLINE   . HYPERLIPIDEMIA   . HYPERTENSION   . PALPITATIONS, OCCASIONAL   . SMOKER    Past Surgical History  Procedure Laterality Date  . Cervix lesion destruction  1980'S    reports that she has been smoking Cigarettes.  She has been smoking about 0.00 packs per day. She has never used smokeless tobacco. She reports that she does not drink alcohol or use illicit drugs. family history includes ALS in her mother; Cancer in her sister; Coronary artery disease in her other; Diabetes in her maternal grandfather; Early death in her sister; Heart disease in her sister. There is no history of Stroke, Kidney disease, Hypertension, or  Hyperlipidemia. Allergies  Allergen Reactions  . Doxycycline Other (See Comments)    Throat swelling  . Benzonatate Other (See Comments)    unknown  . Sulfonamide Derivatives Other (See Comments)    unknown  . Penicillins Rash   No current outpatient prescriptions on file prior to visit.   No current facility-administered medications on file prior to visit.   Review of Systems  Constitutional: Negative for unusual diaphoresis or other sweats  HENT: Negative for ringing in ear Eyes: Negative for double vision or worsening visual disturbance.  Respiratory: Negative for choking and stridor.   Gastrointestinal: Negative for vomiting or other signifcant bowel change Genitourinary: Negative for hematuria or decreased urine volume.  Musculoskeletal: Negative for other MSK pain or swelling Skin: Negative for color change and worsening wound.  Neurological: Negative for tremors and numbness other than noted  Psychiatric/Behavioral: Negative for decreased concentration or agitation other than above       Objective:   Physical Exam BP 140/100  Pulse 88  Temp(Src) 98.5 F (36.9 C) (Oral)  Ht 5' (1.524 m)  Wt 129 lb 8 oz (58.741 kg)  BMI 25.29 kg/m2  SpO2 98% VS noted,  Constitutional: Pt appears well-developed, well-nourished.  HENT: Head: NCAT.  Right Ear: External ear normal.  Left Ear: External ear normal.  Eyes: . Pupils are equal, round, and reactive to light. Conjunctivae and  EOM are normal Neck: Normal range of motion. Neck supple.  Cardiovascular: Normal rate and regular rhythm.   Pulmonary/Chest: Effort normal and breath sounds normal.  Abd:  Soft, NT, ND, + BS - benign appearing Neurological: Pt is alert. Not confused , motor grossly intact Skin: Skin is warm. No rash Psychiatric: Pt behavior is normal. No agitation.  Wt Readings from Last 3 Encounters:  01/22/14 129 lb 8 oz (58.741 kg)  07/19/13 127 lb 6 oz (57.777 kg)  04/27/13 122 lb 4 oz (55.452 kg)        Assessment & Plan:

## 2014-01-22 NOTE — Patient Instructions (Signed)
Please take all new medication as prescribed - the prilosec 20 mg per day  Please continue all other medications as before, and refills have been done if requested.  Please have the pharmacy call with any other refills you may need.  Please keep your appointments with your specialists as you may have planned  You will be contacted regarding the referral for: Gastroenterology  Please go to the LAB in the Basement (turn left off the elevator) for the tests to be done today  You will be contacted by phone if any changes need to be made immediately.  Otherwise, you will receive a letter about your results with an explanation, but please check with MyChart first.  Please remember to sign up for MyChart if you have not done so, as this will be important to you in the future with finding out test results, communicating by private email, and scheduling acute appointments online when needed.  Please return in 6 months, or sooner if needed

## 2014-01-23 ENCOUNTER — Encounter: Payer: Self-pay | Admitting: Physician Assistant

## 2014-01-25 ENCOUNTER — Encounter: Payer: Self-pay | Admitting: *Deleted

## 2014-01-31 ENCOUNTER — Encounter: Payer: Self-pay | Admitting: Physician Assistant

## 2014-01-31 ENCOUNTER — Ambulatory Visit (INDEPENDENT_AMBULATORY_CARE_PROVIDER_SITE_OTHER): Payer: 59 | Admitting: Physician Assistant

## 2014-01-31 VITALS — BP 148/96 | HR 76 | Ht 60.0 in | Wt 130.2 lb

## 2014-01-31 DIAGNOSIS — R109 Unspecified abdominal pain: Secondary | ICD-10-CM

## 2014-01-31 DIAGNOSIS — R1013 Epigastric pain: Secondary | ICD-10-CM

## 2014-01-31 DIAGNOSIS — R194 Change in bowel habit: Secondary | ICD-10-CM

## 2014-01-31 MED ORDER — BENEFIBER PO POWD: ORAL | Status: DC

## 2014-01-31 MED ORDER — POLYETHYLENE GLYCOL 3350 17 GM/SCOOP PO POWD: 1.0000 | Freq: Once | ORAL | Status: DC

## 2014-01-31 MED ORDER — OMEPRAZOLE 20 MG PO CPDR: DELAYED_RELEASE_CAPSULE | ORAL | Status: DC

## 2014-01-31 NOTE — Progress Notes (Signed)
Patient ID: Paula Parker, female   DOB: 06-03-1960, 53 y.o.   MRN: [id number]    HPI: [name] is a 53 year old white female referred by Dr. Cathlean Cower for evaluation due to epigastric pain, lower abdominal pain, and a change in bowel habits.  . She reports that for the past 4-6 weeks, she has been having lower abdominal pain that is crampy in nature and comes and goes throughout the day. It is typically brought on by ingestion of food and is alleviated with passage of gas or a bowel movement. Off until a month and a half ago, she would have a normal formed bowel movement every day to every other day. Over the past 6 weeks she has had days of formed stools alternating with days of nugget-like stools alternating with days of loose stools. She feels very gassy and bloated. She has eliminated dairy products because she states anything dairy causes her to have loose stools. She has had no bright red blood per rectum and no melena. On the days of formed stools, her stools have been pencil thin and broken into pieces. She gets a lot of cramping prior to defecation relieved with defecation. She's had no mucus with her stools. Her appetite as been good and her weight has been stable. There is no known family history of colon cancer, colon polyps, inflammatory bowel disease, or celiac disease.  She also states that for the past several months she has had epigastric pain that is typically worse on an empty stomach and alleviated with ingestion of food is discomfort sometimes wakes her up at night. She had an upper endoscopy in 2004 that revealed reflux esophagitis and a hiatal hernia she had been on Nexium for about 2 months but then states she started to feel tightness in her chest and discontinued its use. She has no dysphagia but notes that she does have some nausea. She has not vomited. Her epigastric pain does not radiate.   Past Medical History  Diagnosis Date  . Alcoholic fatty liver   . GLAUCOMA,  BORDERLINE   . HYPERLIPIDEMIA   . HYPERTENSION   . PALPITATIONS, OCCASIONAL   . SMOKER   . Hiatal hernia   . GERD (gastroesophageal reflux disease)   . Pseudomembranous colitis     Past Surgical History  Procedure Laterality Date  . Cervix lesion destruction  1980'S   Family History  Problem Relation Age of Onset  . Coronary artery disease Other     female first degree <50  . Ovarian cancer Sister 25  . Heart disease Sister   . Early death Sister   . Diabetes Maternal Grandfather   . ALS Mother   . Stroke Neg Hx   . Kidney disease Neg Hx   . Hypertension Neg Hx   . Hyperlipidemia Neg Hx    History  Substance Use Topics  . Smoking status: Current Every Day Smoker    Types: Cigarettes  . Smokeless tobacco: Never Used  . Alcohol Use: No     Comment: OCC   Current Outpatient Prescriptions  Medication Sig Dispense Refill  . omeprazole (PRILOSEC) 20 MG capsule Take 1 capsule (20 mg total) by mouth daily.  30 capsule  11   No current facility-administered medications for this visit.   Allergies  Allergen Reactions  . Doxycycline Other (See Comments)    Throat swelling  . Benzonatate Other (See Comments)    unknown  . Sulfonamide Derivatives Other (See Comments)  unknown  . Penicillins Rash     Review of Systems: Gen: Denies any fever, chills, sweats, anorexia, fatigue, weakness, malaise, weight loss, and sleep disorder CV: Denies chest pain, angina, palpitations, syncope, orthopnea, PND, peripheral edema, and claudication. Resp: Denies dyspnea at rest, dyspnea with exercise, cough, sputum, wheezing, coughing up blood, and pleurisy. GI: Denies vomiting blood, jaundice, and fecal incontinence.   Denies dysphagia or odynophagia. GU : Denies urinary burning, blood in urine, urinary frequency, urinary hesitancy, nocturnal urination, and urinary incontinence. MS: Denies joint pain, limitation of movement, and swelling, stiffness, low back pain, extremity pain. Denies  muscle weakness, cramps, atrophy.  Derm: Denies rash, itching, dry skin, hives, moles, warts, or unhealing ulcers.  Psych: Denies depression, anxiety, memory loss, suicidal ideation, hallucinations, paranoia, and confusion. Heme: Denies bruising, bleeding, and enlarged lymph nodes. Neuro:  Denies any headaches, dizziness, paresthesias. Endo:  Denies any problems with DM, thyroid, adrenal function, LMP 10 years ago.   LAB RESULTS: Temp profile on 01/22/2014 showed an alkaline phosphatase of 52 lipase 26, AST 26, ALT 19, direct bili 0.1, total bili 0.8. CBC white blood count 6.8, hemoglobin 15.0, hematocrit 45.9, platelets 250,000.  Prior Endoscopies:  EGD on 01/29/2003 revealed reflux esophagitis and a hiatal hernia.  Physical Exam: BP 148/96  Pulse 76  Ht 5' (1.524 m)  Wt 130 lb 3.2 oz (59.058 kg)  BMI 25.43 kg/m2 Constitutional: Pleasant,well-developed, well nourished female in no acute distress. HEENT: Normocephalic and atraumatic. Conjunctivae are normal. No scleral icterus. Neck supple. No thyromegaly Cardiovascular: Normal rate, regular rhythm.  Pulmonary/chest: Effort normal and breath sounds normal. No wheezing, rales or rhonchi. Abdominal: Soft, nondistended, mild to moderate diffuse tenderness with no rebound or guarding,. Bowel sounds active throughout. There are no masses palpable. No hepatomegaly. Extremities: no edema Lymphadenopathy: No cervical adenopathy noted. Neurological: Alert and oriented to person place and time. Skin: Skin is warm and dry. No rashes noted. Psychiatric: Normal mood and affect. Behavior is normal.  ASSESSMENT AND PLAN: #1 epigastric pain. The patient has a known history of a hiatal hernia and reflux and has been off meds for several year. By her own admission her diet has not been the best due to her schedule. An antireflux regimen has been reviewed with her she will use Prilosec OTC 20 mg one by mouth every morning 30 minutes prior to breakfast.  She will be scheduled for an EGD to assess for esophagitis, gastritis, or ulcer.The risks, benefits, and alternatives to endoscopy with possible biopsy and possible dilation were discussed with the patient and they consent to proceed.   #2. Change in bowel habits and lower abdominal pain. Her symptoms may be due to some colon spasm or irritable bowel syndrome however, with her change in bowel habits she will be scheduled for a colonoscopy to evaluate for polyps, neoplasia, or possible inflammatory process.The risks, benefits, and alternatives to colonoscopy with possible biopsy and possible polypectomy were discussed with the patient and they consent to proceed. In the meantime, she will use Benefiber 1 heaping tablespoon daily in juice. She has been advised to increase fiber and fluid in her diet.  She will follow up 2-3 weeks after her endoscopic evaluations to assess response to the above treatment regimens.    Isair Inabinet, Vita Barley PA-C 01/31/2014, 10:43 AM

## 2014-01-31 NOTE — Progress Notes (Signed)
Agree w/ Ms. Hvozdovic's note and mangement.

## 2014-01-31 NOTE — Patient Instructions (Signed)
You have been scheduled for an endoscopy and colonoscopy. Please follow the written instructions given to you at your visit today. Please pick up your prep at the pharmacy within the next 1-3 days. If you use inhalers (even only as needed), please bring them with you on the day of your procedure. Your physician has requested that you go to www.startemmi.com and enter the access code given to you at your visit today. This web site gives a general overview about your procedure. However, you should still follow specific instructions given to you by our office regarding your preparation for the procedure.  Please continue Omeprazole 20 mg OTC by mouth 30 minutes before breakfast. Please purchase Benefiber OTC and take 1 tablespoon in water or juice daily.  Cecille Rubin PA-C suggest that you schedule a follow up appointment 2 week after your procedure with her or Dr. Carlean Purl, that appointment will be schedule in the [facility] after the procedure.  Gastroesophageal Reflux Disease, Adult Gastroesophageal reflux disease (GERD) happens when acid from your stomach flows up into the esophagus. When acid comes in contact with the esophagus, the acid causes soreness (inflammation) in the esophagus. Over time, GERD may create small holes (ulcers) in the lining of the esophagus. CAUSES   Increased body weight. This puts pressure on the stomach, making acid rise from the stomach into the esophagus.  Smoking. This increases acid production in the stomach.  Drinking alcohol. This causes decreased pressure in the lower esophageal sphincter (valve or ring of muscle between the esophagus and stomach), allowing acid from the stomach into the esophagus.  Late evening meals and a full stomach. This increases pressure and acid production in the stomach.  A malformed lower esophageal sphincter. Sometimes, no cause is found. SYMPTOMS   Burning pain in the lower part of the mid-chest behind the breastbone and in the mid-stomach  area. This may occur twice a week or more often.  Trouble swallowing.  Sore throat.  Dry cough.  Asthma-like symptoms including chest tightness, shortness of breath, or wheezing. DIAGNOSIS  Your caregiver may be able to diagnose GERD based on your symptoms. In some cases, X-rays and other tests may be done to check for complications or to check the condition of your stomach and esophagus. TREATMENT  Your caregiver may recommend over-the-counter or prescription medicines to help decrease acid production. Ask your caregiver before starting or adding any new medicines.  HOME CARE INSTRUCTIONS   Change the factors that you can control. Ask your caregiver for guidance concerning weight loss, quitting smoking, and alcohol consumption.  Avoid foods and drinks that make your symptoms worse, such as:  Caffeine or alcoholic drinks.  Chocolate.  Peppermint or mint flavorings.  Garlic and onions.  Spicy foods.  Citrus fruits, such as oranges, lemons, or limes.  Tomato-based foods such as sauce, chili, salsa, and pizza.  Fried and fatty foods.  Avoid lying down for the 3 hours prior to your bedtime or prior to taking a nap.  Eat small, frequent meals instead of large meals.  Wear loose-fitting clothing. Do not wear anything tight around your waist that causes pressure on your stomach.  Raise the head of your bed 6 to 8 inches with wood blocks to help you sleep. Extra pillows will not help.  Only take over-the-counter or prescription medicines for pain, discomfort, or fever as directed by your caregiver.  Do not take aspirin, ibuprofen, or other nonsteroidal anti-inflammatory drugs (NSAIDs). SEEK IMMEDIATE MEDICAL CARE IF:   You have pain  in your arms, neck, jaw, teeth, or back.  Your pain increases or changes in intensity or duration.  You develop nausea, vomiting, or sweating (diaphoresis).  You develop shortness of breath, or you faint.  Your vomit is green, yellow,  black, or looks like coffee grounds or blood.  Your stool is red, bloody, or black. These symptoms could be signs of other problems, such as heart disease, gastric bleeding, or esophageal bleeding. MAKE SURE YOU:   Understand these instructions.  Will watch your condition.  Will get help right away if you are not doing well or get worse. Document Released: 12/30/2004 Document Revised: 06/14/2011 Document Reviewed: 10/09/2010 [facility] Patient Information 2015 [place], [place]. This information is not intended to replace advice given to you by your health care provider. Make sure you discuss any questions you have with your health care provider.

## 2014-02-04 ENCOUNTER — Encounter: Payer: Self-pay | Admitting: Physician Assistant

## 2014-02-24 ENCOUNTER — Encounter (HOSPITAL_COMMUNITY): Payer: Self-pay

## 2014-02-24 ENCOUNTER — Emergency Department (HOSPITAL_COMMUNITY)
Admission: EM | Admit: 2014-02-24 | Discharge: 2014-02-24 | Disposition: A | Discharge status: Home / Self Care | Payer: 59 | Source: Home / Self Care | Attending: Family Medicine | Admitting: Family Medicine

## 2014-02-24 DIAGNOSIS — J069 Acute upper respiratory infection, unspecified: Secondary | ICD-10-CM

## 2014-02-24 MED ORDER — AZITHROMYCIN 250 MG PO TABS: ORAL_TABLET | ORAL | Status: DC

## 2014-02-24 MED ORDER — ALBUTEROL SULFATE HFA 108 (90 BASE) MCG/ACT IN AERS: 2.0000 | INHALATION_SPRAY | RESPIRATORY_TRACT | Status: DC | PRN

## 2014-02-24 MED ORDER — PREDNISONE 50 MG PO TABS: 50.0000 mg | ORAL_TABLET | Freq: Every day | ORAL | Status: DC

## 2014-02-24 NOTE — ED Provider Notes (Signed)
CSN: [id number]     Arrival date & time 02/24/14  1421 History   First MD Initiated Contact with Patient 02/24/14 1434     Chief Complaint  Patient presents with  . Cough   (Consider location/radiation/quality/duration/timing/severity/associated sxs/prior Treatment) HPI             53 year old female smoker presents complaining of cough, congestion, shortness of breath, sinus pressure. Her symptoms began 4 days ago and have gotten progressively worse. She has been taking over-the-counter medication without relief. She continues to smoke at least one pack of cigarettes daily. No fever, chills, NVD, chest pain. No recent travel or sick contacts. No leg swelling. No history of DVT or PE.  Past Medical History  Diagnosis Date  . Alcoholic fatty liver   . GLAUCOMA, BORDERLINE   . HYPERLIPIDEMIA   . HYPERTENSION   . PALPITATIONS, OCCASIONAL   . SMOKER   . Hiatal hernia   . GERD (gastroesophageal reflux disease)   . Pseudomembranous colitis    Past Surgical History  Procedure Laterality Date  . Cervix lesion destruction  1980'S   Family History  Problem Relation Age of Onset  . Coronary artery disease Other     female first degree <50  . Ovarian cancer Sister 15  . Heart disease Sister   . Early death Sister   . Diabetes Maternal Grandfather   . ALS Mother   . Stroke Neg Hx   . Kidney disease Neg Hx   . Hypertension Neg Hx   . Hyperlipidemia Neg Hx    History  Substance Use Topics  . Smoking status: Current Every Day Smoker    Types: Cigarettes  . Smokeless tobacco: Never Used  . Alcohol Use: No     Comment: OCC   OB History    Gravida Para Term Preterm AB TAB SAB Ectopic Multiple Living   3 1 1  2  2   1      Review of Systems  Constitutional: Negative for fever and chills.  HENT: Positive for sinus pressure and sore throat. Negative for congestion.   Respiratory: Positive for cough, chest tightness and shortness of breath. Negative for wheezing.   Cardiovascular:  Negative for chest pain.  Gastrointestinal: Negative for nausea, vomiting and diarrhea.  All other systems reviewed and are negative.   Allergies  Doxycycline; Benzonatate; Penicillins; and Sulfonamide derivatives  Home Medications   Prior to Admission medications   Medication Sig Start Date End Date Taking? Authorizing Provider  albuterol (PROVENTIL HFA;VENTOLIN HFA) 108 (90 BASE) MCG/ACT inhaler Inhale 2 puffs into the lungs every 4 (four) hours as needed for wheezing. 02/24/14   Liam Graham, PA-C  azithromycin (ZITHROMAX Z-PAK) 250 MG tablet Use as directed 02/24/14   Liam Graham, PA-C  omeprazole (PRILOSEC) 20 MG capsule Take 1 capsule by mouth every morning 30 minutes before breakfast 01/31/14   Lori P Hvozdovic, PA-C  polyethylene glycol powder (GLYCOLAX/MIRALAX) powder Take 255 g by mouth once. 01/31/14   Lori P Hvozdovic, PA-C  predniSONE (DELTASONE) 50 MG tablet Take 1 tablet (50 mg total) by mouth daily with breakfast. 02/24/14   Liam Graham, PA-C  Wheat Dextrin (BENEFIBER) POWD Take 1 tablespoon by mouth every day in water or juice 01/31/14   Lori P Hvozdovic, PA-C   BP 149/90 mmHg  Pulse 113  Temp(Src) 98 F (36.7 C) (Oral)  Resp 16  SpO2 99% Physical Exam  Constitutional: She is oriented to person, place, and time. Vital signs  are normal. She appears well-developed and well-nourished. No distress.  HENT:  Head: Normocephalic and atraumatic.  Right Ear: External ear normal.  Left Ear: External ear normal.  Nose: Right sinus exhibits maxillary sinus tenderness. Right sinus exhibits no frontal sinus tenderness. Left sinus exhibits no maxillary sinus tenderness and no frontal sinus tenderness.  Mouth/Throat: Oropharynx is clear and moist. No oropharyngeal exudate.  Eyes: Conjunctivae are normal. Right eye exhibits no discharge. Left eye exhibits no discharge.  Neck: Normal range of motion.  Cardiovascular: Normal rate, regular rhythm, normal heart sounds and  intact distal pulses.   Pulmonary/Chest: Effort normal and breath sounds normal. No respiratory distress.  Lymphadenopathy:    She has no cervical adenopathy.  Neurological: She is alert and oriented to person, place, and time. She has normal strength. Coordination normal.  Skin: Skin is warm and dry. No rash noted. She is not diaphoretic.  Psychiatric: She has a normal mood and affect. Judgment normal.  Nursing note and vitals reviewed.   ED Course  Procedures (including critical care time) Labs Review Labs Reviewed - No data to display  Imaging Review No results found.   MDM   1. URI (upper respiratory infection)    Given long smoking history, will treat as a COPD exacerbation with azithromycin, prednisone, and albuterol. Follow-up if no improvement in a few days.   Meds ordered this encounter  Medications  . azithromycin (ZITHROMAX Z-PAK) 250 MG tablet    Sig: Use as directed    Dispense:  6 each    Refill:  0    Order Specific Question:  Supervising Provider    Answer:  Lynne Leader, [place]  . predniSONE (DELTASONE) 50 MG tablet    Sig: Take 1 tablet (50 mg total) by mouth daily with breakfast.    Dispense:  5 tablet    Refill:  0    Order Specific Question:  Supervising Provider    Answer:  Lynne Leader, [place]  . albuterol (PROVENTIL HFA;VENTOLIN HFA) 108 (90 BASE) MCG/ACT inhaler    Sig: Inhale 2 puffs into the lungs every 4 (four) hours as needed for wheezing.    Dispense:  1 Inhaler    Refill:  0    Order Specific Question:  Supervising Provider    Answer:  Lynne Leader, [place]       Liam Graham, PA-C 02/24/14 1447

## 2014-02-24 NOTE — Discharge Instructions (Signed)
Upper Respiratory Infection, Adult An upper respiratory infection (URI) is also sometimes known as the common cold. The upper respiratory tract includes the nose, sinuses, throat, trachea, and bronchi. Bronchi are the airways leading to the lungs. Most people improve within 1 week, but symptoms can last up to 2 weeks. A residual cough may last even longer.  CAUSES Many different viruses can infect the tissues lining the upper respiratory tract. The tissues become irritated and inflamed and often become very moist. Mucus production is also common. A cold is contagious. You can easily spread the virus to others by oral contact. This includes kissing, sharing a glass, coughing, or sneezing. Touching your mouth or nose and then touching a surface, which is then touched by another person, can also spread the virus. SYMPTOMS  Symptoms typically develop 1 to 3 days after you come in contact with a cold virus. Symptoms vary from person to person. They may include:  Runny nose.  Sneezing.  Nasal congestion.  Sinus irritation.  Sore throat.  Loss of voice (laryngitis).  Cough.  Fatigue.  Muscle aches.  Loss of appetite.  Headache.  Low-grade fever. DIAGNOSIS  You might diagnose your own cold based on familiar symptoms, since most people get a cold 2 to 3 times a year. Your caregiver can confirm this based on your exam. Most importantly, your caregiver can check that your symptoms are not due to another disease such as strep throat, sinusitis, pneumonia, asthma, or epiglottitis. Blood tests, throat tests, and X-rays are not necessary to diagnose a common cold, but they may sometimes be helpful in excluding other more serious diseases. Your caregiver will decide if any further tests are required. RISKS AND COMPLICATIONS  You may be at risk for a more severe case of the common cold if you smoke cigarettes, have chronic heart disease (such as heart failure) or lung disease (such as asthma), or if  you have a weakened immune system. The very young and very old are also at risk for more serious infections. Bacterial sinusitis, middle ear infections, and bacterial pneumonia can complicate the common cold. The common cold can worsen asthma and chronic obstructive pulmonary disease (COPD). Sometimes, these complications can require emergency medical care and may be life-threatening. PREVENTION  The best way to protect against getting a cold is to practice good hygiene. Avoid oral or hand contact with people with cold symptoms. Wash your hands often if contact occurs. There is no clear evidence that vitamin C, vitamin E, echinacea, or exercise reduces the chance of developing a cold. However, it is always recommended to get plenty of rest and practice good nutrition. TREATMENT  Treatment is directed at relieving symptoms. There is no cure. Antibiotics are not effective, because the infection is caused by a virus, not by bacteria. Treatment may include:  Increased fluid intake. Sports drinks offer valuable electrolytes, sugars, and fluids.  Breathing heated mist or steam (vaporizer or shower).  Eating chicken soup or other clear broths, and maintaining good nutrition.  Getting plenty of rest.  Using gargles or lozenges for comfort.  Controlling fevers with ibuprofen or acetaminophen as directed by your caregiver.  Increasing usage of your inhaler if you have asthma. Zinc gel and zinc lozenges, taken in the first 24 hours of the common cold, can shorten the duration and lessen the severity of symptoms. Pain medicines may help with fever, muscle aches, and throat pain. A variety of non-prescription medicines are available to treat congestion and runny nose. Your caregiver   can make recommendations and may suggest nasal or lung inhalers for other symptoms.  HOME CARE INSTRUCTIONS   Only take over-the-counter or prescription medicines for pain, discomfort, or fever as directed by your  caregiver.  Use a warm mist humidifier or inhale steam from a shower to increase air moisture. This may keep secretions moist and make it easier to breathe.  Drink enough water and fluids to keep your urine clear or pale yellow.  Rest as needed.  Return to work when your temperature has returned to normal or as your caregiver advises. You may need to stay home longer to avoid infecting others. You can also use a face mask and careful hand washing to prevent spread of the virus. SEEK MEDICAL CARE IF:   After the first few days, you feel you are getting worse rather than better.  You need your caregiver's advice about medicines to control symptoms.  You develop chills, worsening shortness of breath, or brown or red sputum. These may be signs of pneumonia.  You develop yellow or brown nasal discharge or pain in the face, especially when you bend forward. These may be signs of sinusitis.  You develop a fever, swollen neck glands, pain with swallowing, or white areas in the back of your throat. These may be signs of strep throat. SEEK IMMEDIATE MEDICAL CARE IF:   You have a fever.  You develop severe or persistent headache, ear pain, sinus pain, or chest pain.  You develop wheezing, a prolonged cough, cough up blood, or have a change in your usual mucus (if you have chronic lung disease).  You develop sore muscles or a stiff neck. Document Released: 09/15/2000 Document Revised: 06/14/2011 Document Reviewed: 06/27/2013 ExitCare Patient Information 2015 [employer]. This information is not intended to replace advice given to you by your health care provider. Make sure you discuss any questions you have with your health care provider.  

## 2014-02-24 NOTE — ED Notes (Signed)
C/o "my bronchitis is coming back". C/o she has had a cough x 3 days. Tried to use Rx left over from 2 yr ago, discarded. NAD

## 2014-02-25 ENCOUNTER — Telehealth: Payer: Self-pay | Admitting: Internal Medicine

## 2014-02-25 ENCOUNTER — Telehealth: Payer: Self-pay | Admitting: *Deleted

## 2014-02-25 MED ORDER — PROMETHAZINE-DM 6.25-15 MG/5ML PO SYRP: 5.0000 mL | ORAL_SOLUTION | Freq: Four times a day (QID) | ORAL | Status: DC | PRN

## 2014-02-25 NOTE — Telephone Encounter (Signed)
done

## 2014-02-25 NOTE — Telephone Encounter (Signed)
Notified pt md sent cough syrup to pharmacy...Paula Parker

## 2014-02-25 NOTE — Telephone Encounter (Signed)
[facility] Triage Call Report Triage Record Num: [id number] Operator: Merrilee Seashore Patient Name: Denika Krone Call Date & Time: 02/23/2014 4:57:36PM Patient Phone: [phone] PCP: Cathlean Cower Patient Gender: Female PCP Fax : Patient DOB: 07-08-1960 Practice Name: [facility] Reason for Call: Caller: Marily/Patient; PCP: Cathlean Cower (Adults only); CB#: [phone]; Call regarding Cough/Congestion; Onset 02/20/14. coughing up clear green mucus. Afebrile. Currently talking delsym, pepperment, with relief. Protocol(s) Used: Cough - Adult Recommended Outcome per Protocol: See Provider within 24 hours Reason for Outcome: Productive cough with colored sputum (other than clear or white sputum) Care Advice: Limit or avoid exposure to irritants and allergens (e.g. air pollution, smoke/smoking, chemicals, dust, pollen, pet dander, etc.) ~ ~ If you can, stop smoking now and avoid all secondhand smoke. Drink more fluids -- water, low-sugar juices, tea and warm soup, especially chicken broth, are options. Avoid caffeinated or alcoholic beverages because they can increase the chance of dehydration. ~ Coughing up mucus or phlegm helps to get rid of an infection. A productive cough should not be stopped. A cough medicine with guaifenesin (Robitussin, Mucinex) can help loosen the mucus. Cough medicine with dextromethorphan (DM) should be avoided. Drinking lots of fluids can help loosen the mucus too, especially warm fluids. ~ Total water intake includes drinking water, water in beverages, and water contained in food. Fluids make up about 80% of the body's total hydration need. Individual fluid requirement to maintain hydration vary based on physical activity, environmental factors and illness. Limit fluids that contain sugar, caffeine, or alcohol. Urine will be very light yellow color when you drink enough fluids. ~ 11/

## 2014-02-25 NOTE — Telephone Encounter (Signed)
Patient was seen in ED for URI. She states she has been coughing so hard that she has vomited and her chest and back are hurting. She would like to know if we can call something in for her without her being seen. Pt uses [facility]. CB# [phone]

## 2014-03-05 ENCOUNTER — Encounter: Payer: Self-pay | Admitting: Internal Medicine

## 2014-03-05 ENCOUNTER — Ambulatory Visit (INDEPENDENT_AMBULATORY_CARE_PROVIDER_SITE_OTHER)
Admission: RE | Admit: 2014-03-05 | Discharge: 2014-03-05 | Disposition: A | Discharge status: Home / Self Care | Payer: 59 | Source: Ambulatory Visit | Attending: Internal Medicine | Admitting: Internal Medicine

## 2014-03-05 ENCOUNTER — Ambulatory Visit (INDEPENDENT_AMBULATORY_CARE_PROVIDER_SITE_OTHER): Payer: 59 | Admitting: Internal Medicine

## 2014-03-05 VITALS — BP 130/86 | HR 93 | Temp 98.3°F | Resp 16 | Ht 60.0 in | Wt 130.0 lb

## 2014-03-05 DIAGNOSIS — R059 Cough, unspecified: Secondary | ICD-10-CM

## 2014-03-05 DIAGNOSIS — J069 Acute upper respiratory infection, unspecified: Secondary | ICD-10-CM

## 2014-03-05 DIAGNOSIS — R05 Cough: Secondary | ICD-10-CM

## 2014-03-05 MED ORDER — HYDROCOD POLST-CPM POLST ER 10-8 MG PO CP12: 1.0000 | ORAL_CAPSULE | Freq: Two times a day (BID) | ORAL | Status: DC | PRN

## 2014-03-05 NOTE — Patient Instructions (Signed)
Cough, Adult  A cough is a reflex that helps clear your throat and airways. It can help heal the body or may be a reaction to an irritated airway. A cough may only last 2 or 3 weeks (acute) or may last more than 8 weeks (chronic).  CAUSES Acute cough:  Viral or bacterial infections. Chronic cough:  Infections.  Allergies.  Asthma.  Post-nasal drip.  Smoking.  Heartburn or acid reflux.  Some medicines.  Chronic lung problems (COPD).  Cancer. SYMPTOMS   Cough.  Fever.  Chest pain.  Increased breathing rate.  High-pitched whistling sound when breathing (wheezing).  Colored mucus that you cough up (sputum). TREATMENT   A bacterial cough may be treated with antibiotic medicine.  A viral cough must run its course and will not respond to antibiotics.  Your caregiver may recommend other treatments if you have a chronic cough. HOME CARE INSTRUCTIONS   Only take over-the-counter or prescription medicines for pain, discomfort, or fever as directed by your caregiver. Use cough suppressants only as directed by your caregiver.  Use a cold steam vaporizer or humidifier in your bedroom or home to help loosen secretions.  Sleep in a semi-upright position if your cough is worse at night.  Rest as needed.  Stop smoking if you smoke. SEEK IMMEDIATE MEDICAL CARE IF:   You have pus in your sputum.  Your cough starts to worsen.  You cannot control your cough with suppressants and are losing sleep.  You begin coughing up blood.  You have difficulty breathing.  You develop pain which is getting worse or is uncontrolled with medicine.  You have a fever. MAKE SURE YOU:   Understand these instructions.  Will watch your condition.  Will get help right away if you are not doing well or get worse. Document Released: 09/18/2010 Document Revised: 06/14/2011 Document Reviewed: 09/18/2010 ExitCare Patient Information 2015 [employer]. This information is not intended  to replace advice given to you by your health care provider. Make sure you discuss any questions you have with your health care provider.  

## 2014-03-05 NOTE — Progress Notes (Signed)
   Subjective:    Patient ID: Paula Parker, female    DOB: 18-Mar-1961, 53 y.o.   MRN: [id number]  Cough This is a new problem. The current episode started 1 to 4 weeks ago. The problem has been unchanged. The problem occurs every few hours. The cough is productive of sputum. Pertinent negatives include no chest pain, chills, ear congestion, ear pain, fever, headaches, heartburn, hemoptysis, myalgias, nasal congestion, postnasal drip, rash, rhinorrhea, sore throat, shortness of breath, sweats, weight loss or wheezing. Risk factors for lung disease include smoking/tobacco exposure. She has tried a beta-agonist inhaler and prescription cough suppressant for the symptoms. The treatment provided mild relief. There is no history of asthma, bronchiectasis, bronchitis, COPD, emphysema, environmental allergies or pneumonia.      Review of Systems  Constitutional: Negative for fever, chills and weight loss.  HENT: Negative for ear pain, postnasal drip, rhinorrhea and sore throat.   Respiratory: Positive for cough. Negative for hemoptysis, shortness of breath and wheezing.   Cardiovascular: Negative for chest pain.  Gastrointestinal: Negative for heartburn.  Musculoskeletal: Negative for myalgias.  Skin: Negative for rash.  Allergic/Immunologic: Negative for environmental allergies.  Neurological: Negative for headaches.  All other systems reviewed and are negative.      Objective:   Physical Exam  Constitutional: She is oriented to person, place, and time. She appears well-developed and well-nourished.  Non-toxic appearance. She does not have a sickly appearance. She does not appear ill. No distress.  HENT:  Head: Normocephalic and atraumatic.  Mouth/Throat: Oropharynx is clear and moist. No oropharyngeal exudate.  Eyes: Conjunctivae are normal. Right eye exhibits no discharge. Left eye exhibits no discharge. No scleral icterus.  Neck: Normal range of motion. Neck supple. No JVD present. No  tracheal deviation present. No thyromegaly present.  Cardiovascular: Normal rate, regular rhythm, normal heart sounds and intact distal pulses.  Exam reveals no gallop and no friction rub.   No murmur heard. Pulmonary/Chest: Effort normal and breath sounds normal. No accessory muscle usage or stridor. No respiratory distress. She has no decreased breath sounds. She has no wheezes. She has no rhonchi. She has no rales. She exhibits no tenderness.  Abdominal: Soft. Bowel sounds are normal. She exhibits no distension and no mass. There is no tenderness. There is no rebound and no guarding.  Musculoskeletal: Normal range of motion. She exhibits no edema or tenderness.  Lymphadenopathy:    She has no cervical adenopathy.  Neurological: She is oriented to person, place, and time.  Skin: Skin is warm and dry. No rash noted. She is not diaphoretic. No erythema. No pallor.  Psychiatric: She has a normal mood and affect. Her behavior is normal. Judgment and thought content normal.  Vitals reviewed.   Lab Results  Component Value Date   WBC 6.8 01/22/2014   HGB 15.0 01/22/2014   HCT 45.9 01/22/2014   PLT 250.0 01/22/2014   GLUCOSE 89 01/22/2014   CHOL 182 04/17/2013   TRIG 40.0 04/17/2013   HDL 87.90 04/17/2013   LDLCALC 86 04/17/2013   ALT 19 01/22/2014   AST 26 01/22/2014   NA 142 01/22/2014   K 3.9 01/22/2014   CL 108 01/22/2014   CREATININE 0.8 01/22/2014   BUN 13 01/22/2014   CO2 21 01/22/2014   TSH 1.77 04/17/2013        Assessment & Plan:

## 2014-03-05 NOTE — Assessment & Plan Note (Signed)
I think she has a persistent cough s/p a URI Will get a CXR done to look for mass, edema, PNA She will try tussicaps for the cough

## 2014-03-05 NOTE — Assessment & Plan Note (Signed)
This appears to be viral to me so will not offer any more antibiotics She will try tussicaps for the cough

## 2014-03-05 NOTE — Progress Notes (Signed)
Pre visit review using our clinic review tool, if applicable. No additional management support is needed unless otherwise documented below in the visit note. 

## 2014-03-12 ENCOUNTER — Telehealth: Payer: Self-pay | Admitting: Internal Medicine

## 2014-03-12 NOTE — Telephone Encounter (Signed)
No charge. 

## 2014-03-14 ENCOUNTER — Encounter: Payer: Self-pay | Admitting: Internal Medicine

## 2014-03-22 ENCOUNTER — Ambulatory Visit: Payer: Self-pay

## 2014-03-22 ENCOUNTER — Other Ambulatory Visit: Payer: Self-pay | Admitting: Occupational Medicine

## 2014-03-22 DIAGNOSIS — M25511 Pain in right shoulder: Secondary | ICD-10-CM

## 2014-04-11 ENCOUNTER — Telehealth: Payer: Self-pay | Admitting: Internal Medicine

## 2014-04-11 NOTE — Telephone Encounter (Signed)
Pt called and needs 11/23-25 added to her FMLA paperwork, then faxed to employer (number on form) by 04/14/14

## 2014-04-22 ENCOUNTER — Encounter (HOSPITAL_COMMUNITY): Payer: Self-pay | Admitting: Emergency Medicine

## 2014-04-22 ENCOUNTER — Emergency Department (HOSPITAL_COMMUNITY): Payer: 59

## 2014-04-22 ENCOUNTER — Emergency Department (HOSPITAL_COMMUNITY)
Admission: EM | Admit: 2014-04-22 | Discharge: 2014-04-22 | Disposition: A | Discharge status: Home / Self Care | Payer: 59 | Attending: Emergency Medicine | Admitting: Emergency Medicine

## 2014-04-22 DIAGNOSIS — Z880 Allergy status to penicillin: Secondary | ICD-10-CM | POA: Diagnosis not present

## 2014-04-22 DIAGNOSIS — J189 Pneumonia, unspecified organism: Secondary | ICD-10-CM

## 2014-04-22 DIAGNOSIS — Z8719 Personal history of other diseases of the digestive system: Secondary | ICD-10-CM | POA: Insufficient documentation

## 2014-04-22 DIAGNOSIS — J159 Unspecified bacterial pneumonia: Secondary | ICD-10-CM | POA: Insufficient documentation

## 2014-04-22 DIAGNOSIS — Z720 Tobacco use: Secondary | ICD-10-CM | POA: Insufficient documentation

## 2014-04-22 DIAGNOSIS — R05 Cough: Secondary | ICD-10-CM | POA: Diagnosis present

## 2014-04-22 MED ORDER — AZITHROMYCIN 250 MG PO TABS: 250.0000 mg | ORAL_TABLET | Freq: Every day | ORAL | Status: DC

## 2014-04-22 MED ORDER — DEXTROMETHORPHAN POLISTIREX 30 MG/5ML PO LQCR: 10.0000 mL | Freq: Two times a day (BID) | ORAL | Status: DC

## 2014-04-22 NOTE — ED Provider Notes (Signed)
CSN: [id number]     Arrival date & time 04/22/14  1906 History   First MD Initiated Contact with Patient 04/22/14 1939     Chief Complaint  Patient presents with  . Cough   Paula Parker is a 54 y.o. female who is a smoker with a 36-pack-year history and a history of pneumonia who presents emergency department complaining of productive cough for the past 3 weeks. Patient also reports associated postnasal drip, nasal congestion, chest tightness, and left chest pain which is worse with coughing. Patient reports she noticed a fever today with a maximum temperature of 102. Patient reports she took 200 mg ibuprofen at 5:30 today with some relief. The patient denies history of COPD. The patient denies hematemesis, abdominal pain, nausea, vomiting, shortness of breath, wheezing, or sick contacts.  (Consider location/radiation/quality/duration/timing/severity/associated sxs/prior Treatment) HPI  Past Medical History  Diagnosis Date  . Hiatal hernia    Past Surgical History  Procedure Laterality Date  . Ovary surgery      removed precancerous cells with a laser   No family history on file. History  Substance Use Topics  . Smoking status: Current Every Day Smoker -- 1.00 packs/day  . Smokeless tobacco: Not on file  . Alcohol Use: Yes   OB History    No data available     Review of Systems  Constitutional: Positive for fever and chills.  HENT: Positive for rhinorrhea and sinus pressure. Negative for congestion, ear pain, sore throat and trouble swallowing.   Eyes: Negative for pain and visual disturbance.  Respiratory: Positive for cough and chest tightness. Negative for shortness of breath and wheezing.   Cardiovascular: Positive for chest pain. Negative for palpitations and leg swelling.  Gastrointestinal: Negative for nausea, vomiting, abdominal pain and diarrhea.  Genitourinary: Negative for dysuria and hematuria.  Musculoskeletal: Negative for back pain and neck pain.  Skin:  Negative for rash.  Neurological: Negative for weakness, light-headedness and headaches.      Allergies  Doxycycline; Penicillins; Sulfa antibiotics; and Tessalon  Home Medications   Prior to Admission medications   Medication Sig Start Date End Date Taking? Authorizing Provider  azithromycin (ZITHROMAX Z-PAK) 250 MG tablet Take 1 tablet (250 mg total) by mouth daily. 574m PO day 1, then 2560mPO days 2-5 04/22/14   WiVerda Cuminsansie, PA-C  dextromethorphan (DELSYM) 30 MG/5ML liquid Take 10 mLs (60 mg total) by mouth 2 (two) times daily. 04/22/14   WiVerda Cuminsansie, PA-C  Throat Lozenges (LOZENGES MT) Use as directed 1 lozenge in the mouth or throat every hour as needed (coughing).   Yes Historical Provider, MD   BP 120/80 mmHg  Pulse 94  Temp(Src) 99.3 F (37.4 C) (Oral)  Resp 18  SpO2 96% Physical Exam  Constitutional: She appears well-developed and well-nourished. No distress.  HENT:  Head: Normocephalic and atraumatic.  Right Ear: External ear normal.  Left Ear: External ear normal.  Nose: Nose normal.  Mouth/Throat: Oropharynx is clear and moist. No oropharyngeal exudate.  Bilateral tympanic membranes are pearly-gray without erythema or loss of landmarks.  Eyes: Conjunctivae are normal. Pupils are equal, round, and reactive to light. Right eye exhibits no discharge. Left eye exhibits no discharge.  Neck: Normal range of motion. Neck supple.  Cardiovascular: Normal rate, regular rhythm, normal heart sounds and intact distal pulses.  Exam reveals no gallop and no friction rub.   No murmur heard. HR is 92.   Pulmonary/Chest: Effort normal and breath sounds normal. No respiratory  distress. She has no wheezes. She has no rales. She exhibits tenderness.  Patient's lungs are clear to auscultation. Patient sounds slightly diminished in her bilateral lower fields. No wheezes or rhonchi noted.   Abdominal: Soft. There is no tenderness.  Musculoskeletal: She exhibits no  edema.  Lymphadenopathy:    She has no cervical adenopathy.  Neurological: She is alert. Coordination normal.  Skin: Skin is warm and dry. No rash noted. She is not diaphoretic. No erythema. No pallor.  Psychiatric: She has a normal mood and affect. Her behavior is normal.  Nursing note and vitals reviewed.   ED Course  Procedures (including critical care time) Labs Review Labs Reviewed - No data to display  Imaging Review Dg Chest 2 View  04/22/2014   CLINICAL DATA:  Productive cough.  Initial encounter.  EXAM: CHEST  2 VIEW  COMPARISON:  None.  FINDINGS: There are nonspecific patchy opacities in the lung along the left heart border. Right lung is clear. Normal heart size. Hyperaeration. No pneumothorax or pleural effusion. Mild upper thoracic compression deformity.  IMPRESSION: Nonspecific patchy densities at the left base. Follow-up studies with appropriate antibiotic therapy until complete resolution are recommended.   Electronically Signed   By: Maryclare Bean M.D.   On: 04/22/2014 20:01     EKG Interpretation   Date/Time:  Monday April 22 2014 19:17:05 EST Ventricular Rate:  120 PR Interval:  126 QRS Duration: 90 QT Interval:  309 QTC Calculation: 436 R Axis:   76 Text Interpretation:  Sinus tachycardia Atrial premature complex RSR' in  V1 or V2, probably normal variant Minimal ST depression, inferior leads  Baseline wander in lead(s) I III aVL V1 V4 No old tracing to compare  Confirmed by [facility]  MD, ELLIOTT [phone]) on 04/22/2014 7:47:25 PM      Filed Vitals:   04/22/14 1913 04/22/14 2050  BP: 138/97 120/80  Pulse: 118 94  Temp: 99.3 F (37.4 C) 99.3 F (37.4 C)  TempSrc: Oral Oral  Resp: 18 18  SpO2: 97% 96%     MDM   Meds given in ED:  Medications - No data to display  Discharge Medication List as of 04/22/2014  8:39 PM    START taking these medications   Details  azithromycin (ZITHROMAX Z-PAK) 250 MG tablet Take 1 tablet (250 mg total) by mouth daily.  557m PO day 1, then 2537mPO days 2-5, Starting 04/22/2014, Until Discontinued, Print        Final diagnoses:  Community acquired pneumonia   Visit [age]ear old female with a 36-pack-year smoking history presents the emergency department complaining of productive cough for the past 3 weeks with fever since today. In the [employer]ed patient is afebrile and nontoxic appearing. The patient is not hypoxic or tachypneic. The patient is not tachycardic. Patient's chest x-ray indicated nonspecific patchy densities at the left base. We'll treat this patient with azithromycin for community-acquired pneumonia. Patient also given prescription for Delsym for cough. Advised patient she needs to follow-up with her primary care provider for repeat chest x-ray due to patchy densities at left base. I advised the patient to follow-up with their primary care provider this week. I advised the patient to return to the emergency department with new or worsening symptoms or new concerns. The patient verbalized understanding and agreement with plan.   This patient was discussed with Dr. WeEulis Fosterho agrees with assessment and plan.  WiHanley HaysPA-C 04/23/14 0142  ElRicharda BladeMD 04/23/14 2350

## 2014-04-22 NOTE — ED Notes (Signed)
Patient states that she has had productive cough x 3 weeks and now has a fever. Also notes chest pain. Alert and oriented.

## 2014-04-22 NOTE — Discharge Instructions (Signed)

## 2014-04-23 ENCOUNTER — Encounter: Payer: Self-pay | Admitting: Internal Medicine

## 2014-04-25 ENCOUNTER — Ambulatory Visit (INDEPENDENT_AMBULATORY_CARE_PROVIDER_SITE_OTHER): Payer: 59 | Admitting: Internal Medicine

## 2014-04-25 VITALS — BP 130/78 | HR 102 | Temp 98.3°F | Resp 16 | Ht 60.0 in | Wt 125.0 lb

## 2014-04-25 DIAGNOSIS — J189 Pneumonia, unspecified organism: Secondary | ICD-10-CM

## 2014-04-25 MED ORDER — MOXIFLOXACIN HCL 400 MG PO TABS: 400.0000 mg | ORAL_TABLET | Freq: Every day | ORAL | Status: DC

## 2014-04-25 MED ORDER — HYDROCOD POLST-CHLORPHEN POLST 10-8 MG/5ML PO LQCR: 5.0000 mL | Freq: Two times a day (BID) | ORAL | Status: DC | PRN

## 2014-04-25 NOTE — Patient Instructions (Signed)

## 2014-04-25 NOTE — Progress Notes (Signed)
   Subjective:    Patient ID: Paula Parker, female    DOB: 05-30-1960, 54 y.o.   MRN: [id number]  Cough This is a new problem. The current episode started in the past 7 days. The problem has been gradually improving. The problem occurs every few hours. The cough is productive of sputum. Associated symptoms include chills, a fever and sweats. Pertinent negatives include no chest pain, ear congestion, ear pain, headaches, heartburn, hemoptysis, myalgias, nasal congestion, postnasal drip, rash, rhinorrhea, sore throat, shortness of breath, weight loss or wheezing. Nothing aggravates the symptoms. Risk factors for lung disease include smoking/tobacco exposure. She has tried OTC cough suppressant for the symptoms. The treatment provided mild relief. Her past medical history is significant for pneumonia. There is no history of asthma, bronchiectasis, bronchitis, COPD, emphysema or environmental allergies.      Review of Systems  Constitutional: Positive for fever and chills. Negative for weight loss, diaphoresis, activity change, appetite change, fatigue and unexpected weight change.  HENT: Negative.  Negative for ear pain, postnasal drip, rhinorrhea, sore throat, trouble swallowing and voice change.   Eyes: Negative.   Respiratory: Positive for cough. Negative for apnea, hemoptysis, choking, chest tightness, shortness of breath, wheezing and stridor.   Cardiovascular: Negative.  Negative for chest pain, palpitations and leg swelling.  Gastrointestinal: Negative.  Negative for heartburn, nausea, vomiting, abdominal pain, diarrhea, constipation and blood in stool.  Endocrine: Negative.   Genitourinary: Negative.   Musculoskeletal: Negative.  Negative for myalgias, back pain and arthralgias.  Skin: Negative.  Negative for rash.  Allergic/Immunologic: Negative.  Negative for environmental allergies.  Neurological: Negative.  Negative for headaches.  Hematological: Negative.  Negative for adenopathy.  Does not bruise/bleed easily.  Psychiatric/Behavioral: Negative.        Objective:   Physical Exam  Constitutional: She is oriented to person, place, and time. She appears well-developed and well-nourished. No distress.  HENT:  Head: Normocephalic and atraumatic.  Mouth/Throat: Oropharynx is clear and moist. No oropharyngeal exudate.  Eyes: Conjunctivae are normal. Right eye exhibits no discharge. Left eye exhibits no discharge. No scleral icterus.  Neck: Normal range of motion. Neck supple. No JVD present. No tracheal deviation present. No thyromegaly present.  Cardiovascular: Normal rate, regular rhythm, normal heart sounds and intact distal pulses.  Exam reveals no gallop and no friction rub.   No murmur heard. Pulmonary/Chest: Effort normal and breath sounds normal. No accessory muscle usage or stridor. No respiratory distress. She has no decreased breath sounds. She has no wheezes. She has no rhonchi. She has no rales. She exhibits no tenderness.  Abdominal: Soft. Bowel sounds are normal. She exhibits no distension and no mass. There is no tenderness. There is no rebound and no guarding.  Musculoskeletal: Normal range of motion. She exhibits no edema or tenderness.  Lymphadenopathy:    She has no cervical adenopathy.  Neurological: She is oriented to person, place, and time.  Skin: Skin is warm and dry. No rash noted. She is not diaphoretic. No erythema. No pallor.  Vitals reviewed.         Assessment & Plan:

## 2014-04-25 NOTE — Progress Notes (Signed)
Pre visit review using our clinic review tool, if applicable. No additional management support is needed unless otherwise documented below in the visit note. 

## 2014-04-29 ENCOUNTER — Encounter: Payer: Self-pay | Admitting: Internal Medicine

## 2014-04-29 NOTE — Assessment & Plan Note (Signed)
She does not appear to be improving much on zithromax and with her history of smoking I am concerned that she is high risk for gm negs and resistant organisms so I have changed her antibiotic to avelox Will also offer tussionex susp for the cough

## 2014-04-30 ENCOUNTER — Encounter: Payer: Self-pay | Admitting: Physical Therapy

## 2014-04-30 ENCOUNTER — Ambulatory Visit: Payer: PRIVATE HEALTH INSURANCE | Attending: Orthopedic Surgery | Admitting: Physical Therapy

## 2014-04-30 DIAGNOSIS — S40011D Contusion of right shoulder, subsequent encounter: Secondary | ICD-10-CM | POA: Diagnosis not present

## 2014-04-30 DIAGNOSIS — E785 Hyperlipidemia, unspecified: Secondary | ICD-10-CM | POA: Insufficient documentation

## 2014-04-30 DIAGNOSIS — S40011S Contusion of right shoulder, sequela: Secondary | ICD-10-CM

## 2014-04-30 DIAGNOSIS — M25511 Pain in right shoulder: Secondary | ICD-10-CM

## 2014-04-30 DIAGNOSIS — K219 Gastro-esophageal reflux disease without esophagitis: Secondary | ICD-10-CM | POA: Diagnosis not present

## 2014-04-30 DIAGNOSIS — W228XXD Striking against or struck by other objects, subsequent encounter: Secondary | ICD-10-CM | POA: Insufficient documentation

## 2014-04-30 DIAGNOSIS — F17200 Nicotine dependence, unspecified, uncomplicated: Secondary | ICD-10-CM | POA: Insufficient documentation

## 2014-04-30 DIAGNOSIS — I10 Essential (primary) hypertension: Secondary | ICD-10-CM | POA: Diagnosis not present

## 2014-04-30 NOTE — Patient Instructions (Signed)
SHOULDER: External Rotation - Supine (Cane)   Hold cane with both hands. Rotate arm away from body. Keep elbow on floor and next to body. __10_ reps per set, __1-2_ sets per day, _7__ days per week Add towel to keep elbow at side.  Copyright  VHI. All rights reserved.  Cane Horizontal - Supine   With straight arms holding cane above shoulders, bring cane out to right, center, out to left, and back to above head. Repeat __10_ times. Do _2__ times per day.  Copyright  VHI. All rights reserved.  Cane Exercise: Flexion   Lie on back, holding cane above chest. Keeping arms as straight as possible, lower cane toward floor beyond head. Hold _5-10___ seconds. Repeat ___10_ times. Do ___2_ sessions per day.  http://gt2.exer.us/91   Copyright  VHI. All rights reserved.

## 2014-04-30 NOTE — Therapy (Signed)
[place], [place], [address] Phone: [phone]   Fax:  [phone]  Physical Therapy Evaluation  Patient Details  Name: Paula Parker MRN: [id number] Date of Birth: [birth year] Referring Provider:  Augustin Schooling, MD  Encounter Date: 04/30/2014      PT End of Session - 04/30/14 1541    Visit Number 1   Number of Visits 12   Date for PT Re-Evaluation 06/25/14   PT Start Time 1500   PT Stop Time 1553   PT Time Calculation (min) 53 min   Activity Tolerance Patient limited by pain      Past Medical History  Diagnosis Date  . Alcoholic fatty liver   . GLAUCOMA, BORDERLINE   . HYPERLIPIDEMIA   . HYPERTENSION   . PALPITATIONS, OCCASIONAL   . SMOKER   . GERD (gastroesophageal reflux disease)   . Pseudomembranous colitis   . Hiatal hernia     Past Surgical History  Procedure Laterality Date  . Cervix lesion destruction  1980'S  . Ovary surgery      removed precancerous cells with a laser    There were no vitals taken for this visit.  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 UE    Diagnostic tests XR   Patient Stated Goals get back to full duty work   Currently in Pain? Yes   Pain Score 2    Pain Location Shoulder   Pain Orientation Right   Pain Descriptors / Indicators Dull   Pain Type Chronic pain   Pain Radiating Towards lower arm at times   Pain Onset More than a month ago   Pain Frequency Intermittent   Aggravating Factors  using Rt UE    Pain Relieving Factors rest, Tylenol/Motrin, avoiding activities    Effect of Pain on Daily Activities cannot work as needed   Multiple Pain Sites No          [facility] PT Assessment - 04/30/14 1455    Assessment   Medical Diagnosis R shoulder contusion   Onset Date 03/15/14   Next MD Visit 05/01/14   Prior Therapy No   Precautions   Precautions None   Restrictions   Weight Bearing Restrictions No   Balance Screen   Has the patient fallen in the past 6 months No   Prior Function   Vocation Full time employment   Vocation Requirements --  pushing, pulling, reaching   AROM   Right Shoulder Extension 30 Degrees   Right Shoulder Flexion 80 Degrees   Right Shoulder ABduction 90 Degrees   Right Shoulder Internal Rotation --  L4, AROM at 90 deg abd 50   Right Shoulder External Rotation --  back of neck, at 90 deg ABD 55 deg   Left Shoulder Extension 55 Degrees   Left Shoulder Flexion 155 Degrees   Left Shoulder ABduction 158 Degrees   Left Shoulder Internal Rotation --  T5   Left Shoulder External Rotation --  T2   PROM   Overall PROM  --  ER 70, IR 72,  flex 160, abd 140 PAIN   Strength   Right Shoulder Flexion 4/5   Right Shoulder ABduction 4-/5   Right Shoulder Internal Rotation 4/5   Right Shoulder External Rotation 3+/5   Left Shoulder Flexion 4+/5   Left Shoulder ABduction 4/5   Palpation   Palpation tender post to ant shoulder in a cuff distribution         [facility] Adult PT Treatment/Exercise - 04/30/14 1534    Shoulder Exercises: ROM/Strengthening   Other ROM/Strengthening Exercises --  Supine cane exercises x 10 each (only tol x3-5 )   Other ROM/Strengthening Exercises ER/IR, flexion and chest press     Needs encouragement to move, advised to fully extend elbow (admits to guarding when up and working).  Explained that she should expect mild pain (,4/10) during ROM ex but it should not last.   Ice pack to Rt shoulder 10 min in supine, feels "numb" after session.          PT Education - 04/30/14 1541    Education provided Yes    Education Details PT/POC, HEP/AAROM   Person(s) Educated Patient   Methods Demonstration;Handout;Explanation   Comprehension Verbalized understanding;Verbal cues required;Need further instruction          PT Short Term Goals - 04/30/14 1559    PT SHORT TERM GOAL #1   Title Pt will be I with HEP for Rt UE ROM, strength   Time 3   Period Weeks   Status New   PT SHORT TERM GOAL #2   Title Pt. will report being able to use Rt UE for ADLs >50% of the time, pain minimal   Time 3   Period Weeks   Status New   PT SHORT TERM GOAL #3   Title Pt will be able to reach to 110 deg for work, home tasks with min pain increase   Time 3   Period Weeks           PT Long Term Goals - 04/30/14 1602    PT LONG TERM GOAL #1   Title Pt. will be I with principles of RICE, posture and body mechanics for work duties.   Time 6   Period Weeks   Status New   PT LONG TERM GOAL #2   Title Pt will be able to lift 25lbs or more and report min occ pain (as allowed by MD)   Time 6   Period Weeks   Status New   PT LONG TERM GOAL #3   Title Pt will regain full AROM in Rt UE with pain controlled   Time 6   Period Weeks   PT LONG TERM GOAL #4   Title Pt. will be able to work a full day with min pain overall   Time 6   Period Weeks               Plan - 04/30/14 1542    Clinical Impression Statement Pt present with significant limitations in AROM, strength and functional use of Rt. UE following a work injury.  Has full PROM. She did not tolerate more than 3-5 reps of each exercise despite encouragement.  Pain during ROM was 3/10.  She will benefit from skilled PT to improve functional use of Rt UE for return to work and [employer] comfort with ADLs, home tasks.    Pt will benefit from skilled therapeutic intervention in order to improve on the following deficits Decreased range of motion;Difficulty walking;Postural dysfunction;Decreased activity  tolerance;Impaired UE functional use;Pain;Decreased  mobility;Decreased strength   Rehab Potential Excellent   PT Frequency 2x / week   PT Duration 6 weeks  12 visits WC   PT Treatment/Interventions ADLs/Self Care Home Management;Cryotherapy;Electrical Stimulation;Functional mobility training;Neuromuscular re-education;Ultrasound;Manual techniques;Other (comment);Passive range of motion;Therapeutic exercise;DME Instruction;Moist Heat;Therapeutic activities;Patient/family education  iontophoresis for Rt. UE   PT Next Visit Plan review supine cane ex, modalities   PT Home Exercise Plan AAROM supine cane   Consulted and Agree with Plan of Care Patient        Problem List Patient Active Problem List   Diagnosis Date Noted  . Cough 03/05/2014  . CAP (community acquired pneumonia) 07/19/2013  . Allergic rhinitis, cause unspecified 07/19/2013  . Dysphagia, pharyngoesophageal phase 04/27/2013  . GERD (gastroesophageal reflux disease) 04/17/2013  . Other screening mammogram 04/17/2013  . Atrophic vaginitis 02/03/2012  . Routine general medical examination at a health care facility 01/24/2012  . Periodic health assessment, Pap and pelvic 01/24/2012  . SMOKER 04/28/2010  . Alcoholic fatty liver 22/33/6122  . GLAUCOMA, BORDERLINE 04/27/2010  . HIATAL HERNIA 04/27/2010  . HYPERLIPIDEMIA 04/16/2010  . Essential hypertension, benign 06/02/2007    PAA,JENNIFER 04/30/2014, 4:16 PM  [facility] [address] [place], [place], [address] Phone: [phone]   Fax:  [phone]  By signing I understand that I am ordering/authorizing the use of Iontophoresis using 4 mg/mL of dexamethasone as a component of this plan of care.    Also gave patient a paper copy to bring to her appt tomorrow. Raeford Razor, PT 04/30/2014 4:18 PM Phone: [phone] Fax: [phone]

## 2014-05-02 ENCOUNTER — Encounter: Payer: Self-pay | Admitting: Rehabilitation

## 2014-05-06 ENCOUNTER — Encounter: Payer: Self-pay | Admitting: Rehabilitation

## 2014-05-08 ENCOUNTER — Other Ambulatory Visit (HOSPITAL_COMMUNITY): Payer: Self-pay | Admitting: Orthopedic Surgery

## 2014-05-08 ENCOUNTER — Encounter: Payer: Self-pay | Admitting: Rehabilitation

## 2014-05-08 DIAGNOSIS — M25512 Pain in left shoulder: Secondary | ICD-10-CM

## 2014-05-13 ENCOUNTER — Encounter: Payer: Self-pay | Admitting: Internal Medicine

## 2014-05-13 ENCOUNTER — Ambulatory Visit (INDEPENDENT_AMBULATORY_CARE_PROVIDER_SITE_OTHER): Payer: 59 | Admitting: Internal Medicine

## 2014-05-13 ENCOUNTER — Ambulatory Visit (INDEPENDENT_AMBULATORY_CARE_PROVIDER_SITE_OTHER)
Admission: RE | Admit: 2014-05-13 | Discharge: 2014-05-13 | Disposition: A | Discharge status: Home / Self Care | Payer: 59 | Source: Ambulatory Visit | Attending: Internal Medicine | Admitting: Internal Medicine

## 2014-05-13 VITALS — BP 138/86 | HR 71 | Temp 98.3°F | Resp 16 | Wt 136.0 lb

## 2014-05-13 DIAGNOSIS — I10 Essential (primary) hypertension: Secondary | ICD-10-CM

## 2014-05-13 DIAGNOSIS — J189 Pneumonia, unspecified organism: Secondary | ICD-10-CM

## 2014-05-13 NOTE — Patient Instructions (Signed)

## 2014-05-13 NOTE — Progress Notes (Signed)
Pre visit review using our clinic review tool, if applicable. No additional management support is needed unless otherwise documented below in the visit note. 

## 2014-05-14 NOTE — Assessment & Plan Note (Signed)
Her BP is well controlled 

## 2014-05-14 NOTE — Assessment & Plan Note (Signed)
Her symptoms are resolving and her CXR is normal

## 2014-05-14 NOTE — Progress Notes (Signed)
   Subjective:    Patient ID: Paula Parker, female    DOB: 1960/08/13, 54 y.o.   MRN: [id number]  Cough This is a recurrent problem. The current episode started 1 to 4 weeks ago. The problem has been rapidly improving. The cough is non-productive. Pertinent negatives include no chest pain, chills, ear congestion, ear pain, fever, headaches, heartburn, hemoptysis, myalgias, nasal congestion, postnasal drip, rash, rhinorrhea, sore throat, shortness of breath, sweats, weight loss or wheezing. Risk factors for lung disease include smoking/tobacco exposure. She has tried prescription cough suppressant for the symptoms. The treatment provided significant relief. Her past medical history is significant for pneumonia.      Review of Systems  Constitutional: Negative.  Negative for fever, chills, weight loss, diaphoresis, appetite change and fatigue.  HENT: Negative.  Negative for ear pain, postnasal drip, rhinorrhea and sore throat.   Eyes: Negative.   Respiratory: Positive for cough. Negative for hemoptysis, choking, shortness of breath, wheezing and stridor.   Cardiovascular: Negative.  Negative for chest pain, palpitations and leg swelling.  Gastrointestinal: Negative.  Negative for heartburn, nausea, vomiting, abdominal pain, diarrhea, constipation and blood in stool.  Endocrine: Negative.   Genitourinary: Negative.   Musculoskeletal: Negative.  Negative for myalgias, back pain and arthralgias.  Skin: Negative.  Negative for rash.  Allergic/Immunologic: Negative.   Neurological: Negative.  Negative for headaches.  Hematological: Negative.  Negative for adenopathy. Does not bruise/bleed easily.  Psychiatric/Behavioral: Negative.        Objective:   Physical Exam  Constitutional: She is oriented to person, place, and time. She appears well-developed and well-nourished. No distress.  HENT:  Head: Normocephalic and atraumatic.  Mouth/Throat: Oropharynx is clear and moist. No oropharyngeal  exudate.  Eyes: Conjunctivae are normal. Right eye exhibits no discharge. Left eye exhibits no discharge. No scleral icterus.  Neck: Normal range of motion. Neck supple. No JVD present. No tracheal deviation present. No thyromegaly present.  Cardiovascular: Normal rate, regular rhythm, normal heart sounds and intact distal pulses.  Exam reveals no gallop and no friction rub.   No murmur heard. Pulmonary/Chest: Effort normal and breath sounds normal. No stridor. No respiratory distress. She has no wheezes. She has no rales. She exhibits no tenderness.  Abdominal: Soft. Bowel sounds are normal. She exhibits no distension and no mass. There is no tenderness. There is no rebound and no guarding.  Musculoskeletal: Normal range of motion. She exhibits no edema or tenderness.  Lymphadenopathy:    She has no cervical adenopathy.  Neurological: She is oriented to person, place, and time.  Skin: Skin is warm and dry. No rash noted. She is not diaphoretic. No erythema. No pallor.  Vitals reviewed.         Assessment & Plan:

## 2014-05-23 ENCOUNTER — Ambulatory Visit (HOSPITAL_COMMUNITY)
Admission: RE | Admit: 2014-05-23 | Discharge: 2014-05-23 | Disposition: A | Discharge status: Home / Self Care | Payer: PRIVATE HEALTH INSURANCE | Source: Ambulatory Visit | Attending: Orthopedic Surgery | Admitting: Orthopedic Surgery

## 2014-05-23 DIAGNOSIS — M898X1 Other specified disorders of bone, shoulder: Secondary | ICD-10-CM | POA: Diagnosis not present

## 2014-05-23 DIAGNOSIS — M25511 Pain in right shoulder: Secondary | ICD-10-CM | POA: Insufficient documentation

## 2014-05-23 DIAGNOSIS — M25512 Pain in left shoulder: Secondary | ICD-10-CM

## 2014-05-23 DIAGNOSIS — M778 Other enthesopathies, not elsewhere classified: Secondary | ICD-10-CM | POA: Diagnosis not present

## 2014-06-27 ENCOUNTER — Ambulatory Visit: Payer: 59 | Admitting: Physical Therapy

## 2014-06-27 ENCOUNTER — Ambulatory Visit: Payer: 59 | Attending: Orthopedic Surgery | Admitting: Physical Therapy

## 2014-06-27 DIAGNOSIS — W228XXD Striking against or struck by other objects, subsequent encounter: Secondary | ICD-10-CM | POA: Diagnosis not present

## 2014-06-27 DIAGNOSIS — E785 Hyperlipidemia, unspecified: Secondary | ICD-10-CM | POA: Diagnosis not present

## 2014-06-27 DIAGNOSIS — I10 Essential (primary) hypertension: Secondary | ICD-10-CM | POA: Diagnosis not present

## 2014-06-27 DIAGNOSIS — S40011D Contusion of right shoulder, subsequent encounter: Secondary | ICD-10-CM | POA: Diagnosis present

## 2014-06-27 DIAGNOSIS — S40011S Contusion of right shoulder, sequela: Secondary | ICD-10-CM

## 2014-06-27 DIAGNOSIS — M25511 Pain in right shoulder: Secondary | ICD-10-CM

## 2014-06-27 DIAGNOSIS — K219 Gastro-esophageal reflux disease without esophagitis: Secondary | ICD-10-CM | POA: Insufficient documentation

## 2014-06-27 NOTE — Patient Instructions (Signed)
Copyright  VHI. All rights reserved.  Strengthening: Shoulder Shrug (Phase 1)   Shrug shoulders up and down, forward and backward. Repeat __10_ times per set. Do ____ sets per session. Do ___5_ sessions per day.  http://orth.exer.us/336   Copyright  VHI. All rights reserved.  Copyright  VHI. All rights reserved.  Cane Exercise: Flexion   Lie on back, holding cane above chest. Keeping arms as straight as possible, lower cane toward floor beyond head. Hold ___20_ seconds. Repeat _3-5.___ times. Do __2_ sessions per day.  http://gt2.exer.us/91   Copyright  VHI. All rights reserved.  Posture - Sitting   Sit upright, head facing forward. Try using a roll to support lower back. Keep shoulders relaxed, and avoid rounded back. Keep hips level with knees. Avoid crossing legs for long periods.  Copyright  VHI. All rights reserved.   Cryotherapy  ICE 20 mins at most, 3 times a day following exercises.   Cryotherapy means treatment with cold. Ice or gel packs can be used to reduce both pain and swelling. Ice is the most helpful within the first 24 to 48 hours after an injury or flare-up from overusing a muscle or joint. Sprains, strains, spasms, burning pain, shooting pain, and aches can all be eased with ice. Ice can also be used when recovering from surgery. Ice is effective, has very few side effects, and is safe for most people to use. PRECAUTIONS  Ice is not a safe treatment option for people with:  Raynaud phenomenon. This is a condition affecting small blood vessels in the extremities. Exposure to cold may cause your problems to return.  Cold hypersensitivity. There are many forms of cold hypersensitivity, including:  Cold urticaria. Red, itchy hives appear on the skin when the tissues begin to warm after being iced.  Cold erythema. This is a red, itchy rash caused by exposure to cold.  Cold hemoglobinuria. Red blood cells break down when the tissues begin to warm after  being iced. The hemoglobin that carry oxygen are passed into the urine because they cannot combine with blood proteins fast enough.  Numbness or altered sensitivity in the area being iced. If you have any of the following conditions, do not use ice until you have discussed cryotherapy with your caregiver:  Heart conditions, such as arrhythmia, angina, or chronic heart disease.  High blood pressure.  Healing wounds or open skin in the area being iced.  Current infections.  Rheumatoid arthritis.  Poor circulation.  Diabetes. Ice slows the blood flow in the region it is applied. This is beneficial when trying to stop inflamed tissues from spreading irritating chemicals to surrounding tissues. However, if you expose your skin to cold temperatures for too long or without the proper protection, you can damage your skin or nerves. Watch for signs of skin damage due to cold. HOME CARE INSTRUCTIONS Follow these tips to use ice and cold packs safely.  Place a dry or damp towel between the ice and skin. A damp towel will cool the skin more quickly, so you may need to shorten the time that the ice is used.  For a more rapid response, add gentle compression to the ice.  Ice for no more than 20 minutes at a time. The bonier the area you are icing, the less time it will take to get the benefits of ice.  Check your skin after 5 minutes to make sure there are no signs of a poor response to cold or skin damage.  Rest 20  minutes or more between uses.  Once your skin is numb, you can end your treatment. You can test numbness by very lightly touching your skin. The touch should be so light that you do not see the skin dimple from the pressure of your fingertip. When using ice, most people will feel these normal sensations in this order: cold, burning, aching, and numbness.  Do not use ice on someone who cannot communicate their responses to pain, such as small children or people with dementia. HOW TO  MAKE AN ICE PACK Ice packs are the most common way to use ice therapy. Other methods include ice massage, ice baths, and cryosprays. Muscle creams that cause a cold, tingly feeling do not offer the same benefits that ice offers and should not be used as a substitute unless recommended by your caregiver. To make an ice pack, do one of the following:  Place crushed ice or a bag of frozen vegetables in a sealable plastic bag. Squeeze out the excess air. Place this bag inside another plastic bag. Slide the bag into a pillowcase or place a damp towel between your skin and the bag.  Mix 3 parts water with 1 part rubbing alcohol. Freeze the mixture in a sealable plastic bag. When you remove the mixture from the freezer, it will be slushy. Squeeze out the excess air. Place this bag inside another plastic bag. Slide the bag into a pillowcase or place a damp towel between your skin and the bag. SEEK MEDICAL CARE IF:  You develop white spots on your skin. This may give the skin a blotchy (mottled) appearance.  Your skin turns blue or pale.  Your skin becomes waxy or hard.  Your swelling gets worse. MAKE SURE YOU:   Understand these instructions.  Will watch your condition.  Will get help right away if you are not doing well or get worse. Document Released: 11/16/2010 Document Revised: 08/06/2013 Document Reviewed: 11/16/2010 [facility] Patient Information 2015 [place], [place]. This information is not intended to replace advice given to you by your health care provider. Make sure you discuss any questions you have with your health care provider.

## 2014-06-27 NOTE — Therapy (Signed)
[place], [place], [address] Phone: [phone]   Fax:  [phone]  Physical Therapy Evaluation  Patient Details  Name: Paula Parker MRN: [id number] Date of Birth: May 19, 1960 Referring Provider:  Netta Cedars, MD  Encounter Date: 06/27/2014      PT End of Session - 06/27/14 1825    Visit Number 1   Number of Visits 12   Date for PT Re-Evaluation 08/08/14   PT Start Time 0800   PT Stop Time 0845   PT Time Calculation (min) 45 min   Activity Tolerance Patient limited by pain;Patient tolerated treatment well      Past Medical History  Diagnosis Date  . Alcoholic fatty liver   . GLAUCOMA, BORDERLINE   . HYPERLIPIDEMIA   . HYPERTENSION   . PALPITATIONS, OCCASIONAL   . SMOKER   . GERD (gastroesophageal reflux disease)   . Pseudomembranous colitis   . Hiatal hernia     Past Surgical History  Procedure Laterality Date  . Cervix lesion destruction  1980'S  . Ovary surgery      removed precancerous cells with a laser    There were no vitals filed for this visit.  Visit Diagnosis:  Shoulder contusion, right, sequela - Plan: PT plan of care cert/re-cert  Pain in joint, shoulder region, right - Plan: PT plan of care cert/re-cert      Subjective Assessment - 06/27/14 0801    Symptoms December 2015 was cleaning at work, bent over and hit right shoulder on the corner of the sink.  Currently on light duty and has to switch hands because of right shoulder pain.  No lifting 10# or overhead until after therapy.  1 previous visit to PT but discontinued to have MRI.  Some difficulty sleeping.    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 no difficulty   Diagnostic tests MRI 2 months ago    Patient Stated Goals arm to be stronger;  carry more than 10#; be able to reach overhead    Currently in Pain? Yes   Pain Score 1    Pain Location Shoulder   Pain Orientation Right   Pain Type Chronic pain   Pain Onset More than a month ago   Pain Frequency Intermittent   Aggravating Factors  lifting, reaching overhead; right sidelying; repetitive use of right UE   Pain Relieving Factors not moving arm; ice            [facility] PT Assessment - 06/27/14 0808    Assessment   Medical Diagnosis right shoulder pain   Onset Date 03/15/14   Next MD Visit April 22nd   Prior Therapy 04/30/14   Precautions   Precautions --  no lift 10#, no overhead at work   Restrictions   Weight Bearing Restrictions No   Balance Screen   Has the patient fallen in the past 6 months No   Has the patient had a decrease in activity level because of a fear of falling?  No   Is the patient reluctant to leave their home because of a fear of falling?  No   Home Environment   Living Enviornment Private residence   Living Arrangements Spouse/significant other   Prior Function   Level of [place] with basic ADLs   Vocation Full time  employment   Vocation Requirements --  hospital housekeeping   Leisure hang out with grandkids  play [employer]   Observation/Other Assessments   Focus on Therapeutic Outcomes (FOTO)  43%   ROM / Strength   AROM / PROM / Strength Strength   AROM   Right Shoulder Flexion 130 Degrees   Right Shoulder ABduction 128 Degrees   Right Shoulder Internal Rotation --  behind back to T10   Right Shoulder External Rotation 66 Degrees   Left Shoulder Flexion 163 Degrees   Left Shoulder ABduction 178 Degrees   Left Shoulder Internal Rotation --  behind back to T6   Left Shoulder External Rotation 68 Degrees   Strength   Right Shoulder Flexion 4-/5   Right Shoulder ABduction 4-/5   Right Shoulder Internal Rotation 4-/5   Right Shoulder External Rotation 4-/5   Left Shoulder Flexion 4+/5   Left Shoulder ABduction  4+/5   Left Shoulder External Rotation 4+/5   Palpation   Palpation tender post to ant shoulder in a cuff distribution   Special Tests    Special Tests Rotator Cuff Impingement   Rotator Cuff Impingment tests Empty Can test;Hawkins- Kennedy test;Lag signs at 0 degrees;Drop Arm test   Hawkins-Kennedy test   Findings Positive   Side Right   Empty Can test   Findings Positive   Side Right   Lag time at 0 degrees   Findings Negative   Drop Arm test   Findings Negative                           PT Education - 06/27/14 1824    Education provided Yes   Education Details cane flexion; scapular retraction/depression; use of ice for pain control   Person(s) Educated Patient   Methods Explanation;Demonstration;Handout   Comprehension Verbalized understanding;Returned demonstration          PT Short Term Goals - 06/27/14 1846    PT SHORT TERM GOAL #1   Title Pt will be I with initial basic  HEP for Rt UE ROM, strength   Time 3   Period Weeks   Status New   PT SHORT TERM GOAL #2   Title Patient will report a 25% reduction in pain with household and work duties using right UE   Time 3   Period Weeks   Status New   PT SHORT TERM GOAL #3   Title Pt will be able to reach to 140 deg for work, home tasks with min pain increase   Time 3   Period Weeks           PT Long Term Goals - 06/27/14 1848    PT LONG TERM GOAL #1   Title Pt. will be I with principles of RICE, posture and body mechanics for work duties.   Time 6   Period Weeks   Status New   PT LONG TERM GOAL #2   Title Patient will have right shoulder flexion to 150 degrees needed for reaching eye level and above for work duties.     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  In December, she bent over to reach something hitting her right shoulder against the sink.  She has continued difficulty reaching overhead and repetitive use of right shoulder.  She is on light duty with no lift >10# and no overhead at work.   She was referred to PT in January and came for the eval but treatment was put on on hold while she had an MRI.  MRI showed mild GH and AC degen changes and mild supraspinatus tendinosis, no evidence of RC,labral or Biceps tendon tear.  Her ROM is better than her previous PT eval but still painful and limited:  right flex 130, abd 128, ER 66, IR behind back to T10.  GH and scapular strength grossly 4-/5.  Pain with Michel Bickers, active compression test ;  negative lag sign or drop arm test.  Tender posterior right shoulder musculature.  Poor posture with rounded shoulders.  Decreased right scapular mobility.  Patient would benefit from PT to address these deficits.   Pt will benefit from skilled therapeutic intervention in order to improve on the following deficits Pain;Decreased range of motion;Decreased strength   Rehab Potential Good   PT Frequency 2x / week   PT Duration 6 weeks   PT Treatment/Interventions Cryotherapy;Electrical Stimulation;Ultrasound;Moist Heat;Manual techniques;Therapeutic exercise;Passive range of motion;Patient/family education  request ionto order with 67m/ml dexamethasone   PT Next Visit Plan review scapular retract/depression and supine cane flexion;  start ionto if order signed by MD; try prone rows, extensions or standing yellow band;        By signing I understand that I am ordering/authorizing the use of Iontophoresis using 4 mg/mL of dexamethasone as a component of  this plan of care.   Problem List Patient Active Problem List   Diagnosis Date Noted  . CAP (community acquired pneumonia) 07/19/2013  . Allergic rhinitis, cause unspecified 07/19/2013  . Dysphagia, pharyngoesophageal phase 04/27/2013  . GERD (gastroesophageal reflux disease) 04/17/2013  . Other screening mammogram 04/17/2013  . Atrophic vaginitis 02/03/2012  . Routine general medical examination at a health care facility 01/24/2012  . Periodic health assessment, Pap and pelvic 01/24/2012  . SMOKER 04/28/2010  . Alcoholic fatty liver 088/02/314 . GLAUCOMA, BORDERLINE 04/27/2010  . HIATAL HERNIA 04/27/2010  . HYPERLIPIDEMIA 04/16/2010  . Essential hypertension, benign 06/02/2007    SAlvera Singh3/24/2016, 6:56 PM  [facility] [address]Phone: [phone]  Fax:  [phone] SRuben Im PT 06/27/2014 6:57 PM Phone: [phone]: [phone]

## 2014-07-09 ENCOUNTER — Ambulatory Visit: Payer: 59 | Attending: Orthopedic Surgery | Admitting: Physical Therapy

## 2014-07-09 DIAGNOSIS — E785 Hyperlipidemia, unspecified: Secondary | ICD-10-CM | POA: Insufficient documentation

## 2014-07-09 DIAGNOSIS — K219 Gastro-esophageal reflux disease without esophagitis: Secondary | ICD-10-CM | POA: Diagnosis not present

## 2014-07-09 DIAGNOSIS — I10 Essential (primary) hypertension: Secondary | ICD-10-CM | POA: Diagnosis not present

## 2014-07-09 DIAGNOSIS — Z87891 Personal history of nicotine dependence: Secondary | ICD-10-CM | POA: Insufficient documentation

## 2014-07-09 DIAGNOSIS — S40011S Contusion of right shoulder, sequela: Secondary | ICD-10-CM

## 2014-07-09 DIAGNOSIS — M25511 Pain in right shoulder: Secondary | ICD-10-CM | POA: Insufficient documentation

## 2014-07-09 DIAGNOSIS — S40011D Contusion of right shoulder, subsequent encounter: Secondary | ICD-10-CM | POA: Insufficient documentation

## 2014-07-09 NOTE — Patient Instructions (Signed)
Info on dry needling

## 2014-07-09 NOTE — Therapy (Signed)
[place], [place], [address] Phone: [phone]   Fax:  [phone]  Physical Therapy Treatment  Patient Details  Name: Paula Parker MRN: [id number] Date of Birth: September 05, 1960 Referring Provider:  Janith Lima, MD  Encounter Date: 07/09/2014      PT End of Session - 07/09/14 1632    Visit Number 2   Number of Visits 12   Date for PT Re-Evaluation 08/08/14   Authorization Type W/C   PT Start Time 4628   PT Stop Time 1632   PT Time Calculation (min) 47 min   Activity Tolerance Patient limited by pain      Past Medical History  Diagnosis Date  . Alcoholic fatty liver   . GLAUCOMA, BORDERLINE   . HYPERLIPIDEMIA   . HYPERTENSION   . PALPITATIONS, OCCASIONAL   . SMOKER   . GERD (gastroesophageal reflux disease)   . Pseudomembranous colitis   . Hiatal hernia     Past Surgical History  Procedure Laterality Date  . Cervix lesion destruction  1980'S  . Ovary surgery      removed precancerous cells with a laser    There were no vitals filed for this visit.  Visit Diagnosis:  Shoulder contusion, right, sequela  Pain in joint, shoulder region, right      Subjective Assessment - 07/09/14 1545    Subjective It's getting there but last week I did something and it popped and it started hurting me.  States she is doing a lot of circular motions at work.  Repetive motions bother so I switch off to my left side.     Currently in Pain? Yes   Pain Score 3   no pain at rest   Pain Location Shoulder   Pain Orientation Right  superior shoulder   Pain Type Chronic pain   Pain Onset More than a month ago   Pain Frequency Intermittent   Aggravating Factors  circular motions or side to side use of right UE;  trying to Parker overhead; reaching out to the side   Pain Relieving Factors ice; not moving                       [place] [facility] PT Treatment/Exercise - 07/09/14 1552    Exercises   Exercises Shoulder   Shoulder Exercises: Seated   Other Seated Exercises Thoracic extension over yellow ball 15x   Shoulder Exercises: Prone   Extension Strengthening;Right;10 reps   Horizontal ABduction 1 Right;10 reps   Other Prone Exercises rows 10x right   Shoulder Exercises: Sidelying   External Rotation Right;10 reps   Shoulder Exercises: Standing   External Rotation Right;10 reps;Theraband   Theraband Level (Shoulder External Rotation) Level 1 (Yellow)   Internal Rotation Strengthening;Right;10 reps;Theraband   Extension Strengthening;Right;10 reps;Theraband   Row Strengthening;Right;10 reps;Theraband   Theraband Level (Shoulder Row) Level 1 (Yellow)   Manual Therapy   Manual Therapy Joint mobilization;Scapular mobilization;Myofascial release  kinesiotaping right upper trap   Joint Mobilization GH distraction, inferior, posterior grade 2 at early and mid range right 10x each   Myofascial Release rhomboids, levator scap, upper trap   Scapular Mobilization grade 2 med/lat and superior/inferior 10x each                PT Education - 07/09/14 1631    Education provided Yes   Education Details info on dry needling   Person(s) Educated Patient   Methods Explanation;Demonstration;Handout  Comprehension Verbalized understanding          PT Short Term Goals - 07/09/14 1728    PT SHORT TERM GOAL #1   Title Pt will be I with initial basic  HEP for Rt UE ROM, strength   Time 3   Period Weeks   Status On-going   PT SHORT TERM GOAL #2   Title Patient will report a 25% reduction in pain with household and work duties using right UE   Time 3   Period Weeks   Status On-going   PT SHORT TERM GOAL #3   Title Pt will be able to Parker to 140 deg for work, home tasks with min pain increase   Time 3   Period Weeks   Status On-going           PT Long Term Goals - 07/09/14 1728    PT LONG TERM GOAL #1   Title Pt. will be I with principles of RICE, posture and body  mechanics for work duties.   Time 6   Period Weeks   Status On-going   PT LONG TERM GOAL #2   Title Patient will have right shoulder flexion to 150 degrees needed for reaching eye level and above for work duties.     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     Time 6   Period Weeks   Status On-going   PT LONG TERM GOAL #4   Title FOTO functional outcome score improved from 43% to 33% indicating improved function with less pain   Time 6   Period Weeks   Status On-going               Plan - 07/09/14 1632    Clinical Impression Statement The patient continues to be quite painful especially with work activities.  Multiple periscapular trigger points which limit scapular mobility and upper trap trigger points from overuse/compensation.  Discussed dry needling, patient wants to research and think about this.  Verbal and tactile cues to encourage more scapular use with Brinckerhoff patterns.   Patient reports no overall increase in pain following treatment session.  She declines cryotherapy, preferring to apply her own ice pack at home.   PT Next Visit Plan recheck shoulder AROM;  patient considering dry needling for periscapular trigger points;  assess response to kinesiotaping; ionto request was resent today;  review yellow band exercises and add to HEP next visit        Problem List Patient Active Problem List   Diagnosis Date Noted  . CAP (community acquired pneumonia) 07/19/2013  . Allergic rhinitis, cause unspecified 07/19/2013  . Dysphagia, pharyngoesophageal phase 04/27/2013  . GERD (gastroesophageal reflux disease) 04/17/2013  . Other screening mammogram 04/17/2013  . Atrophic vaginitis 02/03/2012  . Routine general medical examination at a health care facility 01/24/2012  . Periodic health assessment, Pap and pelvic 01/24/2012  . SMOKER 04/28/2010  .  Alcoholic fatty liver 70/17/7939  . GLAUCOMA, BORDERLINE 04/27/2010  . HIATAL HERNIA 04/27/2010  . HYPERLIPIDEMIA 04/16/2010  . Essential hypertension, benign 06/02/2007    Alvera Singh 07/09/2014, 5:37 PM  [facility] [address] [place], [place], [address] Phone: [phone]   Fax:  [phone]  Ruben Im, PT 07/09/2014 5:38 PM Phone: [phone] Fax: [phone]

## 2014-07-10 ENCOUNTER — Ambulatory Visit: Payer: 59 | Admitting: Physical Therapy

## 2014-07-10 DIAGNOSIS — M25511 Pain in right shoulder: Secondary | ICD-10-CM

## 2014-07-10 DIAGNOSIS — S40011S Contusion of right shoulder, sequela: Secondary | ICD-10-CM

## 2014-07-10 DIAGNOSIS — S40011D Contusion of right shoulder, subsequent encounter: Secondary | ICD-10-CM | POA: Diagnosis not present

## 2014-07-10 NOTE — Therapy (Signed)
[place], [place], [address] Phone: [phone]   Fax:  [phone]  Physical Therapy Treatment  Patient Details  Name: Paula Parker MRN: [id number] Date of Birth: March 19, 1961 Referring Provider:  Janith Lima, MD  Encounter Date: 07/10/2014      PT End of Session - 07/10/14 1552    Visit Number 3   Number of Visits 12   Date for PT Re-Evaluation 08/08/14   Authorization Type W/C   PT Start Time 9833   PT Stop Time 0445   PT Time Calculation (min) 58 min      Past Medical History  Diagnosis Date  . Alcoholic fatty liver   . GLAUCOMA, BORDERLINE   . HYPERLIPIDEMIA   . HYPERTENSION   . PALPITATIONS, OCCASIONAL   . SMOKER   . GERD (gastroesophageal reflux disease)   . Pseudomembranous colitis   . Hiatal hernia     Past Surgical History  Procedure Laterality Date  . Cervix lesion destruction  1980'S  . Ovary surgery      removed precancerous cells with a laser    There were no vitals filed for this visit.  Visit Diagnosis:  Shoulder contusion, right, sequela  Pain in joint, shoulder region, right      Subjective Assessment - 07/10/14 1551    Subjective I am sore from yesterday 's workout. The tape is reminding me to use my arm.    Currently in Pain? No/denies            [facility] PT Assessment - 07/10/14 1601    AROM   Right Shoulder Flexion 110 Degrees   Right Shoulder ABduction 80 Degrees  c/o N/T in arm/fingers   Right Shoulder Internal Rotation --  behind back to T10   Right Shoulder External Rotation --  reach to T4                   [facility] Adult PT Treatment/Exercise - 07/10/14 1553    Shoulder Exercises: Standing   External Rotation Right;10 reps;Theraband   Theraband Level (Shoulder External Rotation) Level 1 (Yellow)   Internal Rotation Strengthening;Right;10 reps;Theraband   Flexion Strengthening;Right;10 reps   Theraband Level (Shoulder Flexion) Level  1 (Yellow)   Row Strengthening;Right;10 reps;Theraband   Theraband Level (Shoulder Row) Level 1 (Yellow)   Shoulder Exercises: Pulleys   Flexion 2 minutes   Shoulder Exercises: ROM/Strengthening   UBE (Upper Arm Bike) 3 min forward, 1 minutes back level 1  c/o increased pain with backward   Other ROM/Strengthening Exercises wall ladder x 3 for flexion  c/o N/T right arm   Modalities   Modalities Moist Heat   Moist Heat Therapy   Number Minutes Moist Heat 15 Minutes   Moist Heat Location Other (comment)  right periscap and upper trap supine   Manual Therapy   Manual Therapy Joint mobilization;Scapular mobilization;Myofascial release   Joint Mobilization GH distraction, inferior, posterior grade 2 at early and mid range right 10x each   Myofascial Release Trigger point release rhomboids, levator scap, upper trap   Scapular Mobilization grade 2 ant/post and superior/inferior 10x each                PT Education - 07/10/14 1638    Education provided Yes   Education Details [place] yellow theraband   Person(s) Educated Patient   Methods Explanation;Handout;Tactile cues;Demonstration;Verbal cues   Comprehension Verbalized understanding;Returned demonstration          PT Short  Term Goals - 07/09/14 1728    PT SHORT TERM GOAL #1   Title Pt will be I with initial basic  HEP for Rt UE ROM, strength   Time 3   Period Weeks   Status On-going   PT SHORT TERM GOAL #2   Title Patient will report a 25% reduction in pain with household and work duties using right UE   Time 3   Period Weeks   Status On-going   PT SHORT TERM GOAL #3   Title Pt will be able to reach to 140 deg for work, home tasks with min pain increase   Time 3   Period Weeks   Status On-going           PT Long Term Goals - 07/09/14 1728    PT LONG TERM GOAL #1   Title Pt. will be I with principles of RICE, posture and body mechanics for work duties.   Time 6   Period Weeks   Status On-going   PT  LONG TERM GOAL #2   Title Patient will have right shoulder flexion to 150 degrees needed for reaching eye level and above for work duties.     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     Time 6   Period Weeks   Status On-going   PT LONG TERM GOAL #4   Title FOTO functional outcome score improved from 43% to 33% indicating improved function with less pain   Time 6   Period Weeks   Status On-going               Plan - 07/10/14 1654    Clinical Impression Statement Decreased soreness and tension after manual today. Able to add [place] theraband exercises to HEP. Decreased AROM today and new c/o N/T in RUE. Pt reluctant to consider dry needling. [place] faxed today due to electrionic signature not received on 2 attempts.    PT Next Visit Plan recheck shoulder AROM;    review yellow band exercises, continue manual , check for ionto order        Problem List Patient Active Problem List   Diagnosis Date Noted  . CAP (community acquired pneumonia) 07/19/2013  . Allergic rhinitis, cause unspecified 07/19/2013  . Dysphagia, pharyngoesophageal phase 04/27/2013  . GERD (gastroesophageal reflux disease) 04/17/2013  . Other screening mammogram 04/17/2013  . Atrophic vaginitis 02/03/2012  . Routine general medical examination at a health care facility 01/24/2012  . Periodic health assessment, Pap and pelvic 01/24/2012  . SMOKER 04/28/2010  . Alcoholic fatty liver 18/29/9371  . GLAUCOMA, BORDERLINE 04/27/2010  . HIATAL HERNIA 04/27/2010  . HYPERLIPIDEMIA 04/16/2010  . Essential hypertension, benign 06/02/2007    Dorene Ar, PTA 07/10/2014, 5:08 PM  [facility] [address] [place], [place], [address] Phone: [phone]   Fax:  [phone]

## 2014-07-15 ENCOUNTER — Ambulatory Visit: Payer: 59 | Admitting: Physical Therapy

## 2014-07-15 DIAGNOSIS — M25511 Pain in right shoulder: Secondary | ICD-10-CM

## 2014-07-15 DIAGNOSIS — S40011S Contusion of right shoulder, sequela: Secondary | ICD-10-CM

## 2014-07-15 DIAGNOSIS — S40011D Contusion of right shoulder, subsequent encounter: Secondary | ICD-10-CM | POA: Diagnosis not present

## 2014-07-15 NOTE — Therapy (Addendum)
[place], [place], [address] Phone: [phone]   Fax:  [phone]  Physical Therapy Treatment  Patient Details  Name: Paula Parker MRN: [id number] Date of Birth: 1960/10/12 Referring Provider:  Janith Lima, MD  Encounter Date: 07/15/2014      PT End of Session - 07/15/14 1638    Visit Number 4   Number of Visits 12   Date for PT Re-Evaluation 08/08/14   PT Start Time 0345   PT Stop Time 0437   PT Time Calculation (min) 52 min      Past Medical History  Diagnosis Date  . Alcoholic fatty liver   . GLAUCOMA, BORDERLINE   . HYPERLIPIDEMIA   . HYPERTENSION   . PALPITATIONS, OCCASIONAL   . SMOKER   . GERD (gastroesophageal reflux disease)   . Pseudomembranous colitis   . Hiatal hernia     Past Surgical History  Procedure Laterality Date  . Cervix lesion destruction  1980'S  . Ovary surgery      removed precancerous cells with a laser    There were no vitals filed for this visit.  Visit Diagnosis:  Pain in joint, shoulder region, right  Shoulder contusion, right, sequela      Subjective Assessment - 07/15/14 1553    Subjective Pt. reported that she was sore after last treatment but it only lasted for about a day.  Pt. was experiencing no pain upon arrival, and then reported 3-4/10 pain level while doing exercises.  She reported that taping gave her hives and made her itchy.  She also said she became itchy upon iontophoresis application, thus, it was removed.        Currently in Pain? No/denies            [facility] PT Assessment - 07/15/14 1603    AROM   Right Shoulder Flexion 126 Degrees   Right Shoulder ABduction 113 Degrees                   [facility] Adult PT Treatment/Exercise - 07/15/14 1554    Shoulder Exercises: Standing   External Rotation Right;Theraband;15 reps   Theraband Level (Shoulder External Rotation) Level 1 (Yellow)   Internal Rotation  Strengthening;Right;Theraband;15 reps   Theraband Level (Shoulder Internal Rotation) Level 1 (Yellow)  cues to keep body still   Flexion Strengthening;Right;15 reps   Theraband Level (Shoulder Flexion) Level 1 (Yellow)   Row Strengthening;Right;Theraband;15 reps   Theraband Level (Shoulder Row) Level 1 (Yellow)  cues for positioning   Shoulder Exercises: Pulleys   Flexion 2 minutes   ABduction 2 minutes   ABduction Limitations pt. limited by pain  cues for proper arm position and technique   Shoulder Exercises: ROM/Strengthening   UBE (Upper Arm Bike) 2 min forward, 2 minutes back level 1   Other ROM/Strengthening Exercises wall ladder x 3 for flexion  no c/o N/T R arm; c/o pain in upper range   Modalities   Modalities Iontophoresis   Iontophoresis   Type of Iontophoresis Dexamethasone   Location Right shoulder   Dose 1.0 CC   Time pt. c/o itching immediately after application of ionto patch; patch removed and skin cleaned    Manual Therapy   Manual Therapy Joint mobilization;Scapular mobilization;Myofascial release;Passive ROM   Myofascial Release Trigger point release rhomboids   Scapular Mobilization grade 2 ant/post and superior/inferior 10x each   Passive ROM flexion, abduction, scaption to pt. tolerance  cues to relax  PT Short Term Goals - 07/09/14 1728    PT SHORT TERM GOAL #1   Title Pt will be I with initial basic  HEP for Rt UE ROM, strength   Time 3   Period Weeks   Status On-going   PT SHORT TERM GOAL #2   Title Patient will report a 25% reduction in pain with household and work duties using right UE   Time 3   Period Weeks   Status On-going   PT SHORT TERM GOAL #3   Title Pt will be able to reach to 140 deg for work, home tasks with min pain increase   Time 3   Period Weeks   Status On-going           PT Long Term Goals - 07/09/14 1728    PT LONG TERM GOAL #1   Title Pt. will be I with principles of RICE, posture and  body mechanics for work duties.   Time 6   Period Weeks   Status On-going   PT LONG TERM GOAL #2   Title Patient will have right shoulder flexion to 150 degrees needed for reaching eye level and above for work duties.     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     Time 6   Period Weeks   Status On-going   PT LONG TERM GOAL #4   Title FOTO functional outcome score improved from 43% to 33% indicating improved function with less pain   Time 6   Period Weeks   Status On-going               Plan - 07/15/14 1644    Clinical Impression Statement Written referral received signed for administration of Iontophoresis. Increased flexion and abduction AROM today and no c/o of N/T in RUE.  Pt. appeared to have a decrease in trigger points.  Iontophoresis was applied, but immediately made the patient itchy and was removed.  The pt.'s skin was then cleaned.   Pt. had no pain upon arrival and a 4/10 pain while doing exercises.     PT Next Visit Plan continue manual/STW; continue shoulder strengthening        Problem List Patient Active Problem List   Diagnosis Date Noted  . CAP (community acquired pneumonia) 07/19/2013  . Allergic rhinitis, cause unspecified 07/19/2013  . Dysphagia, pharyngoesophageal phase 04/27/2013  . GERD (gastroesophageal reflux disease) 04/17/2013  . Other screening mammogram 04/17/2013  . Atrophic vaginitis 02/03/2012  . Routine general medical examination at a health care facility 01/24/2012  . Periodic health assessment, Pap and pelvic 01/24/2012  . SMOKER 04/28/2010  . Alcoholic fatty liver 32/91/9166  . GLAUCOMA, BORDERLINE 04/27/2010  . HIATAL HERNIA 04/27/2010  . HYPERLIPIDEMIA 04/16/2010  . Essential hypertension, benign 06/02/2007    Lanise Mergen,[place], SPTA 07/15/2014, 5:10 PM  [facility] [address] [place], [place], [address] Phone: [phone]   Fax:  [phone]

## 2014-07-16 ENCOUNTER — Ambulatory Visit: Payer: 59 | Admitting: Physical Therapy

## 2014-07-16 DIAGNOSIS — S40011S Contusion of right shoulder, sequela: Secondary | ICD-10-CM

## 2014-07-16 DIAGNOSIS — S40011D Contusion of right shoulder, subsequent encounter: Secondary | ICD-10-CM | POA: Diagnosis not present

## 2014-07-16 DIAGNOSIS — M25511 Pain in right shoulder: Secondary | ICD-10-CM

## 2014-07-16 NOTE — Therapy (Signed)
[place], [place], [address] Phone: [phone]   Fax:  [phone]  Physical Therapy Treatment  Patient Details  Name: Paula Parker MRN: [id number] Date of Birth: 1961/01/08 Referring Provider:  Janith Lima, MD  Encounter Date: 07/16/2014      PT End of Session - 07/16/14 1623    Visit Number 5   Number of Visits 12   Date for PT Re-Evaluation 08/08/14   PT Start Time 0086   PT Stop Time 1630   PT Time Calculation (min) 45 min   Activity Tolerance Patient tolerated treatment well      Past Medical History  Diagnosis Date  . Alcoholic fatty liver   . GLAUCOMA, BORDERLINE   . HYPERLIPIDEMIA   . HYPERTENSION   . PALPITATIONS, OCCASIONAL   . SMOKER   . GERD (gastroesophageal reflux disease)   . Pseudomembranous colitis   . Hiatal hernia     Past Surgical History  Procedure Laterality Date  . Cervix lesion destruction  1980'S  . Ovary surgery      removed precancerous cells with a laser    There were no vitals filed for this visit.  Visit Diagnosis:  Shoulder contusion, right, sequela  Pain in joint, shoulder region, right      Subjective Assessment - 07/16/14 1547    Subjective Reports she was too sensitive to tape and ionto patch.  Sore from yesterday.  Arrives from work.  Back to back PT appts.  Reports she still has some painful spots with soft tissue work.     Currently in Pain? No/denies   Pain Location Shoulder   Pain Orientation Right   Pain Type Chronic pain   Pain Frequency Intermittent   Aggravating Factors  reaching up   Pain Relieving Factors arm bike            [facility] PT Assessment - 07/16/14 1551    AROM   Right Shoulder Flexion 133 Degrees   Right Shoulder ABduction 120 Degrees   Right Shoulder Internal Rotation --  T 8   Right Shoulder External Rotation 78 Degrees   Strength   Right Shoulder Flexion 4-/5   Right Shoulder ABduction 4-/5   Right  Shoulder Internal Rotation 4-/5   Right Shoulder External Rotation 4-/5                   [facility] Adult PT Treatment/Exercise - 07/16/14 1605    Shoulder Exercises: Supine   Protraction AROM;Right;10 reps   Other Supine Exercises 1# 12/6:00 and 3:00/9:00 10x each   Shoulder Exercises: Seated   Other Seated Exercises Seated UBE arm bike 5 min FW/BW 2.5 resist   Shoulder Exercises: Prone   Extension Strengthening;Right;10 reps   Horizontal ABduction 1 Right;10 reps   Other Prone Exercises rows 10x right   Shoulder Exercises: Sidelying   External Rotation Right;10 reps   Shoulder Exercises: Standing   External Rotation Right;Theraband;15 reps   Theraband Level (Shoulder External Rotation) Level 1 (Yellow)   Internal Rotation Strengthening;Right;Theraband;15 reps   Theraband Level (Shoulder Internal Rotation) Level 1 (Yellow)   Flexion Strengthening;Right;15 reps   Theraband Level (Shoulder Flexion) Level 1 (Yellow)   Row Strengthening;Right;Theraband;15 reps   Theraband Level (Shoulder Row) Level 1 (Yellow)   Other Standing Exercises Wall push ups 10x   Other Standing Exercises Ball roll on wall to tolerance 10x   Manual Therapy   Manual Therapy Joint mobilization;Scapular mobilization;Myofascial release;Passive ROM  Joint Mobilization GH distraction, inferior, posterior grade 2 at early and mid range right 10x each   Myofascial Release Trigger point release rhomboids   Scapular Mobilization grade 2 ant/post and superior/inferior 10x each   Passive ROM inferior mob with movement 8x                  PT Short Term Goals - 07/16/14 1716    PT SHORT TERM GOAL #1   Title Pt will be I with initial basic  HEP for Rt UE ROM, strength   Time 3   Period Weeks   PT SHORT TERM GOAL #2   Title Patient will report a 25% reduction in pain with household and work duties using right UE   Time 3   Period Weeks   Status On-going   PT SHORT TERM GOAL #3   Title Pt will be  able to reach to 140 deg for work, home tasks with min pain increase   Time 3   Period Weeks   Status Partially Met           PT Long Term Goals - 07/16/14 1717    PT LONG TERM GOAL #1   Title Pt. will be I with principles of RICE, posture and body mechanics for work duties.   Time 6   Period Weeks   Status On-going   PT LONG TERM GOAL #2   Title Patient will have right shoulder flexion to 150 degrees needed for reaching eye level and above for work duties.     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     Time 6   Period Weeks   Status On-going   PT LONG TERM GOAL #4   Title FOTO functional outcome score improved from 43% to 33% indicating improved function with less pain   Time 6   Period Weeks   Status On-going               Plan - 07/16/14 1625    Clinical Impression Statement Improving with shoulder AROM but still limited by pain.  Post treatment pain 2/10.   Trigger points in rhomboid but overall impoved scapular mobliltiy with less sensitivity and tenderness.  Patient unable to tolerate iontophoresis or taping secondary itching.  Recommend continuation of PT for progressive ROM and strengthening in newly gained range.     PT Next Visit Plan continue manual/STW; continue shoulder strengthening and ROM gravity assisted and against gravity as tolerated.          Problem List Patient Active Problem List   Diagnosis Date Noted  . CAP (community acquired pneumonia) 07/19/2013  . Allergic rhinitis, cause unspecified 07/19/2013  . Dysphagia, pharyngoesophageal phase 04/27/2013  . GERD (gastroesophageal reflux disease) 04/17/2013  . Other screening mammogram 04/17/2013  . Atrophic vaginitis 02/03/2012  . Routine general medical examination at a health care facility 01/24/2012  . Periodic health assessment, Pap and pelvic 01/24/2012  .  SMOKER 04/28/2010  . Alcoholic fatty liver 56/38/7564  . GLAUCOMA, BORDERLINE 04/27/2010  . HIATAL HERNIA 04/27/2010  . HYPERLIPIDEMIA 04/16/2010  . Essential hypertension, benign 06/02/2007    Alvera Singh 07/16/2014, 5:18 PM  [facility] [address] [place], [place], [address] Phone: [phone]   Fax:  [phone]   Ruben Im, PT 07/16/2014 5:19  PM Phone: [phone] Fax: [phone]

## 2014-07-23 ENCOUNTER — Encounter: Payer: Self-pay | Admitting: Physical Therapy

## 2014-07-25 ENCOUNTER — Ambulatory Visit: Payer: 59 | Admitting: Physical Therapy

## 2014-07-25 DIAGNOSIS — S40011D Contusion of right shoulder, subsequent encounter: Secondary | ICD-10-CM | POA: Diagnosis not present

## 2014-07-25 DIAGNOSIS — M25511 Pain in right shoulder: Secondary | ICD-10-CM

## 2014-07-25 DIAGNOSIS — S40011S Contusion of right shoulder, sequela: Secondary | ICD-10-CM

## 2014-07-25 NOTE — Patient Instructions (Addendum)
Posture Tips DO: - stand tall and erect - keep chin tucked in - keep head and shoulders in alignment - check posture regularly in mirror or large window - pull head back against headrest in car seat;  Change your position often.  Sit with lumbar support. DON'T: - slouch or slump while watching TV or reading - sit, stand or lie in one position  for too long;  Sitting is especially hard on the spine so if you sit at a desk/use the computer, then stand up often!   Copyright  VHI. All rights reserved.  Posture - Standing   Good posture is important. Avoid slouching and forward head thrust. Maintain curve in low back and align ears over shoul- ders, hips over ankles.  Pull your belly button in toward your back bone.  Sleeping on Back  Place pillow under knees. A pillow with cervical support and a roll around waist are also helpful. Copyright  VHI. All rights reserved.  Sleeping on Side Place pillow between knees. Use cervical support under neck and a roll around waist as needed. Copyright  VHI. All rights reserved.   Sleeping on Stomach   If this is the only desirable sleeping position, place pillow under lower legs, and under stomach or chest as needed.  Posture - Sitting   Sit upright, head facing forward. Try using a roll to support lower back. Keep shoulders relaxed, and avoid rounded back. Keep hips level with knees. Avoid crossing legs for long periods. Stand to Sit / Sit to Stand   To sit: Bend knees to lower self onto front edge of chair, then scoot back on seat. To stand: Reverse sequence by placing one foot forward, and scoot to front of seat. Use rocking motion to stand up.   Work Height and Reach  Ideal work height is no more than 2 to 4 inches below elbow level when standing, and at elbow level when sitting. Reaching should be limited to arm's length, with elbows slightly bent.  Bending  Bend at hips and knees, not back. Keep feet shoulder-width apart.    Posture -  Standing   Good posture is important. Avoid slouching and forward head thrust. Maintain curve in low back and align ears over shoul- ders, hips over ankles.  Alternating Positions   Alternate tasks and change positions frequently to reduce fatigue and muscle tension. Take rest breaks. Computer Work   Position work to Programmer, multimedia. Use proper work and seat height. Keep shoulders back and down, wrists straight, and elbows at right angles. Use chair that provides full back support. Add footrest and lumbar roll as needed.  Getting Into / Out of Car  Lower self onto seat, scoot back, then bring in one leg at a time. Reverse sequence to get out.  Dressing  Lie on back to pull socks or slacks over feet, or sit and bend leg while keeping back straight.    Housework - Sink  Place one foot on ledge of cabinet under sink when standing at sink for prolonged periods.   Pushing / Pulling  Pushing is preferable to pulling. Keep back in proper alignment, and use leg muscles to do the work.  Deep Squat   Squat and lift with both arms held against upper trunk. Tighten stomach muscles without holding breath. Use smooth movements to avoid jerking.  Avoid Twisting   Avoid twisting or bending back. Pivot around using foot movements, and bend at knees if needed when reaching for articles.  Carrying Luggage   Distribute weight evenly on both sides. Use a cart whenever possible. Do not twist trunk. Move body as a unit.   Lifting Principles .Maintain proper posture and head alignment. .Slide object as close as possible before lifting. .Move obstacles out of the way. .Test before lifting; ask for help if too heavy. .Tighten stomach muscles without holding breath. .Use smooth movements; do not jerk. .Use legs to do the work, and pivot with feet. .Distribute the work load symmetrically and close to the center of trunk. .Push instead of pull whenever possible.   Ask For Help   Ask for  help and delegate to others when possible. Coordinate your movements when lifting together, and maintain the low back curve.  Log Roll   Lying on back, bend left knee and place left arm across chest. Roll all in one movement to the right. Reverse to roll to the left. Always move as one unit. Housework - Sweeping  Use long-handled equipment to avoid stooping.   Housework - Wiping  Position yourself as close as possible to reach work surface. Avoid straining your back.  Laundry - Unloading Wash   To unload small items at bottom of washer, lift leg opposite to arm being used to reach.  [place] close to area to be raked. Use arm movements to do the work. Keep back straight and avoid twisting.     Cart  When reaching into cart with one arm, lift opposite leg to keep back straight.   Getting Into / Out of Bed  Lower self to lie down on one side by raising legs and lowering head at the same time. Use arms to assist moving without twisting. Bend both knees to roll onto back if desired. To sit up, start from lying on side, and use same move-ments in reverse. Housework - Vacuuming  Hold the vacuum with arm held at side. Step back and forth to move it, keeping head up. Avoid twisting.   Laundry - IT consultant so that bending and twisting can be avoided.   Laundry - Unloading Dryer  Squat down to reach into clothes dryer or use a reacher.  Gardening - Weeding / Probation officer or Kneel. Knee pads may be helpful.                    Sleeping on Back  Place pillow under knees. A pillow with cervical support and a roll around waist are also helpful. Copyright  VHI. All rights reserved.  Sleeping on Side Place pillow between knees. Use cervical support under neck and a roll around waist as needed. Copyright  VHI. All rights reserved.   Sleeping on Stomach   If this is the only desirable sleeping position, place  pillow under lower legs, and under stomach or chest as needed.  Posture - Sitting   Sit upright, head facing forward. Try using a roll to support lower back. Keep shoulders relaxed, and avoid rounded back. Keep hips level with knees. Avoid crossing legs for long periods. Stand to Sit / Sit to Stand   To sit: Bend knees to lower self onto front edge of chair, then scoot back on seat. To stand: Reverse sequence by placing one foot forward, and scoot to front of seat. Use rocking motion to stand up.   Work Height and Reach  Ideal work height is no more than 2 to 4 inches below elbow level when standing,  and at elbow level when sitting. Reaching should be limited to arm's length, with elbows slightly bent.  Bending  Bend at hips and knees, not back. Keep feet shoulder-width apart.    Posture - Standing   Good posture is important. Avoid slouching and forward head thrust. Maintain curve in low back and align ears over shoul- ders, hips over ankles.  Alternating Positions   Alternate tasks and change positions frequently to reduce fatigue and muscle tension. Take rest breaks. Computer Work   Position work to Programmer, multimedia. Use proper work and seat height. Keep shoulders back and down, wrists straight, and elbows at right angles. Use chair that provides full back support. Add footrest and lumbar roll as needed.  Getting Into / Out of Car  Lower self onto seat, scoot back, then bring in one leg at a time. Reverse sequence to get out.  Dressing  Lie on back to pull socks or slacks over feet, or sit and bend leg while keeping back straight.    Housework - Sink  Place one foot on ledge of cabinet under sink when standing at sink for prolonged periods.   Pushing / Pulling  Pushing is preferable to pulling. Keep back in proper alignment, and use leg muscles to do the work.  Deep Squat   Squat and lift with both arms held against upper trunk. Tighten stomach muscles without  holding breath. Use smooth movements to avoid jerking.  Avoid Twisting   Avoid twisting or bending back. Pivot around using foot movements, and bend at knees if needed when reaching for articles.  Carrying Luggage   Distribute weight evenly on both sides. Use a cart whenever possible. Do not twist trunk. Move body as a unit.   Lifting Principles .Maintain proper posture and head alignment. .Slide object as close as possible before lifting. .Move obstacles out of the way. .Test before lifting; ask for help if too heavy. .Tighten stomach muscles without holding breath. .Use smooth movements; do not jerk. .Use legs to do the work, and pivot with feet. .Distribute the work load symmetrically and close to the center of trunk. .Push instead of pull whenever possible.   Ask For Help   Ask for help and delegate to others when possible. Coordinate your movements when lifting together, and maintain the low back curve.  Log Roll   Lying on back, bend left knee and place left arm across chest. Roll all in one movement to the right. Reverse to roll to the left. Always move as one unit. Housework - Sweeping  Use long-handled equipment to avoid stooping.   Housework - Wiping  Position yourself as close as possible to reach work surface. Avoid straining your back.  Laundry - Unloading Wash   To unload small items at bottom of washer, lift leg opposite to arm being used to reach.  [place] close to area to be raked. Use arm movements to do the work. Keep back straight and avoid twisting.     Cart  When reaching into cart with one arm, lift opposite leg to keep back straight.   Getting Into / Out of Bed  Lower self to lie down on one side by raising legs and lowering head at the same time. Use arms to assist moving without twisting. Bend both knees to roll onto back if desired. To sit up, start from lying on side, and use same move-ments in reverse. Housework  - Vacuuming  Hold the vacuum with  arm held at side. Step back and forth to move it, keeping head up. Avoid twisting.   Laundry - IT consultant so that bending and twisting can be avoided.   Laundry - Unloading Dryer  Squat down to reach into clothes dryer or use a reacher.  Gardening - Weeding / Probation officer or Kneel. Knee pads may be helpful.                  Copyright  VHI. All rights reserved.  Posture - Sitting   Sit upright, head facing forward. Try using a roll to support lower back. Keep shoulders relaxed, and avoid rounded back. Keep hips level with knees. Avoid crossing legs for long periods.   Copyright  VHI. All rights reserved.   and body mechanics

## 2014-07-25 NOTE — Therapy (Signed)
[place], [place], [address] Phone: [phone]   Fax:  [phone]  Physical Therapy Treatment  Patient Details  Name: Paula Parker MRN: [id number] Date of Birth: [birth year] Referring Provider:  Janith Lima, MD  Encounter Date: 07/25/2014      PT End of Session - 07/25/14 1703    Visit Number 6   Number of Visits 12   Date for PT Re-Evaluation 08/08/14   PT Start Time 1550   PT Stop Time 1626   PT Time Calculation (min) 36 min   Activity Tolerance Patient limited by pain      Past Medical History  Diagnosis Date  . Alcoholic fatty liver   . GLAUCOMA, BORDERLINE   . HYPERLIPIDEMIA   . HYPERTENSION   . PALPITATIONS, OCCASIONAL   . SMOKER   . GERD (gastroesophageal reflux disease)   . Pseudomembranous colitis   . Hiatal hernia     Past Surgical History  Procedure Laterality Date  . Cervix lesion destruction  1980'S  . Ovary surgery      removed precancerous cells with a laser    There were no vitals filed for this visit.  Visit Diagnosis:  Shoulder contusion, right, sequela  Pain in joint, shoulder region, right      Subjective Assessment - 07/25/14 1552    Subjective 1/10 now up to 5/10 .  Pain improving.  tries to work on posture.   Pain Score 1   up to 5/10   Pain Location Shoulder   Pain Orientation Right   Pain Descriptors / Indicators --  hurts   Aggravating Factors  moving arm cleaning chairs.  Worse as the day goes on.   Pain Relieving Factors arm bike   Effect of Pain on Daily Activities needs to switch arms. Light duty   Multiple Pain Sites No                         [facility] Adult PT Treatment/Exercise - 07/25/14 1557    Posture/Postural Control   Posture/Postural Control Postural limitations   Posture Comments can correct posture sitting with cues.  ADL handout issued and reviewed some techniques.  Suggestions made for work activity  modifications.   Shoulder Exercises: Seated   Elevation --   shrug   Retraction Weight (lbs) 10 reps  5 seconds   Other Seated Exercises Humeral depression 5 reps (elevation AA)  5 reps depressionthen relax shoulder to decrease pain.     Shoulder Exercises: Pulleys   Flexion 2 minutes   Shoulder Exercises: ROM/Strengthening   UBE (Upper Arm Bike) --  Forward only due to reverse a little painful Level 1                PT Education - 07/25/14 1702    Education Details posture education for AdL's, also for work tasks modifications.   Person(s) Educated Patient   Methods Explanation;Demonstration;Handout;Tactile cues   Comprehension Verbalized understanding;Returned demonstration;Need further instruction          PT Short Term Goals - 07/25/14 1715    PT SHORT TERM GOAL #1   Title Pt will be I with initial basic  HEP for Rt UE ROM, strength   Time 3   Period Weeks   Status Achieved   PT SHORT TERM GOAL #2   Title Patient will report a 25% reduction in pain with household and work duties using right UE  Time 3   Period Weeks   Status On-going   PT SHORT TERM GOAL #3   Title Pt will be able to reach to 140 deg for work, home tasks with min pain increase   Baseline 120 degrees AROM Flexion   Time 3   Period Weeks   Status Partially Met           PT Long Term Goals - 07/25/14 1716    PT LONG TERM GOAL #1   Title Pt. will be I with principles of RICE, posture and body mechanics for work duties.   Baseline introduced principles today   Time 6   Period Weeks   Status On-going   PT LONG TERM GOAL #2   Title Patient will have right shoulder flexion to 150 degrees needed for reaching eye level and above for work duties.     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     Time 6   Period Weeks   Status On-going   PT LONG TERM GOAL #4    Title FOTO functional outcome score improved from 43% to 33% indicating improved function with less pain   Time 6   Period Weeks   Status Unable to assess               Plan - 07/25/14 1704    Clinical Impression Statement 120 flexion active RT shoulder.  Patient not eager to exercise due to pain and fatigue so most of session spent on self care for pain control, discussion of different modalities and how they help pain,   and ADL posture education, progress made toward ROM and posture goals. New order from MD to continue PT for AROM/ Strength, [place] AC joint.Patient  is making choices about her care that limit options of what we can do to help.   PT Next Visit Plan Patient willing to consider [place].  She declined all tape and modalities today.  Posture education for ALD to continue,  strengthen as able.   Consulted and Agree with Plan of Care Patient        Problem List Patient Active Problem List   Diagnosis Date Noted  . CAP (community acquired pneumonia) 07/19/2013  . Allergic rhinitis, cause unspecified 07/19/2013  . Dysphagia, pharyngoesophageal phase 04/27/2013  . GERD (gastroesophageal reflux disease) 04/17/2013  . Other screening mammogram 04/17/2013  . Atrophic vaginitis 02/03/2012  . Routine general medical examination at a health care facility 01/24/2012  . Periodic health assessment, Pap and pelvic 01/24/2012  . SMOKER 04/28/2010  . Alcoholic fatty liver 74/94/4967  . GLAUCOMA, BORDERLINE 04/27/2010  . HIATAL HERNIA 04/27/2010  . HYPERLIPIDEMIA 04/16/2010  . Essential hypertension, benign 06/02/2007    [facility] 07/25/2014, 5:18 PM  [facility] [address] [place], [place], [address] Phone: [phone]   Fax:  [phone]   Melvenia Needles, PTA 07/25/2014 5:18 PM Phone: [phone] Fax: [phone]

## 2014-07-30 ENCOUNTER — Encounter: Payer: Self-pay | Admitting: Physical Therapy

## 2014-08-01 ENCOUNTER — Ambulatory Visit: Payer: 59 | Admitting: Physical Therapy

## 2014-08-06 ENCOUNTER — Ambulatory Visit: Payer: 59 | Attending: Orthopedic Surgery | Admitting: Physical Therapy

## 2014-08-06 DIAGNOSIS — S40011S Contusion of right shoulder, sequela: Secondary | ICD-10-CM | POA: Insufficient documentation

## 2014-08-06 DIAGNOSIS — M25511 Pain in right shoulder: Secondary | ICD-10-CM | POA: Insufficient documentation

## 2014-08-06 NOTE — Therapy (Signed)
[place], [place], [address] Phone: [phone]   Fax:  [phone]  Physical Therapy Treatment  Patient Details  Name: Paula Parker MRN: [id number] Date of Birth: [birth year] Referring Provider:  Janith Lima, MD  Encounter Date: 08/06/2014      PT End of Session - 08/06/14 1620    Visit Number 7   Number of Visits 12   Date for PT Re-Evaluation 08/08/14   Authorization Type W/C   Authorization - Number of Visits 12   PT Start Time 1937   PT Stop Time 1625   PT Time Calculation (min) 40 min   Activity Tolerance Patient limited by pain      Past Medical History  Diagnosis Date  . Alcoholic fatty liver   . GLAUCOMA, BORDERLINE   . HYPERLIPIDEMIA   . HYPERTENSION   . PALPITATIONS, OCCASIONAL   . SMOKER   . GERD (gastroesophageal reflux disease)   . Pseudomembranous colitis   . Hiatal hernia     Past Surgical History  Procedure Laterality Date  . Cervix lesion destruction  1980'S  . Ovary surgery      removed precancerous cells with a laser    There were no vitals filed for this visit.  Visit Diagnosis:  Shoulder contusion, right, sequela  Pain in joint, shoulder region, right      Subjective Assessment - 08/06/14 1550    Subjective It's doing a lot better.  It finally woke up.  Trying to use her arm at work a little.   Currently in Pain? No/denies   Pain Score 0-No pain   Pain Location Shoulder   Pain Orientation Right   Pain Type Chronic pain   Aggravating Factors  bending over to cleaning chairs; bending over in general    Pain Relieving Factors golfer's lift method                         [facility] Adult PT Treatment/Exercise - 08/06/14 1558    Exercises   Exercises Shoulder   Shoulder Exercises: Supine   Protraction --  Nu-Step 6 min L5   Shoulder Exercises: Seated   Elevation Right  Body Blade at neutral and 90 degrees 1 minute each   Extension  Strengthening;Right;15 reps;Theraband   Theraband Level (Shoulder Extension) Level 2 (Red)   Flexion AAROM;Right;15 reps  UE Ranger   Other Seated Exercises --   Other Seated Exercises --   Shoulder Exercises: Standing   Theraband Level (Shoulder Horizontal ABduction) Level 2 (Red)  D1/D2 Extension 15x each    Internal Rotation AAROM;Both;10 reps   Flexion Right;AAROM;15 reps  UE ranger Level 12   Extension Strengthening;Right;15 reps;Theraband   Theraband Level (Shoulder Extension) Level 2 (Red)   Retraction --  Body Blade neutral and 90 degrees 1 minute each   Other Standing Exercises Wall push ups 10x  right only   Other Standing Exercises UE Ranger level 12 15x;                   PT Short Term Goals - 08/06/14 1631    PT SHORT TERM GOAL #1   Title Pt will be I with initial basic  HEP for Rt UE ROM, strength   Status Achieved   PT SHORT TERM GOAL #2   Title Patient will report a 25% reduction in pain with household and work duties using right UE   Time 3  Period Weeks   Status On-going   PT SHORT TERM GOAL #3   Title Pt will be able to reach to 140 deg for work, home tasks with min pain increase   Time 3   Period Weeks   Status Partially Met           PT Long Term Goals - 08/06/14 1631    PT LONG TERM GOAL #1   Title Pt. will be I with principles of RICE, posture and body mechanics for work duties.   Time 6   Period Weeks   Status On-going   PT LONG TERM GOAL #2   Title Patient will have right shoulder flexion to 150 degrees needed for reaching eye level and above for work duties.     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     Time 6   Period Weeks   Status On-going   PT LONG TERM GOAL #4   Title FOTO functional outcome score improved from 43% to 33% indicating improved function with less pain   Time 6   Period Weeks    Status On-going               Plan - 08/06/14 1622    Clinical Impression Statement Post treatment pain 2/10.  Primary discomfort with behind the back IR.  Patient with overall improved pain with right shoulder AROM.  She declines the need for modalities today.  Therapist providing verbal and tactile cues to decrease compensatory shoulder shrug and monitoring pain/modifying as needed.    PT Next Visit Plan Recheck AROM next visit with recertification;  do FOTO;  [place] visits cover 12; check progress toward goals;  UE Ranger for overhead;  wall slides with right UE        Problem List Patient Active Problem List   Diagnosis Date Noted  . CAP (community acquired pneumonia) 07/19/2013  . Allergic rhinitis, cause unspecified 07/19/2013  . Dysphagia, pharyngoesophageal phase 04/27/2013  . GERD (gastroesophageal reflux disease) 04/17/2013  . Other screening mammogram 04/17/2013  . Atrophic vaginitis 02/03/2012  . Routine general medical examination at a health care facility 01/24/2012  . Periodic health assessment, Pap and pelvic 01/24/2012  . SMOKER 04/28/2010  . Alcoholic fatty liver 89/21/1941  . GLAUCOMA, BORDERLINE 04/27/2010  . HIATAL HERNIA 04/27/2010  . HYPERLIPIDEMIA 04/16/2010  . Essential hypertension, benign 06/02/2007    Alvera Singh 08/06/2014, 4:34 PM  [facility] [address] [place], [place], [address] Phone: [phone]   Fax:  [phone]   Ruben Im, PT 08/06/2014 4:34 PM Phone: [phone] Fax: [phone]

## 2014-08-08 ENCOUNTER — Ambulatory Visit: Payer: 59 | Admitting: Physical Therapy

## 2014-08-08 DIAGNOSIS — S40011S Contusion of right shoulder, sequela: Secondary | ICD-10-CM

## 2014-08-08 DIAGNOSIS — M25511 Pain in right shoulder: Secondary | ICD-10-CM

## 2014-08-09 ENCOUNTER — Encounter: Payer: Self-pay | Admitting: Internal Medicine

## 2014-08-09 ENCOUNTER — Ambulatory Visit (INDEPENDENT_AMBULATORY_CARE_PROVIDER_SITE_OTHER): Payer: 59 | Admitting: Internal Medicine

## 2014-08-09 VITALS — BP 136/88 | HR 70 | Temp 98.6°F | Resp 18 | Ht 60.0 in | Wt 149.0 lb

## 2014-08-09 DIAGNOSIS — J309 Allergic rhinitis, unspecified: Secondary | ICD-10-CM

## 2014-08-09 DIAGNOSIS — I10 Essential (primary) hypertension: Secondary | ICD-10-CM

## 2014-08-09 MED ORDER — CETIRIZINE HCL 10 MG PO TABS: 10.0000 mg | ORAL_TABLET | Freq: Every day | ORAL | Status: DC

## 2014-08-09 MED ORDER — TRIAMCINOLONE ACETONIDE 55 MCG/ACT NA AERO: 2.0000 | INHALATION_SPRAY | Freq: Every day | NASAL | Status: DC

## 2014-08-09 NOTE — Progress Notes (Signed)
Pre visit review using our clinic review tool, if applicable. No additional management support is needed unless otherwise documented below in the visit note. 

## 2014-08-09 NOTE — Progress Notes (Signed)
Subjective:    Patient ID: Paula Parker, female    DOB: May 13, 1960, 54 y.o.   MRN: [id number]  HPI  Does have several wks ongoing nasal allergy symptoms with clearish congestion, itch and sneezing, without fever, pain, ST, cough, swelling or wheezing.  Pt denies chest pain, increased sob or doe, wheezing, orthopnea, PND, increased LE swelling, palpitations, dizziness or syncope.  Pt denies new neurological symptoms such as new headache, or facial or extremity weakness or numbness   Pt denies polydipsia, polyuria, Past Medical History  Diagnosis Date  . Alcoholic fatty liver   . GLAUCOMA, BORDERLINE   . HYPERLIPIDEMIA   . HYPERTENSION   . PALPITATIONS, OCCASIONAL   . SMOKER   . GERD (gastroesophageal reflux disease)   . Pseudomembranous colitis   . Hiatal hernia    Past Surgical History  Procedure Laterality Date  . Cervix lesion destruction  1980'S  . Ovary surgery      removed precancerous cells with a laser    reports that she quit smoking about 3 months ago. Her smoking use included Cigarettes. She smoked 1.00 pack per day. She has never used smokeless tobacco. She reports that she drinks alcohol. She reports that she does not use illicit drugs. family history includes ALS in her mother; Coronary artery disease in her other; Diabetes in her maternal grandfather; Early death in her sister; Heart disease in her sister; Ovarian cancer (age of onset: 61) in her sister. There is no history of Stroke, Kidney disease, Hypertension, or Hyperlipidemia. Allergies  Allergen Reactions  . Doxycycline Other (See Comments)    Throat swelling  . Doxycycline   . Penicillins   . Sulfa Antibiotics   . Tessalon [Benzonatate]   . Benzonatate Rash and Other (See Comments)    unknown  . Penicillins Rash  . Sulfonamide Derivatives Rash and Other (See Comments)    unknown   Current Outpatient Prescriptions on File Prior to Visit  Medication Sig Dispense Refill  . dextromethorphan (DELSYM) 30  MG/5ML liquid Take 10 mLs (60 mg total) by mouth 2 (two) times daily. (Patient not taking: Reported on 06/27/2014) 89 mL 0  . omeprazole (PRILOSEC) 20 MG capsule Take 1 capsule by mouth every morning 30 minutes before breakfast (Patient not taking: Reported on 06/27/2014) 30 capsule 11  . Throat Lozenges (LOZENGES MT) Use as directed 1 lozenge in the mouth or throat every hour as needed (coughing).     No current facility-administered medications on file prior to visit.   Review of Systems All otherwise neg per pt     Objective:   Physical Exam BP 136/88 mmHg  Pulse 70  Temp(Src) 98.6 F (37 C) (Oral)  Resp 18  Ht 5' (1.524 m)  Wt 149 lb (67.586 kg)  BMI 29.10 kg/m2  SpO2 98% VS noted, not ill appaering Constitutional: Pt appears in no significant distress HENT: Head: NCAT.  Right Ear: External ear normal.  Left Ear: External ear normal.  Bilat tm's with mild erythema.  Max sinus areas mild tender.  Pharynx with mild erythema, no exudate Eyes: . Pupils are equal, round, and reactive to light. Conjunctivae and EOM are normal Neck: Normal range of motion. Neck supple.  Cardiovascular: Normal rate and regular rhythm.   Pulmonary/Chest: Effort normal and breath sounds without rales or wheezing.  Neurological: Pt is alert. Not confused , motor grossly intact Skin: Skin is warm. No rash, no LE edema Psychiatric: Pt behavior is normal. No agitation.  Assessment & Plan:

## 2014-08-09 NOTE — Patient Instructions (Signed)
Please take all new medication as prescribed - the zyrtec and nasacort  You can also take Mucinex (or it's generic off brand) for congestion, and tylenol as needed for pain.  Please continue all other medications as before, and refills have been done if requested.  Please have the pharmacy call with any other refills you may need.  Please continue your efforts at being more active, low cholesterol diet, and weight control.  Please keep your appointments with your specialists as you may have planned

## 2014-08-09 NOTE — Therapy (Signed)
[place], [place], [address] Phone: [phone]   Fax:  [phone]                                                       Physical Therapy Treatment/Recertification Patient Details  Name: Paula Parker MRN: [id number] Date of Birth: 19-Aug-1960 Referring Provider:  Janith Lima, MD  Encounter Date: 08/08/2014      PT End of Session - 08/09/14 0711    Visit Number 8   Number of Visits 12   Date for PT Re-Evaluation 09/13/14   Authorization Type W/C   PT Start Time 1545   PT Stop Time 1630   PT Time Calculation (min) 45 min   Activity Tolerance Patient tolerated treatment well;Patient limited by pain      Past Medical History  Diagnosis Date  . Alcoholic fatty liver   . GLAUCOMA, BORDERLINE   . HYPERLIPIDEMIA   . HYPERTENSION   . PALPITATIONS, OCCASIONAL   . SMOKER   . GERD (gastroesophageal reflux disease)   . Pseudomembranous colitis   . Hiatal hernia     Past Surgical History  Procedure Laterality Date  . Cervix lesion destruction  1980'S  . Ovary surgery      removed precancerous cells with a laser    There were no vitals filed for this visit.  Visit Diagnosis:  Shoulder contusion, right, sequela - Plan: PT plan of care cert/re-cert  Pain in joint, shoulder region, right - Plan: PT plan of care cert/re-cert      Subjective Assessment - 08/08/14 1553    Subjective 0/10 pain at present because " I didn't do any chairs today."  States she was sore from treatment session on Tuesday.   Currently in Pain? No/denies   Pain Score 0-No pain   Pain Location Shoulder   Pain Orientation Right   Aggravating Factors  cleaning chairs at work   Pain Relieving Factors golfer's lift method            [facility] PT Assessment - 08/08/14 1556    Observation/Other Assessments   Focus on Therapeutic Outcomes (FOTO)  complete 41%   ROM / Strength   AROM / PROM / Strength Strength   AROM   Right  Shoulder Flexion 136 Degrees   Right Shoulder ABduction 145 Degrees   Right Shoulder Internal Rotation --  T10   Right Shoulder External Rotation 78 Degrees   Strength   Right Shoulder Flexion 4-/5   Right Shoulder ABduction 4-/5   Right Shoulder Internal Rotation 4/5   Right Shoulder External Rotation 4/5                     [facility] Adult PT Treatment/Exercise - 08/08/14 1608    Shoulder Exercises: Supine   Flexion AROM;Right;10 reps  12:00/6:00 and 9:00/3:00 1#   Other Supine Exercises flexion stretch 1# 30 sec 3x   Shoulder Exercises: Seated   Other Seated Exercises Nu-Step L4 UEs/LEs 5 min   Shoulder Exercises: Sidelying   Other Sidelying Exercises 1# 12:00/6:00, 9:00/3:00   Shoulder Exercises: Standing   Horizontal ABduction Right;15 reps;Weights   Horizontal ABduction Weight (lbs) 1  bent position   Extension Right;15 reps;Weights   Extension Weight (lbs) 1  bent position   Row 15  reps;Right;Weights   Row Weight (lbs) 1  bent position   Other Standing Exercises Yellow band right scaption on wall 15x   Other Standing Exercises UE Ranger Level 16 FW and scaption 15x each                  PT Short Term Goals - 08/09/14 0718    PT SHORT TERM GOAL #1   Title Pt will be I with initial basic  HEP for Rt UE ROM, strength   Status Achieved   PT SHORT TERM GOAL #2   Title Patient will report a 25% reduction in pain with household and work duties using right UE   Status Achieved   PT SHORT TERM GOAL #3   Title Pt will be able to reach to 140 deg for work, home tasks with min pain increase   Status Achieved           PT Long Term Goals - 08/09/14 0718    PT LONG TERM GOAL #1   Title Pt. will be I with principles of RICE, posture and body mechanics for work duties.   Status Achieved   PT LONG TERM GOAL #2   Title Patient will have right shoulder flexion to 150 degrees needed for reaching eye level and above for work duties.     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     Time 6   Period Weeks   Status Revised   PT LONG TERM GOAL #4   Title FOTO functional outcome score improved from 43% to 33% indicating improved function with less pain   Time 6   Period Weeks   Status On-going               Plan - 08/09/14 4536    Clinical Impression Statement AROM for flexion and abduction slowly improving:  136 flex, abd 145 degrees against gravity.  FOTO functional outcome score with improvement to 41%.  Therapist closely monitoring pain and modifying as needed.  She declines the need for all modalities and taping.  Recommend 4 more visits at reduced frequency to promote independence with HEP and for further strengthening and ROM with overhead as needed for full work duties.     PT Next Visit Plan 4 more visits;  UE Ranger for overhead; continue with increasing shoulder flexion/scaption needed for work duties      Patient declines iontophoresis secondary to immediate itching.    Problem List Patient Active Problem List   Diagnosis Date Noted  . CAP (community acquired pneumonia) 07/19/2013  . Allergic rhinitis, cause unspecified 07/19/2013  . Dysphagia, pharyngoesophageal phase 04/27/2013  . GERD (gastroesophageal reflux disease) 04/17/2013  . Other screening mammogram 04/17/2013  . Atrophic vaginitis 02/03/2012  . Routine general medical examination at a health care facility 01/24/2012  . Periodic health assessment, Pap and pelvic 01/24/2012  . SMOKER 04/28/2010  . Alcoholic fatty liver 46/80/3212  . GLAUCOMA, BORDERLINE 04/27/2010  . HIATAL HERNIA 04/27/2010  . HYPERLIPIDEMIA 04/16/2010  . Essential hypertension, benign 06/02/2007    Alvera Singh 08/09/2014, 7:23 AM  [facility] [address] [place], [place], [address] Phone:  [phone]   Fax:  [phone]   Ruben Im, PT 08/09/2014 7:24 AM Phone: [phone] Fax: [phone]

## 2014-08-10 NOTE — Assessment & Plan Note (Signed)
stable overall by history and exam, recent data reviewed with pt, and pt to continue medical treatment as before,  to f/u any worsening symptoms or concerns BP Readings from Last 3 Encounters:  08/09/14 136/88  05/13/14 138/86  04/25/14 130/78

## 2014-08-10 NOTE — Assessment & Plan Note (Signed)
Mild to mod, for zyrtec/nasacort asd,  to f/u any worsening symptoms or concerns

## 2014-08-22 ENCOUNTER — Ambulatory Visit: Payer: 59 | Admitting: Physical Therapy

## 2014-08-22 DIAGNOSIS — M25511 Pain in right shoulder: Secondary | ICD-10-CM

## 2014-08-22 DIAGNOSIS — S40011S Contusion of right shoulder, sequela: Secondary | ICD-10-CM

## 2014-08-22 NOTE — Therapy (Signed)
[place], [place], [address] Phone: [phone]   Fax:  [phone]  Physical Therapy Treatment  Patient Details  Name: Paula Parker MRN: [id number] Date of Birth: 09-23-1960 Referring Provider:  Janith Lima, MD  Encounter Date: 08/22/2014      PT End of Session - 08/22/14 1526    Visit Number 9   Number of Visits 12   Date for PT Re-Evaluation 09/13/14   Authorization Type W/C   Authorization - Number of Visits 12   PT Start Time 1500   PT Stop Time 1545   PT Time Calculation (min) 45 min   Activity Tolerance Patient tolerated treatment well      Past Medical History  Diagnosis Date  . Alcoholic fatty liver   . GLAUCOMA, BORDERLINE   . HYPERLIPIDEMIA   . HYPERTENSION   . PALPITATIONS, OCCASIONAL   . SMOKER   . GERD (gastroesophageal reflux disease)   . Pseudomembranous colitis   . Hiatal hernia     Past Surgical History  Procedure Laterality Date  . Cervix lesion destruction  1980'S  . Ovary surgery      removed precancerous cells with a laser    There were no vitals filed for this visit.  Visit Diagnosis:  Shoulder contusion, right, sequela  Pain in joint, shoulder region, right      Subjective Assessment - 08/22/14 1502    Subjective Going to the doctor's today.  No pain at the moment.  Had some pain earlier this morning reaching her arm back.  It's getting better though.  Overall 75% improvement in pain and function.     Currently in Pain? No/denies   Pain Location Shoulder   Pain Orientation Right   Pain Type Chronic pain   Pain Onset More than a month ago   Pain Frequency Intermittent   Aggravating Factors  taking her arm back   Pain Relieving Factors exercises            [facility] PT Assessment - 08/22/14 1509    AROM   Right Shoulder Extension 30 Degrees   Right Shoulder Flexion 147 Degrees   Right Shoulder ABduction 165 Degrees   Right Shoulder Internal  Rotation --  behind back to T10 but painful   Right Shoulder External Rotation 75 Degrees   Strength   Right Shoulder Flexion 4/5   Right Shoulder ABduction 4/5   Right Shoulder Internal Rotation 4/5   Right Shoulder External Rotation 4/5   Hawkins-Kennedy test   Findings Negative   Side Right   Empty Can test   Findings Negative   Side Right   Lag time at 0 degrees   Findings Negative   Drop Arm test   Findings Negative                     [facility] Adult PT Treatment/Exercise - 08/22/14 1505    Shoulder Exercises: Supine   Flexion AROM;Right;10 reps  3:00/9:00 10x   Shoulder Flexion Weight (lbs) 2   Shoulder Exercises: Seated   Other Seated Exercises Nu-Step L4 UEs/LEs 5 min   Shoulder Exercises: Prone   Extension Strengthening;Right;10 reps;15 reps   Horizontal ABduction 1 Strengthening;Right;15 reps;Weights   Horizontal ABduction 1 Weight (lbs) 2   Other Prone Exercises rows 2# 15x   Shoulder Exercises: Sidelying   External Rotation Right;10 reps;Strengthening;Weights   External Rotation Weight (lbs) 2   Flexion AROM;Right   ABduction AROM;Right;10 reps  Shoulder Exercises: Standing   Other Standing Exercises Yellow band right scaption on wall 15x   Other Standing Exercises UE Ranger Level 16 FW and scaption 15x each   Manual Therapy   Joint Mobilization GH distraction, inferior, posterior grade 3 at early, mid range and endrange right 10x each   Passive ROM inferior mob with movement 8x                  PT Short Term Goals - 08/22/14 1506    PT SHORT TERM GOAL #1   Title Pt will be I with initial basic  HEP for Rt UE ROM, strength   Status Achieved   PT SHORT TERM GOAL #2   Title Patient will report a 25% reduction in pain with household and work duties using right UE   Status Achieved   PT SHORT TERM GOAL #3   Title Pt will be able to reach to 140 deg for work, home tasks with min pain increase   Status Achieved           PT Long  Term Goals - 08/22/14 1507    PT LONG TERM GOAL #1   Title Pt. will be I with principles of RICE, posture and body mechanics for work duties.   Status Achieved   PT LONG TERM GOAL #2   Title Patient will have right shoulder flexion to 150 degrees needed for reaching eye level and above for work duties.     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     Time 6   Period --   Status --   PT LONG TERM GOAL #4   Title FOTO functional outcome score improved from 43% to 33% indicating improved function with less pain   Time 6   Period --   Status --               Plan - 08/22/14 1528    Clinical Impression Statement Patient progressing well with shoulder AROM:  flexion 147, abd 165, ER 75, IR behind back to T10.  New Home and scapular strength grossly 4/5.  Patient reports overall pain and improvement at 75%.  Progressing toward goals and should meet remaining in next 3 visits.  Therapist monitoring for pain and verbal cues  for scapular setting.     PT Next Visit Plan 3 more visits with focus on overhead ROM and GH/scapular strengthening.          Problem List Patient Active Problem List   Diagnosis Date Noted  . CAP (community acquired pneumonia) 07/19/2013  . Allergic rhinitis 07/19/2013  . Dysphagia, pharyngoesophageal phase 04/27/2013  . GERD (gastroesophageal reflux disease) 04/17/2013  . Other screening mammogram 04/17/2013  . Atrophic vaginitis 02/03/2012  . Routine general medical examination at a health care facility 01/24/2012  . Periodic health assessment, Pap and pelvic 01/24/2012  . SMOKER 04/28/2010  . Alcoholic fatty liver 25/36/6440  . GLAUCOMA, BORDERLINE 04/27/2010  . HIATAL HERNIA 04/27/2010  . HYPERLIPIDEMIA 04/16/2010  . Essential hypertension, benign 06/02/2007    Alvera Singh 08/22/2014, 3:38 PM  [facility] [address] [place], [place], [address] Phone: [phone]   Fax:  [phone]   Ruben Im, PT 08/22/2014 3:39 PM Phone: [phone] Fax: [phone]

## 2014-08-29 ENCOUNTER — Ambulatory Visit: Payer: 59 | Admitting: Physical Therapy

## 2014-08-29 DIAGNOSIS — S40011S Contusion of right shoulder, sequela: Secondary | ICD-10-CM

## 2014-08-29 DIAGNOSIS — M25511 Pain in right shoulder: Secondary | ICD-10-CM

## 2014-08-29 NOTE — Therapy (Signed)
[place], [place], [address] Phone: [phone]   Fax:  [phone]  Physical Therapy Treatment  Patient Details  Name: Paula Parker MRN: [id number] Date of Birth: 1960-04-27 Referring Provider:  Janith Lima, MD  Encounter Date: 08/29/2014      PT End of Session - 08/29/14 1629    Visit Number 10   Number of Visits 12   Authorization Type W/C   Authorization - Number of Visits 12   PT Start Time 0423   PT Stop Time 8250   PT Time Calculation (min) 34 min      Past Medical History  Diagnosis Date  . Alcoholic fatty liver   . GLAUCOMA, BORDERLINE   . HYPERLIPIDEMIA   . HYPERTENSION   . PALPITATIONS, OCCASIONAL   . SMOKER   . GERD (gastroesophageal reflux disease)   . Pseudomembranous colitis   . Hiatal hernia     Past Surgical History  Procedure Laterality Date  . Cervix lesion destruction  1980'S  . Ovary surgery      removed precancerous cells with a laser    There were no vitals filed for this visit.  Visit Diagnosis:  No diagnosis found.      Subjective Assessment - 08/29/14 1627    Subjective MD agreed to finishing my 3 viisits here and transitioning to HEP   Currently in Pain? No/denies                         [facility] Adult PT Treatment/Exercise - 08/29/14 1630    Shoulder Exercises: Supine   Protraction Strengthening;Right;20 reps;Weights   Protraction Weight (lbs) 2   Flexion AROM;Right;20 reps  12:00/6:00 and 9:00/3:00 2#   Shoulder Flexion Weight (lbs) 2   Other Supine Exercises supine flexion 1#x 20   Other Supine Exercises supine cane pullovers 2# 20   Shoulder Exercises: Prone   Extension Strengthening;Right;20 reps;Weights   Extension Weight (lbs) 2   External Rotation Strengthening;Right;10 reps;20 reps;Weights   External Rotation Weight (lbs) 2   Internal Rotation Strengthening;Right;10 reps   Internal Rotation Weight (lbs) 2   Internal  Rotation Limitations less rom with resistance   Horizontal ABduction 1 Strengthening;Right;20 reps;Weights   Horizontal ABduction 1 Weight (lbs) 2   Other Prone Exercises rows 2# 20x   Shoulder Exercises: Sidelying   External Rotation Strengthening;Right;20 reps   External Rotation Weight (lbs) 2   Shoulder Exercises: Standing   Other Standing Exercises Yellow band right scaption on wall 10x  fatigue at end of session   Other Standing Exercises UE Ranger Level 16 FW and scaption 20x each                  PT Short Term Goals - 08/22/14 1506    PT SHORT TERM GOAL #1   Title Pt will be I with initial basic  HEP for Rt UE ROM, strength   Status Achieved   PT SHORT TERM GOAL #2   Title Patient will report a 25% reduction in pain with household and work duties using right UE   Status Achieved   PT SHORT TERM GOAL #3   Title Pt will be able to reach to 140 deg for work, home tasks with min pain increase   Status Achieved           PT Long Term Goals - 08/22/14 1507    PT LONG TERM GOAL #1   Title  Pt. will be I with principles of RICE, posture and body mechanics for work duties.   Status Achieved   PT LONG TERM GOAL #2   Title Patient will have right shoulder flexion to 150 degrees needed for reaching eye level and above for work duties.     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     Time 6   Period --   Status --   PT LONG TERM GOAL #4   Title FOTO functional outcome score improved from 43% to 33% indicating improved function with less pain   Time 6   Period --   Status --               Plan - 08/29/14 1658    Clinical Impression Statement Focus today on strengthening RTC and periscap. Pt performed increased reps/resistance with fatigue , no pain.    PT Next Visit Plan 2 more visits with focus on overhead ROM and  GH/scapular strengthening.          Problem List Patient Active Problem List   Diagnosis Date Noted  . CAP (community acquired pneumonia) 07/19/2013  . Allergic rhinitis 07/19/2013  . Dysphagia, pharyngoesophageal phase 04/27/2013  . GERD (gastroesophageal reflux disease) 04/17/2013  . Other screening mammogram 04/17/2013  . Atrophic vaginitis 02/03/2012  . Routine general medical examination at a health care facility 01/24/2012  . Periodic health assessment, Pap and pelvic 01/24/2012  . SMOKER 04/28/2010  . Alcoholic fatty liver 07/62/2633  . GLAUCOMA, BORDERLINE 04/27/2010  . HIATAL HERNIA 04/27/2010  . HYPERLIPIDEMIA 04/16/2010  . Essential hypertension, benign 06/02/2007    Dorene Ar, PTA 08/29/2014, 5:00 PM  [place], [place], [address] Phone: [phone]   Fax:  [phone]

## 2014-09-05 ENCOUNTER — Ambulatory Visit: Payer: 59 | Admitting: Physical Therapy

## 2014-09-12 ENCOUNTER — Ambulatory Visit: Payer: 59 | Attending: Orthopedic Surgery | Admitting: Physical Therapy

## 2014-09-12 DIAGNOSIS — M25511 Pain in right shoulder: Secondary | ICD-10-CM | POA: Insufficient documentation

## 2014-09-12 DIAGNOSIS — S40011S Contusion of right shoulder, sequela: Secondary | ICD-10-CM | POA: Insufficient documentation

## 2014-09-12 DIAGNOSIS — X58XXXS Exposure to other specified factors, sequela: Secondary | ICD-10-CM | POA: Diagnosis not present

## 2014-09-12 NOTE — Therapy (Addendum)
[place], [place], [address] Phone: [phone]   Fax:  [phone]  Physical Therapy Treatment/Discharge Summary  Patient Details  Name: Paula Parker MRN: [id number] Date of Birth: 27-Jun-1960 Referring Provider:  Janith Lima, MD  Encounter Date: 09/12/2014      PT End of Session - 09/12/14 1634    Visit Number 11   Number of Visits 12   Date for PT Re-Evaluation 09/13/14   PT Start Time 0430   PT Stop Time 0500   PT Time Calculation (min) 30 min      Past Medical History  Diagnosis Date  . Alcoholic fatty liver   . GLAUCOMA, BORDERLINE   . HYPERLIPIDEMIA   . HYPERTENSION   . PALPITATIONS, OCCASIONAL   . SMOKER   . GERD (gastroesophageal reflux disease)   . Pseudomembranous colitis   . Hiatal hernia     Past Surgical History  Procedure Laterality Date  . Cervix lesion destruction  1980'S  . Ovary surgery      removed precancerous cells with a laser    There were no vitals filed for this visit.  Visit Diagnosis:  Shoulder contusion, right, sequela  Pain in joint, shoulder region, right      Subjective Assessment - 09/12/14 1702    Currently in Pain? No/denies   Aggravating Factors  reaching behind back, reaching too high   Pain Relieving Factors exercises            [facility] PT Assessment - 09/12/14 1639    Observation/Other Assessments   Focus on Therapeutic Outcomes (FOTO)  29% Limited  33% Predicted 54% INITIAL   AROM   Right Shoulder Flexion 150 Degrees   Right Shoulder ABduction 147 Degrees   Right Shoulder Internal Rotation --  behind back to T10 but painful   Right Shoulder External Rotation 75 Degrees   Strength   Right Shoulder Flexion 4+/5   Right Shoulder ABduction 4/5   Right Shoulder Internal Rotation 4+/5   Right Shoulder External Rotation 4/5                     [facility] Adult PT Treatment/Exercise - 09/12/14 0001    Shoulder Exercises:  Standing   External Rotation Strengthening;Right;15 reps;Theraband   Theraband Level (Shoulder External Rotation) Level 2 (Red)   Internal Rotation Right;Theraband   Theraband Level (Shoulder Internal Rotation) Level 2 (Red)   Flexion Strengthening;Right;Theraband   Theraband Level (Shoulder Flexion) Level 2 (Red)   Flexion Limitations punch   Extension Both;15 reps;Theraband   Theraband Level (Shoulder Extension) Level 2 (Red)   Row 15 reps;Right;Weights   Theraband Level (Shoulder Row) Level 2 (Red)   Other Standing Exercises Yellow band right scaption on wall 10x  fatigue at end of session   Shoulder Exercises: ROM/Strengthening   UBE (Upper Arm Bike) 3 min forward, 3 minutes back level 2                  PT Short Term Goals - 08/22/14 1506    PT SHORT TERM GOAL #1   Title Pt will be I with initial basic  HEP for Rt UE ROM, strength   Status Achieved   PT SHORT TERM GOAL #2   Title Patient will report a 25% reduction in pain with household and work duties using right UE   Status Achieved   PT SHORT TERM GOAL #3   Title Pt will be able to reach  to 140 deg for work, home tasks with min pain increase   Status Achieved           PT Long Term Goals - 09/12/14 1641    PT LONG TERM GOAL #1   Title Pt. will be I with principles of RICE, posture and body mechanics for work duties.   Time 6   Period Weeks   Status Achieved   PT LONG TERM GOAL #2   Title Patient will have right shoulder flexion to 150 degrees needed for reaching eye level and above for work duties.     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     Time 6   Period Weeks   Status Partially Met   PT LONG TERM GOAL #4   Title FOTO functional outcome score improved from 43% to 33% indicating improved function with less pain   Time 6   Period Weeks   Status Achieved  29%                Plan - 09/12/14 1656    Clinical Impression Statement All LTGs Met except LTG# 3 partially met. Pt returns to MD 09/19/2014 for follow up and has not been released for overhead reaching at work. She reports no difficulty with work duties due to restrictions and she has not really been lifting much at work.    PT Next Visit Plan discharge today to HEP        Problem List Patient Active Problem List   Diagnosis Date Noted  . CAP (community acquired pneumonia) 07/19/2013  . Allergic rhinitis 07/19/2013  . Dysphagia, pharyngoesophageal phase 04/27/2013  . GERD (gastroesophageal reflux disease) 04/17/2013  . Other screening mammogram 04/17/2013  . Atrophic vaginitis 02/03/2012  . Routine general medical examination at a health care facility 01/24/2012  . Periodic health assessment, Pap and pelvic 01/24/2012  . SMOKER 04/28/2010  . Alcoholic fatty liver 35/46/5681  . GLAUCOMA, BORDERLINE 04/27/2010  . HIATAL HERNIA 04/27/2010  . HYPERLIPIDEMIA 04/16/2010  . Essential hypertension, benign 06/02/2007    Dorene Ar, PTA 09/12/2014, 5:04 PM  [facility] [address] [place], [place], [address] Phone: [phone]   Fax:  [phone]     PHYSICAL THERAPY DISCHARGE SUMMARY  Visits from Start of Care: 11  Current functional level related to goals / functional outcomes: See above.  Majority of goals met.  Patient has made excellent improvements in pain reduction and ROM recovery.   Remaining deficits: Patient is working but has not been released by the MD for overhead work.   Education / Equipment: HEP Plan: Patient agrees to discharge.  Patient goals were met. Patient is being discharged due to meeting the stated rehab goals.  ?????   Ruben Im, PT 09/13/2014 6:50 AM Phone: [phone] Fax: [phone]

## 2014-11-30 ENCOUNTER — Emergency Department (HOSPITAL_COMMUNITY)
Admission: EM | Admit: 2014-11-30 | Discharge: 2014-11-30 | Disposition: A | Discharge status: Home / Self Care | Payer: 59 | Attending: Emergency Medicine | Admitting: Emergency Medicine

## 2014-11-30 ENCOUNTER — Emergency Department (HOSPITAL_COMMUNITY): Payer: 59

## 2014-11-30 ENCOUNTER — Encounter (HOSPITAL_COMMUNITY): Payer: Self-pay | Admitting: Emergency Medicine

## 2014-11-30 DIAGNOSIS — Z87891 Personal history of nicotine dependence: Secondary | ICD-10-CM | POA: Diagnosis not present

## 2014-11-30 DIAGNOSIS — E785 Hyperlipidemia, unspecified: Secondary | ICD-10-CM | POA: Diagnosis not present

## 2014-11-30 DIAGNOSIS — Z880 Allergy status to penicillin: Secondary | ICD-10-CM | POA: Insufficient documentation

## 2014-11-30 DIAGNOSIS — M25551 Pain in right hip: Secondary | ICD-10-CM | POA: Insufficient documentation

## 2014-11-30 DIAGNOSIS — I10 Essential (primary) hypertension: Secondary | ICD-10-CM | POA: Insufficient documentation

## 2014-11-30 DIAGNOSIS — K219 Gastro-esophageal reflux disease without esophagitis: Secondary | ICD-10-CM | POA: Insufficient documentation

## 2014-11-30 MED ORDER — NAPROXEN 500 MG PO TABS
500.0000 mg | ORAL_TABLET | Freq: Two times a day (BID) | ORAL | Status: DC
Start: 2014-11-30 — End: 2014-12-05

## 2014-11-30 MED ORDER — CYCLOBENZAPRINE HCL 5 MG PO TABS: 5.0000 mg | ORAL_TABLET | Freq: Three times a day (TID) | ORAL | Status: DC | PRN

## 2014-11-30 NOTE — ED Notes (Signed)
Declined W/C at D/C and was escorted to lobby by RN. 

## 2014-11-30 NOTE — ED Provider Notes (Signed)
CSN: [id number]     Arrival date & time 11/30/14  4098 History   First MD Initiated Contact with Patient 11/30/14 1011     Chief Complaint  Patient presents with  . Hip Pain     (Consider location/radiation/quality/duration/timing/severity/associated sxs/prior Treatment) HPI   Patient presents today with right hip pain x 1 mo that has starting affecting her ADLs.  She works in [employer].  One month ago she was chasing after her dog and noticed an moderate aching pain in her right hip.  She treated it with 200 mg of Motrin and rest and felt better the next day.  However, over the past month it has been getting progressively worse, and she now has trouble walking and performing her ADLs, even using her husband's cane to get around the house last night.  (patient is ambulatory without assistance at baseline).  She reports moving makes it worse and the 200 mg Motrin helps just a little bit.  No history of trauma to the area.  She reports sporadic radiation of the pain to the right shin.  Denies numbness, tingling, leg swelling, or calf pain.  Past Medical History  Diagnosis Date  . Alcoholic fatty liver   . GLAUCOMA, BORDERLINE   . HYPERLIPIDEMIA   . HYPERTENSION   . PALPITATIONS, OCCASIONAL   . SMOKER   . GERD (gastroesophageal reflux disease)   . Pseudomembranous colitis   . Hiatal hernia    Past Surgical History  Procedure Laterality Date  . Cervix lesion destruction  1980'S  . Ovary surgery      removed precancerous cells with a laser   Family History  Problem Relation Age of Onset  . Coronary artery disease Other     female first degree <50  . Ovarian cancer Sister 85  . Heart disease Sister   . Early death Sister   . Diabetes Maternal Grandfather   . ALS Mother   . Stroke Neg Hx   . Kidney disease Neg Hx   . Hypertension Neg Hx   . Hyperlipidemia Neg Hx    Social History  Substance Use Topics  . Smoking status: Former Smoker -- 1.00 packs/day    Types:  Cigarettes    Quit date: 04/27/2014  . Smokeless tobacco: Never Used  . Alcohol Use: 0.0 oz/week    0 Standard drinks or equivalent per week     Comment: OCC   OB History    Gravida Para Term Preterm AB TAB SAB Ectopic Multiple Living   3 1 1  0 2 0 2 0  1     Review of Systems  All other systems reviewed and are negative.     Allergies  Doxycycline; Doxycycline; Penicillins; Sulfa antibiotics; Tessalon; Benzonatate; Penicillins; and Sulfonamide derivatives  Home Medications   Prior to Admission medications   Medication Sig Start Date End Date Taking? Authorizing Provider  cetirizine (ZYRTEC) 10 MG tablet Take 1 tablet (10 mg total) by mouth daily. 08/09/14   Biagio Borg, MD  dextromethorphan (DELSYM) 30 MG/5ML liquid Take 10 mLs (60 mg total) by mouth 2 (two) times daily. Patient not taking: Reported on 06/27/2014 04/22/14   Waynetta Pean, PA-C  omeprazole (PRILOSEC) 20 MG capsule Take 1 capsule by mouth every morning 30 minutes before breakfast Patient not taking: Reported on 06/27/2014 01/31/14   Cecille Rubin P Hvozdovic, PA-C  Throat Lozenges (LOZENGES MT) Use as directed 1 lozenge in the mouth or throat every hour as needed (coughing).  Historical Provider, MD  triamcinolone (NASACORT AQ) 55 MCG/ACT AERO nasal inhaler Place 2 sprays into the nose daily. 08/09/14   Biagio Borg, MD   BP 135/90 mmHg  Pulse 93  Temp(Src) 98 F (36.7 C) (Oral)  Resp 20  Wt 160 lb (72.576 kg)  SpO2 96%   Physical Exam  Constitutional: She is oriented to person, place, and time. She appears well-developed and well-nourished.  Cardiovascular: Normal rate, regular rhythm and normal heart sounds.   Pulmonary/Chest: Effort normal and breath sounds normal.  Abdominal: Soft. Bowel sounds are normal. She exhibits no distension.  Musculoskeletal:       Right hip: She exhibits tenderness. She exhibits normal range of motion, normal strength, no bony tenderness, no swelling, no crepitus and no deformity.        Left hip: Normal.  Tenderness at right upper gluteus maximus.  No spinal tenderness.  Decreased forward and lateral flexion of lumbar spine.  Pain with active range of motion of lumbar spine.   Neurological: She is alert and oriented to person, place, and time.  Gait is slowed with limp favoring her right hip.    ED Course  Procedures (including critical care time) Labs Review Labs Reviewed - No data to display  Imaging Review Dg Lumbar Spine Complete  11/30/2014   CLINICAL DATA:  RIGHT posterior back pain near hip when moving can't from changing from sitting to a standing position, symptoms for about a month, pain began after incident when she was chasing her dog, but no specific injury encountered  EXAM: LUMBAR SPINE - COMPLETE 4+ VIEW  COMPARISON:  03/15/2012  FINDINGS: Osseous demineralization.  Five non-rib-bearing lumbar vertebra.  Vertebral body and disc space heights maintained.  No acute fracture, subluxation or bone destruction.  No spondylolysis.  SI joints symmetric.  IMPRESSION: Osseous demineralization.  No acute bony abnormalities.   Electronically Signed   By: Lavonia Dana M.D.   On: 11/30/2014 11:39   I have personally reviewed and evaluated these images and lab results as part of my medical decision-making.   EKG Interpretation None      MDM   Final diagnoses:  None    Patient presenting with gradually worsening right hip pain x 1 month.  Mild symptomatic relief with Motrin and rest.  Xray complete lumbar spine shows no evidence of fracture.  Pt given naproxen, flexeril, and follow up with PCP and patient agrees and acknowledges plan.    Gloriann Loan, PA-C 11/30/14 [place], MD 12/02/14 [phone]

## 2014-11-30 NOTE — ED Notes (Signed)
Pt is ambulatory, however she has a lot of pain in right hip when moving and going from sitting to standing position

## 2014-11-30 NOTE — ED Notes (Signed)
Pt. Stated, My right hip flared up now for a month.  When I get out of bed its a 10

## 2014-11-30 NOTE — Discharge Instructions (Signed)

## 2014-12-05 ENCOUNTER — Ambulatory Visit (INDEPENDENT_AMBULATORY_CARE_PROVIDER_SITE_OTHER): Payer: 59 | Admitting: Internal Medicine

## 2014-12-05 ENCOUNTER — Encounter: Payer: Self-pay | Admitting: Internal Medicine

## 2014-12-05 VITALS — BP 138/88 | HR 72 | Temp 98.4°F | Resp 16 | Ht 60.0 in | Wt 159.0 lb

## 2014-12-05 DIAGNOSIS — M5416 Radiculopathy, lumbar region: Secondary | ICD-10-CM

## 2014-12-05 DIAGNOSIS — M5417 Radiculopathy, lumbosacral region: Secondary | ICD-10-CM

## 2014-12-05 DIAGNOSIS — M810 Age-related osteoporosis without current pathological fracture: Secondary | ICD-10-CM | POA: Diagnosis not present

## 2014-12-05 MED ORDER — HYDROCODONE-ACETAMINOPHEN 5-325 MG PO TABS: 1.0000 | ORAL_TABLET | Freq: Four times a day (QID) | ORAL | Status: DC | PRN

## 2014-12-05 NOTE — Patient Instructions (Signed)
Back Pain, Adult Low back pain is very common. About 1 in 5 people have back pain.The cause of low back pain is rarely dangerous. The pain often gets better over time.About half of people with a sudden onset of back pain feel better in just 2 weeks. About 8 in 10 people feel better by 6 weeks.  CAUSES Some common causes of back pain include:  Strain of the muscles or ligaments supporting the spine.  Wear and tear (degeneration) of the spinal discs.  Arthritis.  Direct injury to the back. DIAGNOSIS Most of the time, the direct cause of low back pain is not known.However, back pain can be treated effectively even when the exact cause of the pain is unknown.Answering your caregiver's questions about your overall health and symptoms is one of the most accurate ways to make sure the cause of your pain is not dangerous. If your caregiver needs more information, he or she may order lab work or imaging tests (X-rays or MRIs).However, even if imaging tests show changes in your back, this usually does not require surgery. HOME CARE INSTRUCTIONS For many people, back pain returns.Since low back pain is rarely dangerous, it is often a condition that people can learn to manageon their own.   Remain active. It is stressful on the back to sit or stand in one place. Do not sit, drive, or stand in one place for more than 30 minutes at a time. Take short walks on level surfaces as soon as pain allows.Try to increase the length of time you walk each day.  Do not stay in bed.Resting more than 1 or 2 days can delay your recovery.  Do not avoid exercise or work.Your body is made to move.It is not dangerous to be active, even though your back may hurt.Your back will likely heal faster if you return to being active before your pain is gone.  Pay attention to your body when you bend and lift. Many people have less discomfortwhen lifting if they bend their knees, keep the load close to their bodies,and  avoid twisting. Often, the most comfortable positions are those that put less stress on your recovering back.  Find a comfortable position to sleep. Use a firm mattress and lie on your side with your knees slightly bent. If you lie on your back, put a pillow under your knees.  Only take over-the-counter or prescription medicines as directed by your caregiver. Over-the-counter medicines to reduce pain and inflammation are often the most helpful.Your caregiver may prescribe muscle relaxant drugs.These medicines help dull your pain so you can more quickly return to your normal activities and healthy exercise.  Put ice on the injured area.  Put ice in a plastic bag.  Place a towel between your skin and the bag.  Leave the ice on for 15-20 minutes, 03-04 times a day for the first 2 to 3 days. After that, ice and heat may be alternated to reduce pain and spasms.  Ask your caregiver about trying back exercises and gentle massage. This may be of some benefit.  Avoid feeling anxious or stressed.Stress increases muscle tension and can worsen back pain.It is important to recognize when you are anxious or stressed and learn ways to manage it.Exercise is a great option. SEEK MEDICAL CARE IF:  You have pain that is not relieved with rest or medicine.  You have pain that does not improve in 1 week.  You have new symptoms.  You are generally not feeling well. SEEK   IMMEDIATE MEDICAL CARE IF:   You have pain that radiates from your back into your legs.  You develop new bowel or bladder control problems.  You have unusual weakness or numbness in your arms or legs.  You develop nausea or vomiting.  You develop abdominal pain.  You feel faint. Document Released: 03/22/2005 Document Revised: 09/21/2011 Document Reviewed: 07/24/2013 ExitCare Patient Information 2015 [employer]. This information is not intended to replace advice given to you by your health care provider. Make sure you  discuss any questions you have with your health care provider.  

## 2014-12-05 NOTE — Progress Notes (Signed)
Pre visit review using our clinic review tool, if applicable. No additional management support is needed unless otherwise documented below in the visit note. 

## 2014-12-06 ENCOUNTER — Encounter: Payer: Self-pay | Admitting: Internal Medicine

## 2014-12-06 NOTE — Progress Notes (Signed)
Subjective:  Patient ID: Paula Parker, female    DOB: 01-Sep-1960  Age: 54 y.o. MRN: [id number]  CC: Back Pain   HPI Paula Parker presents for follow-up on right-sided low back pain. She reports that she bent over 4 days ago and felt the sudden onset of right lower back pain that radiated into her right thigh. She was seen in the emergency room and plain films were negative for any acute injury but they did show demineralization concerning for osteoporosis. She was prescribed anti-inflammatories and muscle relaxers but states that they have caused side effects so she stopped taking them. She has moderately severe pain with any movement. She has not noticed any numbness/tingling/weakness in her legs. She has not experienced any bowel, bladder incontinence or retention. She does not feel like she is ready to go back to work at this time.  Outpatient Prescriptions Prior to Visit  Medication Sig Dispense Refill  . cetirizine (ZYRTEC) 10 MG tablet Take 1 tablet (10 mg total) by mouth daily. 30 tablet 11  . naproxen (NAPROSYN) 500 MG tablet Take 1 tablet (500 mg total) by mouth 2 (two) times daily. 20 tablet 0  . cyclobenzaprine (FLEXERIL) 5 MG tablet Take 1 tablet (5 mg total) by mouth 3 (three) times daily as needed for muscle spasms. 9 tablet 0  . dextromethorphan (DELSYM) 30 MG/5ML liquid Take 10 mLs (60 mg total) by mouth 2 (two) times daily. (Patient not taking: Reported on 06/27/2014) 89 mL 0  . omeprazole (PRILOSEC) 20 MG capsule Take 1 capsule by mouth every morning 30 minutes before breakfast (Patient not taking: Reported on 06/27/2014) 30 capsule 11  . Throat Lozenges (LOZENGES MT) Use as directed 1 lozenge in the mouth or throat every hour as needed (coughing).    . triamcinolone (NASACORT AQ) 55 MCG/ACT AERO nasal inhaler Place 2 sprays into the nose daily. (Patient not taking: Reported on 12/05/2014) 1 Inhaler 12   No facility-administered medications prior to visit.    ROS Review of  Systems  Constitutional: Negative.   HENT: Negative.   Eyes: Negative.   Respiratory: Negative.   Cardiovascular: Negative.   Gastrointestinal: Negative.  Negative for vomiting, abdominal pain, diarrhea and constipation.  Endocrine: Negative.   Genitourinary: Negative.  Negative for difficulty urinating.  Musculoskeletal: Positive for back pain. Negative for myalgias, joint swelling, arthralgias, gait problem, neck pain and neck stiffness.  Skin: Negative.   Allergic/Immunologic: Negative.   Neurological: Negative.  Negative for dizziness, tremors, syncope, weakness, light-headedness and numbness.  Hematological: Negative.  Negative for adenopathy. Does not bruise/bleed easily.  Psychiatric/Behavioral: Negative.     Objective:  BP 138/88 mmHg  Pulse 75  Temp(Src) 98.4 F (36.9 C) (Oral)  Resp 16  Ht 5' (1.524 m)  Wt 159 lb (72.122 kg)  BMI 31.05 kg/m2  SpO2 97%  BP Readings from Last 3 Encounters:  12/05/14 138/88  11/30/14 134/76  08/09/14 136/88    Wt Readings from Last 3 Encounters:  12/05/14 159 lb (72.122 kg)  11/30/14 160 lb (72.576 kg)  08/09/14 149 lb (67.586 kg)    Physical Exam  Constitutional: No distress.  HENT:  Head: Normocephalic and atraumatic.  Mouth/Throat: Oropharynx is clear and moist. No oropharyngeal exudate.  Eyes: Conjunctivae are normal. Right eye exhibits no discharge. Left eye exhibits no discharge. No scleral icterus.  Neck: Normal range of motion. Neck supple. No JVD present. No tracheal deviation present. No thyromegaly present.  Cardiovascular: Normal rate, regular rhythm, normal heart sounds and  intact distal pulses.  Exam reveals no gallop and no friction rub.   No murmur heard. Pulmonary/Chest: Effort normal and breath sounds normal. No stridor. No respiratory distress. She has no wheezes. She has no rales. She exhibits no tenderness.  Abdominal: Soft. Bowel sounds are normal. She exhibits no distension and no mass. There is no  tenderness. There is no rebound and no guarding.  Musculoskeletal: Normal range of motion. She exhibits no edema or tenderness.       Lumbar back: Normal. She exhibits normal range of motion, no tenderness, no bony tenderness, no swelling, no deformity, no laceration, no pain, no spasm and normal pulse.  Lymphadenopathy:    She has no cervical adenopathy.  Neurological: She has normal strength. She is unresponsive. She displays no atrophy, no tremor and normal reflexes. No cranial nerve deficit or sensory deficit. She exhibits normal muscle tone. She displays a negative Romberg sign. She displays no seizure activity. Coordination and gait normal.  Reflex Scores:      Tricep reflexes are 1+ on the right side and 1+ on the left side.      Bicep reflexes are 1+ on the right side and 1+ on the left side.      Brachioradialis reflexes are 1+ on the right side and 1+ on the left side.      Patellar reflexes are 1+ on the right side and 1+ on the left side.      Achilles reflexes are 1+ on the right side and 1+ on the left side. Negative straight leg raise in bilateral lower extremities.  Skin: Skin is warm and dry. No rash noted. She is not diaphoretic. No erythema. No pallor.    Lab Results  Component Value Date   WBC 6.8 01/22/2014   HGB 15.0 01/22/2014   HCT 45.9 01/22/2014   PLT 250.0 01/22/2014   GLUCOSE 89 01/22/2014   CHOL 182 04/17/2013   TRIG 40.0 04/17/2013   HDL 87.90 04/17/2013   LDLCALC 86 04/17/2013   ALT 19 01/22/2014   AST 26 01/22/2014   NA 142 01/22/2014   K 3.9 01/22/2014   CL 108 01/22/2014   CREATININE 0.8 01/22/2014   BUN 13 01/22/2014   CO2 21 01/22/2014   TSH 1.77 04/17/2013    Dg Lumbar Spine Complete  11/30/2014   CLINICAL DATA:  RIGHT posterior back pain near hip when moving can't from changing from sitting to a standing position, symptoms for about a month, pain began after incident when she was chasing her dog, but no specific injury encountered  EXAM:  LUMBAR SPINE - COMPLETE 4+ VIEW  COMPARISON:  03/15/2012  FINDINGS: Osseous demineralization.  Five non-rib-bearing lumbar vertebra.  Vertebral body and disc space heights maintained.  No acute fracture, subluxation or bone destruction.  No spondylolysis.  SI joints symmetric.  IMPRESSION: Osseous demineralization.  No acute bony abnormalities.   Electronically Signed   By: Lavonia Dana M.D.   On: 11/30/2014 11:39    Assessment & Plan:   Loany was seen today for back pain.  Diagnoses and all orders for this visit:  Osteoporosis -     DG Bone Density; Future  Right lumbar radiculitis- I think she has an acute disc herniation but I do not elicit any evidence of radiculopathy on her exam today. She does not want to take anti-inflammatories or muscle relaxers anymore. She refused a course of steroids to reduce her pain and inflammation. Will offer hydrocodone and Tylenol for symptom  relief. I have asked her to start physical therapy. She is allowed to return to work in about 2 days with light duty. -     Ambulatory referral to Physical Therapy -     HYDROcodone-acetaminophen (NORCO/VICODIN) 5-325 MG per tablet; Take 1 tablet by mouth every 6 (six) hours as needed for moderate pain.   I have discontinued Ms. Kreuzer's omeprazole, Throat Lozenges (LOZENGES MT), dextromethorphan, triamcinolone, cyclobenzaprine, and naproxen. I am also having her start on HYDROcodone-acetaminophen. Additionally, I am having her maintain her cetirizine.  Meds ordered this encounter  Medications  . HYDROcodone-acetaminophen (NORCO/VICODIN) 5-325 MG per tablet    Sig: Take 1 tablet by mouth every 6 (six) hours as needed for moderate pain.    Dispense:  65 tablet    Refill:  0     Follow-up: Return in about 4 weeks (around 01/02/2015).  Paula Calico, MD

## 2014-12-11 ENCOUNTER — Telehealth: Payer: Self-pay | Admitting: Internal Medicine

## 2014-12-11 NOTE — Telephone Encounter (Signed)
We wrote a letter on 12/05/2014 allowing her to go back to work, but it needs to state "no restrictions". Please put the following in a new letter and fax to # [phone] ASAP. She is unable to work until we send it in.    To Whom It May Concern:  It is my medical opinion that Paula Parker may return to work on 12/10/2014 with no restrictions.  If you have any questions or concerns, please don't hesitate to call.  Sincerely,

## 2014-12-11 NOTE — Telephone Encounter (Signed)
Pended letter. Pt is not requesting letter stating no restrictions. She is unable to return to work until note is given.

## 2014-12-25 ENCOUNTER — Ambulatory Visit: Payer: 59 | Admitting: Physical Therapy

## 2015-01-13 IMAGING — CT CT CERVICAL SPINE W/O CM
1 of 2 series · 9 of 14 positions shown, 12 images · non-contrast
Comparison: Cervical spine radiographs 09/27/2005

CT HEAD

CLINICAL DATA: The patient fell down steps.  Struck head on the
left.  Left sided headache.  Nausea.]

CT HEAD WITHOUT CONTRAST
CT CERVICAL SPINE WITHOUT CONTRAST
TECHNIQUE: Multidetector CT imaging of the head and cervical spine
was performed following the standard protocol without intravenous
contrast.  Multiplanar CT image reconstructions of the cervical
spine were also generated.

[Series 6: c_spine 2.0 b31s detail · axial · 0.28mm/px · z∈[-311,-161]mm · 9 of 95 slices shown, 12 images]
[im 10/95  soft-tissue]
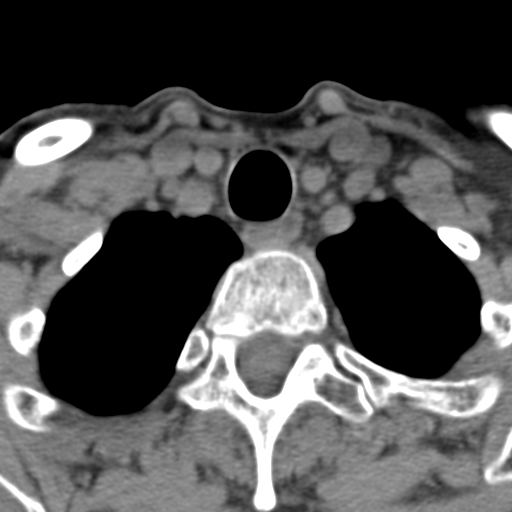
[im 10/95  bone]
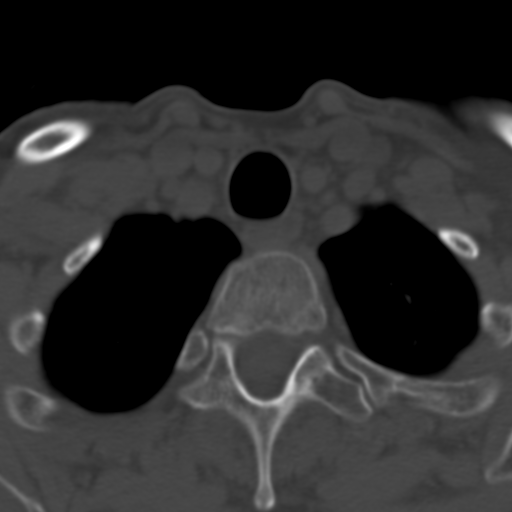
[im 19/95  bone]
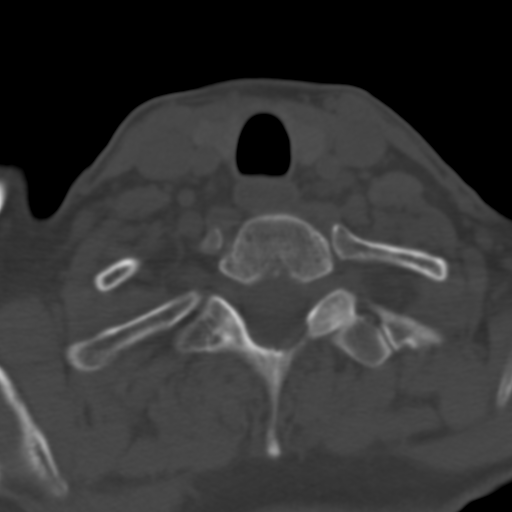
[im 29/95  bone]
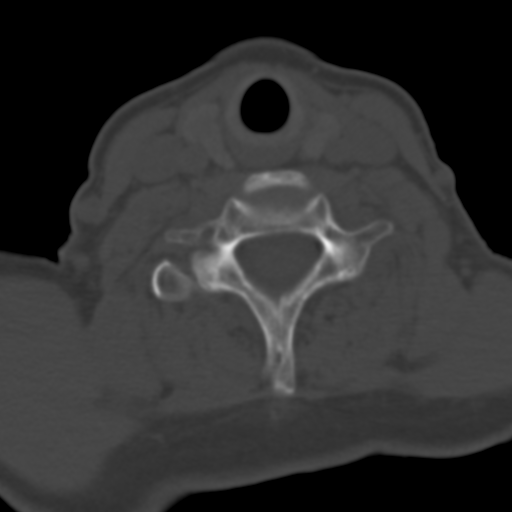
[im 38/95  bone]
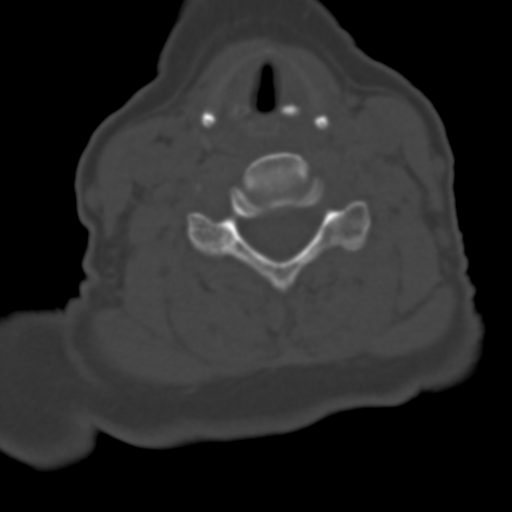
[im 48/95  soft-tissue]
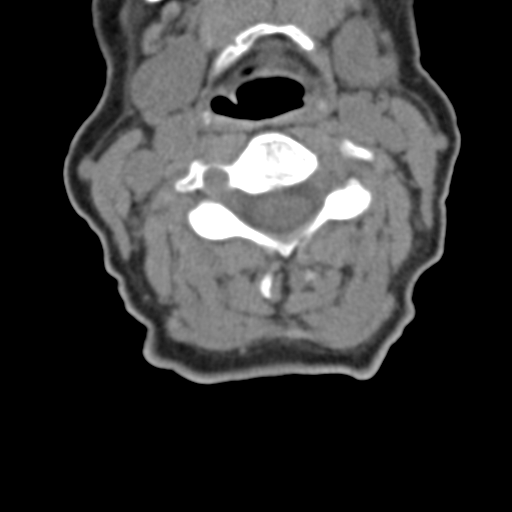
[im 48/95  bone]
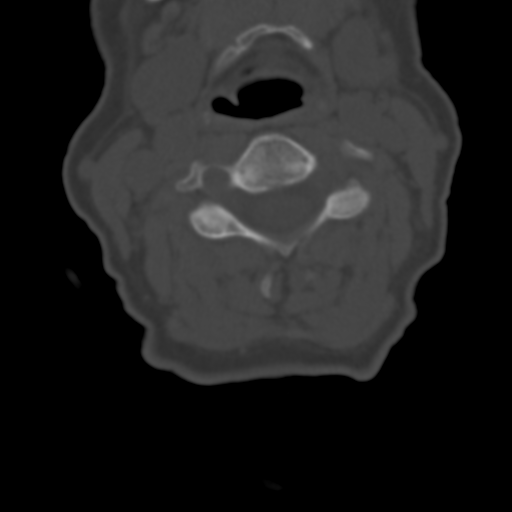
[im 57/95  bone]
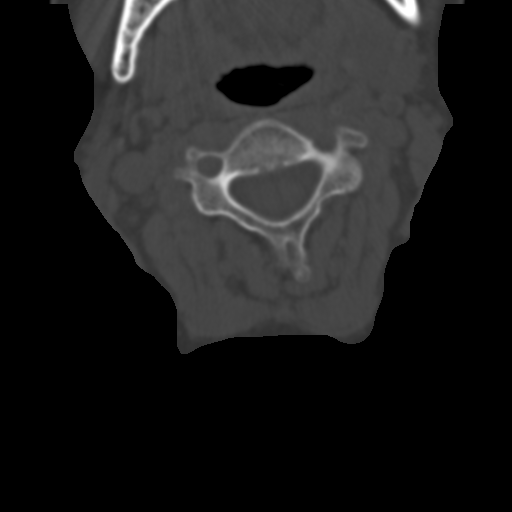
[im 66/95  bone]
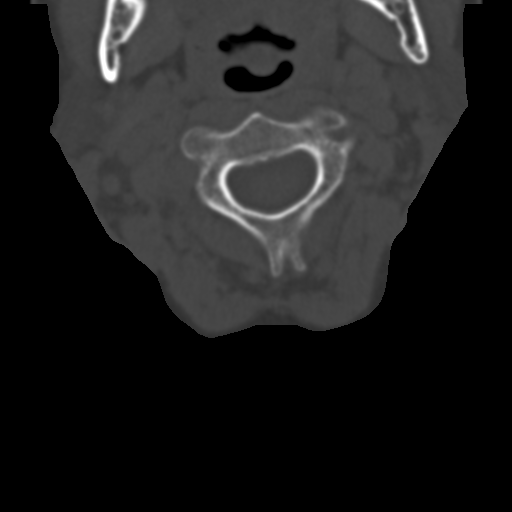
[im 76/95  bone]
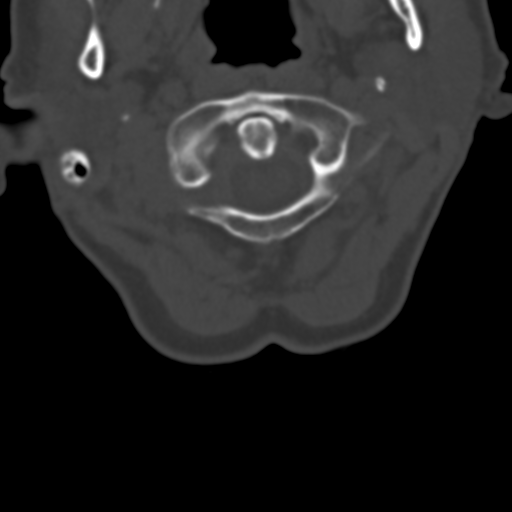
[im 85/95  soft-tissue]
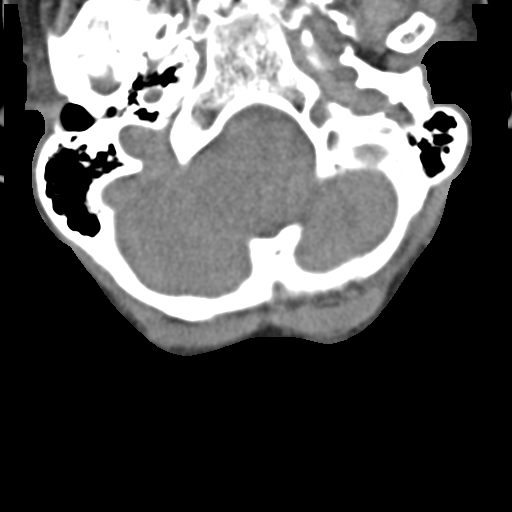
[im 85/95  bone]
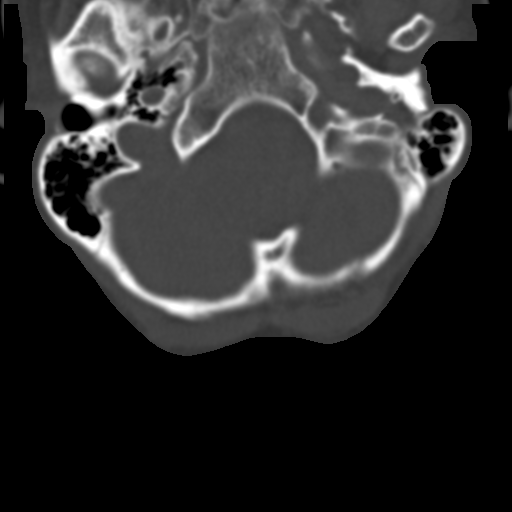

[9 of 14 positions shown; findings below may reference images not displayed]

FINDINGS: The ventricles and sulci are symmetrical without
significant effacement, displacement, or dilatation. No mass effect
or midline shift. No abnormal extra-axial fluid collections. The
grey-white matter junction is distinct. Basal cisterns are not
effaced. No acute intracranial hemorrhage. No depressed skull
fractures.  Visualized paranasal sinuses and mastoid air cells are
not opacified.
IMPRESSION: No acute intracranial abnormalities.

CT CERVICAL SPINE
FINDINGS: Normal alignment of the cervical vertebrae and facet
joints.  Lateral masses of C1 appear symmetrical.  The odontoid
process is intact.  No vertebral compression deformities.
Intervertebral disc space heights are preserved.  No prevertebral
soft tissue swelling.  No focal bone lesion or bone destruction.
Bone cortex and trabecular architecture appear intact.  No abnormal
paraspinal soft tissue infiltration.  Cervical lymph nodes are not
pathologically enlarged.
IMPRESSION: No displaced cervical spine fractures are identified.

## 2015-01-16 IMAGING — CR DG CHEST 2V
2 series · 2 of 2 positions shown · non-contrast
Comparison: One-view chest x-ray 06/18/2012 and two-view chest x-
ray 01/24/2012, 08/26/2011, 02/07/2006.

CLINICAL DATA: Fell 3 days ago, persistent left-sided chest pain
and left shoulder pain.

CHEST - 2 VIEW

[view not recorded (1 of 2)]
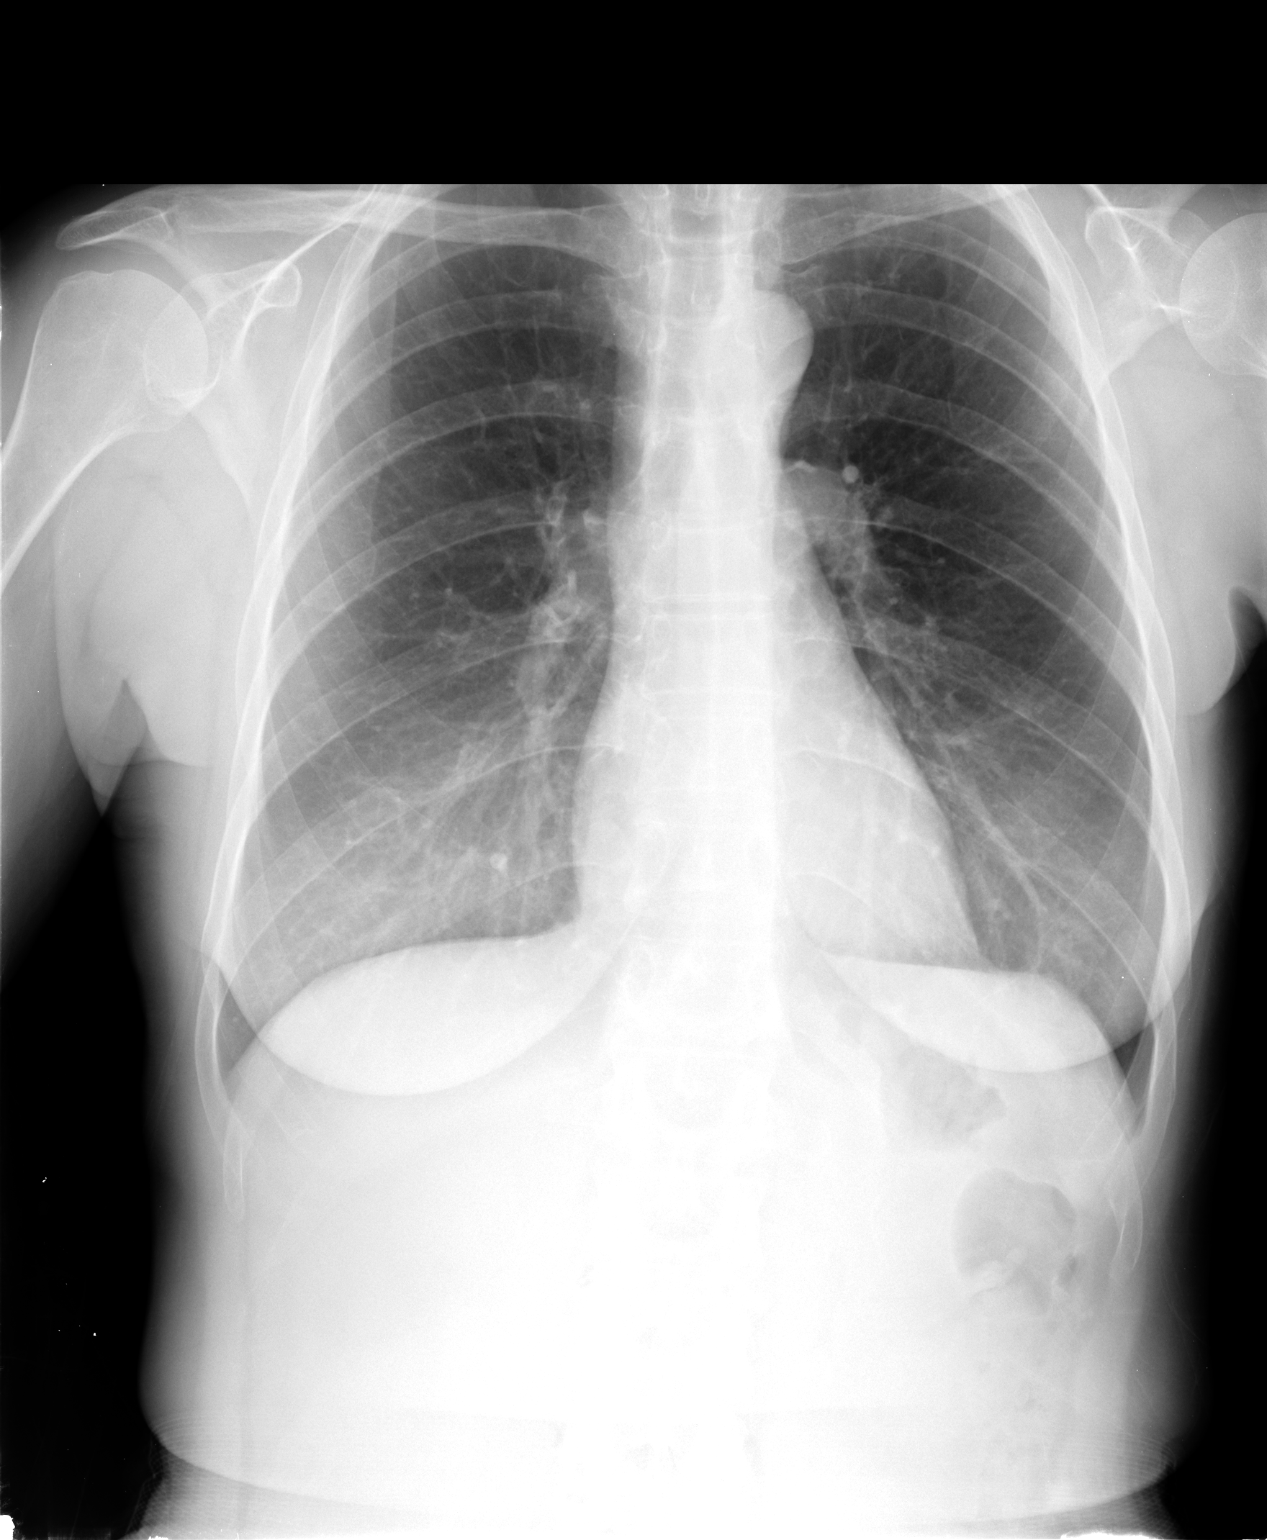

[view not recorded (2 of 2)]
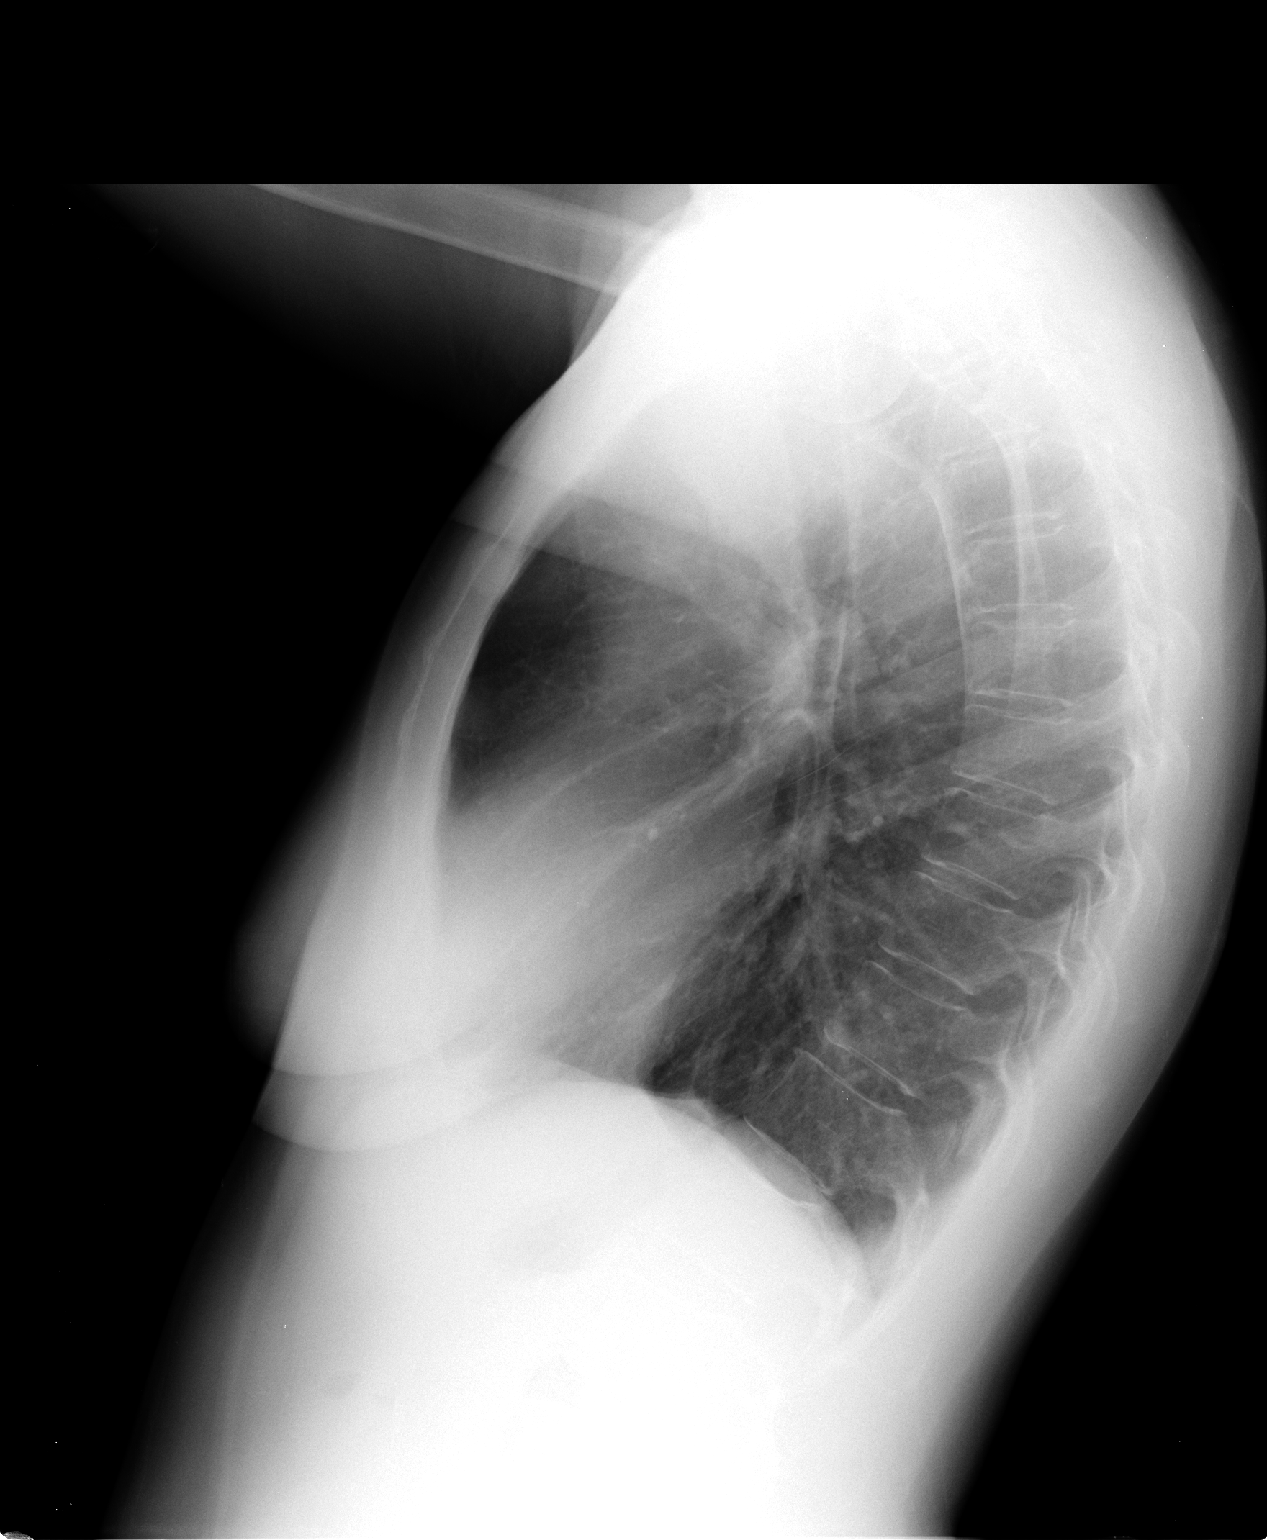

[2 of 2 positions shown; findings below may reference images not displayed]

FINDINGS: Cardiomediastinal silhouette unremarkable, unchanged.
Lungs clear.  Bronchovascular markings normal.  Pulmonary
vascularity normal.  No pneumothorax.  No pleural effusions.
Visualized bony thorax intact.  No significant interval change.
IMPRESSION: Normal and stable examination.

## 2015-04-02 ENCOUNTER — Ambulatory Visit (INDEPENDENT_AMBULATORY_CARE_PROVIDER_SITE_OTHER): Payer: 59 | Admitting: Family

## 2015-04-02 ENCOUNTER — Encounter: Payer: Self-pay | Admitting: Family

## 2015-04-02 VITALS — BP 162/98 | HR 106 | Temp 98.2°F | Resp 18 | Ht 60.0 in | Wt 167.0 lb

## 2015-04-02 DIAGNOSIS — J019 Acute sinusitis, unspecified: Secondary | ICD-10-CM

## 2015-04-02 MED ORDER — PROMETHAZINE-DM 6.25-15 MG/5ML PO SYRP: 5.0000 mL | ORAL_SOLUTION | Freq: Four times a day (QID) | ORAL | Status: DC | PRN

## 2015-04-02 MED ORDER — AZITHROMYCIN 250 MG PO TABS: ORAL_TABLET | ORAL | Status: DC

## 2015-04-02 NOTE — Progress Notes (Signed)
Pre visit review using our clinic review tool, if applicable. No additional management support is needed unless otherwise documented below in the visit note. 

## 2015-04-02 NOTE — Progress Notes (Signed)
Subjective:    Patient ID: Paula Parker, female    DOB: 27-Mar-1961, 54 y.o.   MRN: [id number]  Chief Complaint  Patient presents with  . Cough    x4 days, productive cough, congestion, sinus pressure and headache    HPI:  Paula Parker is a 54 y.o. female who  has a past medical history of Alcoholic fatty liver; GLAUCOMA, BORDERLINE; HYPERLIPIDEMIA; HYPERTENSION; PALPITATIONS, OCCASIONAL; SMOKER; GERD (gastroesophageal reflux disease); Pseudomembranous colitis; and Hiatal hernia. and presents today for an acute office visit.  This is a new problem. Associated symptoms of productive cough, congestion, sinus pressure, and headache that has been going on for approximately 4 days. Modifying factors include Tylenol and Zyrtec which has helped mildly with the symptoms. Denies fevers. Severity of the cough is enough to disturb her sleep. Over the course of the 4 days she had originally gotten better and then got worse. Denies any recent antibiotic use.    Allergies  Allergen Reactions  . Doxycycline Other (See Comments)    Throat swelling  . Benzonatate Rash and Other (See Comments)    unknown  . Penicillins Rash  . Sulfonamide Derivatives Rash and Other (See Comments)    unknown     Current Outpatient Prescriptions on File Prior to Visit  Medication Sig Dispense Refill  . cetirizine (ZYRTEC) 10 MG tablet Take 1 tablet (10 mg total) by mouth daily. 30 tablet 11   No current facility-administered medications on file prior to visit.    Review of Systems  Constitutional: Negative for fever and chills.  HENT: Positive for congestion, ear pain and sinus pressure. Negative for sore throat.   Respiratory: Positive for cough. Negative for chest tightness and shortness of breath.   Neurological: Positive for headaches.      Objective:    BP 162/98 mmHg  Pulse 106  Temp(Src) 98.2 F (36.8 C) (Oral)  Resp 18  Ht 5' (1.524 m)  Wt 167 lb (75.751 kg)  BMI 32.62 kg/m2  SpO2 97% Nursing  note and vital signs reviewed.  Physical Exam  Constitutional: She is oriented to person, place, and time. She appears well-developed and well-nourished. No distress.  HENT:  Right Ear: Hearing, tympanic membrane, external ear and ear canal normal.  Left Ear: Hearing, tympanic membrane, external ear and ear canal normal.  Nose: Right sinus exhibits maxillary sinus tenderness and frontal sinus tenderness. Left sinus exhibits maxillary sinus tenderness and frontal sinus tenderness.  Mouth/Throat: Uvula is midline, oropharynx is clear and moist and mucous membranes are normal.  Neck: Neck supple.  Cardiovascular: Normal rate, regular rhythm, normal heart sounds and intact distal pulses.   Pulmonary/Chest: Effort normal and breath sounds normal.  Neurological: She is alert and oriented to person, place, and time.  Skin: Skin is warm and dry.  Psychiatric: She has a normal mood and affect. Her behavior is normal. Judgment and thought content normal.       Assessment & Plan:   Problem List Items Addressed This Visit      Respiratory   Sinusitis, acute - Primary    Symptoms and exam consistent with sinusitis. Start azithromycin. Start promethazine-DM. Continue over the counter medications as needed for symptom relief and supportive care. Blood pressure is slightly elevated, follow up with PCP if blood pressure continues to be elevated. Follow up if symptoms worsen or fail to improve.       Relevant Medications   azithromycin (ZITHROMAX) 250 MG tablet   promethazine-dextromethorphan (PROMETHAZINE-DM) 6.25-15 MG/5ML syrup

## 2015-04-02 NOTE — Patient Instructions (Addendum)
Thank you for choosing [facility].  Summary/Instructions:  Your prescription(s) have been submitted to your pharmacy or been printed and provided for you. Please take as directed and contact our office if you believe you are having problem(s) with the medication(s) or have any questions.  If your symptoms worsen or fail to improve, please contact our office for further instruction, or in case of emergency go directly to the emergency room at the closest medical facility.   General Recommendations:    Please drink plenty of fluids.  Get plenty of rest   Sleep in humidified air  Use saline nasal sprays  Netti pot   OTC Medications:  Decongestants - helps relieve congestion   Flonase (generic fluticasone) or Nasacort (generic triamcinolone) - please make sure to use the "cross-over" technique at a 45 degree angle towards the opposite eye as opposed to straight up the nasal passageway.   If you have HIGH BLOOD PRESSURE - Coricidin HBP; AVOID any product that is -D as this contains pseudoephedrine which may increase your blood pressure.  Afrin (oxymetazoline) every 6-8 hours for up to 3 days.   Allergies - helps relieve runny nose, itchy eyes and sneezing   Claritin (generic loratidine), Allegra (fexofenidine), or Zyrtec (generic cyrterizine) for runny nose. These medications should not cause drowsiness.  Note - Benadryl (generic diphenhydramine) may be used however may cause drowsiness  Cough -   Delsym or Robitussin (generic dextromethorphan)  Expectorants - helps loosen mucus to ease removal   Mucinex (generic guaifenesin) as directed on the package.  Headaches / General Aches   Tylenol (generic acetaminophen) - DO NOT EXCEED 3 grams (3,000 mg) in a 24 hour time period  Advil/Motrin (generic ibuprofen)   Sore Throat -   Salt water gargle   Chloraseptic (generic benzocaine) spray or lozenges / Sucrets (generic dyclonine)    Sinusitis Sinusitis is  redness, soreness, and inflammation of the paranasal sinuses. Paranasal sinuses are air pockets within the bones of your face (beneath the eyes, the middle of the forehead, or above the eyes). In healthy paranasal sinuses, mucus is able to drain out, and air is able to circulate through them by way of your nose. However, when your paranasal sinuses are inflamed, mucus and air can become trapped. This can allow bacteria and other germs to grow and cause infection. Sinusitis can develop quickly and last only a short time (acute) or continue over a long period (chronic). Sinusitis that lasts for more than 12 weeks is considered chronic.  CAUSES  Causes of sinusitis include:  Allergies.  Structural abnormalities, such as displacement of the cartilage that separates your nostrils (deviated septum), which can decrease the air flow through your nose and sinuses and affect sinus drainage.  Functional abnormalities, such as when the small hairs (cilia) that line your sinuses and help remove mucus do not work properly or are not present. SIGNS AND SYMPTOMS  Symptoms of acute and chronic sinusitis are the same. The primary symptoms are pain and pressure around the affected sinuses. Other symptoms include:  Upper toothache.  Earache.  Headache.  Bad breath.  Decreased sense of smell and taste.  A cough, which worsens when you are lying flat.  Fatigue.  Fever.  Thick drainage from your nose, which often is green and may contain pus (purulent).  Swelling and warmth over the affected sinuses. DIAGNOSIS  Your health care provider will perform a physical exam. During the exam, your health care provider may:  Look in your  nose for signs of abnormal growths in your nostrils (nasal polyps).  Tap over the affected sinus to check for signs of infection.  View the inside of your sinuses (endoscopy) using an imaging device that has a light attached (endoscope). If your health care provider suspects  that you have chronic sinusitis, one or more of the following tests may be recommended:  Allergy tests.  Nasal culture. A sample of mucus is taken from your nose, sent to a lab, and screened for bacteria.  Nasal cytology. A sample of mucus is taken from your nose and examined by your health care provider to determine if your sinusitis is related to an allergy. TREATMENT  Most cases of acute sinusitis are related to a viral infection and will resolve on their own within 10 days. Sometimes medicines are prescribed to help relieve symptoms (pain medicine, decongestants, nasal steroid sprays, or saline sprays).  However, for sinusitis related to a bacterial infection, your health care provider will prescribe antibiotic medicines. These are medicines that will help kill the bacteria causing the infection.  Rarely, sinusitis is caused by a fungal infection. In theses cases, your health care provider will prescribe antifungal medicine. For some cases of chronic sinusitis, surgery is needed. Generally, these are cases in which sinusitis recurs more than 3 times per year, despite other treatments. HOME CARE INSTRUCTIONS   Drink plenty of water. Water helps thin the mucus so your sinuses can drain more easily.  Use a humidifier.  Inhale steam 3 to 4 times a day (for example, sit in the bathroom with the shower running).  Apply a warm, moist washcloth to your face 3 to 4 times a day, or as directed by your health care provider.  Use saline nasal sprays to help moisten and clean your sinuses.  Take medicines only as directed by your health care provider.  If you were prescribed either an antibiotic or antifungal medicine, finish it all even if you start to feel better. SEEK IMMEDIATE MEDICAL CARE IF:  You have increasing pain or severe headaches.  You have nausea, vomiting, or drowsiness.  You have swelling around your face.  You have vision problems.  You have a stiff neck.  You have  difficulty breathing. MAKE SURE YOU:   Understand these instructions.  Will watch your condition.  Will get help right away if you are not doing well or get worse. Document Released: 03/22/2005 Document Revised: 08/06/2013 Document Reviewed: 04/06/2011 [facility] Patient Information 2015 [place], [place]. This information is not intended to replace advice given to you by your health care provider. Make sure you discuss any questions you have with your health care provider.

## 2015-04-02 NOTE — Assessment & Plan Note (Signed)
Symptoms and exam consistent with sinusitis. Start azithromycin. Start promethazine-DM. Continue over the counter medications as needed for symptom relief and supportive care. Blood pressure is slightly elevated, follow up with PCP if blood pressure continues to be elevated. Follow up if symptoms worsen or fail to improve.

## 2015-06-03 ENCOUNTER — Ambulatory Visit (INDEPENDENT_AMBULATORY_CARE_PROVIDER_SITE_OTHER)
Admission: RE | Admit: 2015-06-03 | Discharge: 2015-06-03 | Disposition: A | Discharge status: Home / Self Care | Payer: 59 | Source: Ambulatory Visit | Attending: Internal Medicine | Admitting: Internal Medicine

## 2015-06-03 DIAGNOSIS — M810 Age-related osteoporosis without current pathological fracture: Secondary | ICD-10-CM | POA: Diagnosis not present

## 2015-06-04 ENCOUNTER — Other Ambulatory Visit: Payer: Self-pay | Admitting: Internal Medicine

## 2015-06-04 DIAGNOSIS — M810 Age-related osteoporosis without current pathological fracture: Secondary | ICD-10-CM

## 2015-06-04 LAB — HM DEXA SCAN

## 2015-06-05 ENCOUNTER — Telehealth: Payer: Self-pay | Admitting: Internal Medicine

## 2015-06-05 NOTE — Telephone Encounter (Signed)
I have not called pt. I do not see where anyone has tried to contact her. Forwarding to Dr. Ronnald Ramp CMA.

## 2015-06-05 NOTE — Telephone Encounter (Signed)
Pt returning your call. Please give her a call

## 2015-06-19 ENCOUNTER — Encounter: Payer: Self-pay | Admitting: Endocrinology

## 2015-06-19 ENCOUNTER — Ambulatory Visit (INDEPENDENT_AMBULATORY_CARE_PROVIDER_SITE_OTHER): Payer: 59 | Admitting: Endocrinology

## 2015-06-19 VITALS — BP 132/86 | HR 92 | Temp 98.4°F | Ht 61.5 in | Wt 165.4 lb

## 2015-06-19 DIAGNOSIS — M810 Age-related osteoporosis without current pathological fracture: Secondary | ICD-10-CM | POA: Diagnosis not present

## 2015-06-19 DIAGNOSIS — Z0000 Encounter for general adult medical examination without abnormal findings: Secondary | ICD-10-CM | POA: Diagnosis not present

## 2015-06-19 LAB — BASIC METABOLIC PANEL
BUN: 15 mg/dL (ref 6–23)
CHLORIDE: 105 meq/L (ref 96–112)
CO2: 28 mEq/L (ref 19–32)
Calcium: 9.7 mg/dL (ref 8.4–10.5)
Creatinine, Ser: 0.83 mg/dL (ref 0.40–1.20)
GFR: 76.21 mL/min (ref >60.00)
Glucose, Bld: 99 mg/dL (ref 70–99)
POTASSIUM: 4.1 meq/L (ref 3.5–5.1)
Sodium: 140 mEq/L (ref 135–145)

## 2015-06-19 LAB — LIPID PANEL
CHOL/HDL RATIO: 3
Cholesterol: 183 mg/dL (ref 0–200)
HDL: 69.2 mg/dL (ref >39.00)
LDL Cholesterol: 94 mg/dL (ref 0–99)
NONHDL: 113.42
TRIGLYCERIDES: 95 mg/dL (ref 0.0–149.0)
VLDL: 19 mg/dL (ref 0.0–40.0)

## 2015-06-19 LAB — CBC WITH DIFFERENTIAL/PLATELET
Basophils Absolute: 0 10*3/uL (ref 0.0–0.1)
Basophils Relative: 0.6 % (ref 0.0–3.0)
EOS ABS: 0.1 10*3/uL (ref 0.0–0.7)
Eosinophils Relative: 1.3 % (ref 0.0–5.0)
HEMATOCRIT: 45.5 % (ref 36.0–46.0)
Hemoglobin: 15.3 g/dL — ABNORMAL HIGH (ref 12.0–15.0)
LYMPHS ABS: 2.1 10*3/uL (ref 0.7–4.0)
LYMPHS PCT: 33.7 % (ref 12.0–46.0)
MCHC: 33.6 g/dL (ref 30.0–36.0)
MCV: 94.4 fl (ref 78.0–100.0)
Monocytes Absolute: 0.5 10*3/uL (ref 0.1–1.0)
Monocytes Relative: 8.8 % (ref 3.0–12.0)
NEUTROS ABS: 3.4 10*3/uL (ref 1.4–7.7)
Neutrophils Relative %: 55.6 % (ref 43.0–77.0)
PLATELETS: 299 10*3/uL (ref 150.0–400.0)
RBC: 4.82 Mil/uL (ref 3.87–5.11)
RDW: 13.6 % (ref 11.5–15.5)
WBC: 6.2 10*3/uL (ref 4.0–10.5)

## 2015-06-19 LAB — VITAMIN D 25 HYDROXY (VIT D DEFICIENCY, FRACTURES): VITD: 27.71 ng/mL — AB (ref 30.00–100.00)

## 2015-06-19 LAB — HEPATIC FUNCTION PANEL
ALBUMIN: 4.2 g/dL (ref 3.5–5.2)
ALT: 23 U/L (ref 0–35)
AST: 26 U/L (ref 0–37)
Alkaline Phosphatase: 56 U/L (ref 39–117)
Bilirubin, Direct: 0.1 mg/dL (ref 0.0–0.3)
TOTAL PROTEIN: 7.2 g/dL (ref 6.0–8.3)
Total Bilirubin: 0.4 mg/dL (ref 0.2–1.2)

## 2015-06-19 LAB — TSH: TSH: 4 u[IU]/mL (ref 0.35–4.50)

## 2015-06-19 NOTE — Patient Instructions (Signed)
blood tests are requested for you today.  We'll let you know about the results. Please have in infusion called "reclast."  you will receive a phone call, about a day and time for an appointment. You should get this each year. Take calcium 1200 mg per day, and vitamin-D, 400 units per day.

## 2015-06-19 NOTE — Progress Notes (Signed)
Subjective:    Patient ID: Paula Parker, female    DOB: 05-Sep-1960, 55 y.o.   MRN: [id number]  HPI Pt was noted to have osteoporosis in 2017.  She has never been on medication for this.  she has never had bony fracture (except for a finger, as a child).  She has no history of any of the following: early menopause, multiple myeloma, renal dz, thyroid problems, prolonged bedrest, steroids, and parathyroid dz.  She does not take heparin or anticonvulsants.  She quit smoking in early 2016, and stopped drinking EtOH many years ago.  She has slight cramps in the legs, but no assoc falls. Past Medical History  Diagnosis Date  . Alcoholic fatty liver   . GLAUCOMA, BORDERLINE   . HYPERLIPIDEMIA   . HYPERTENSION   . PALPITATIONS, OCCASIONAL   . SMOKER   . GERD (gastroesophageal reflux disease)   . Pseudomembranous colitis   . Hiatal hernia     Past Surgical History  Procedure Laterality Date  . Cervix lesion destruction  1980'S  . Ovary surgery      removed precancerous cells with a laser    Social History   Social History  . Marital Status: Married    Spouse Name: N/A  . Number of Children: 1  . Years of Education: N/A   Occupational History  .  [employer]   Social History Main Topics  . Smoking status: Former Smoker -- 1.00 packs/day    Types: Cigarettes    Quit date: 04/27/2014  . Smokeless tobacco: Never Used  . Alcohol Use: 0.0 oz/week    0 Standard drinks or equivalent per week     Comment: OCC  . Drug Use: No  . Sexual Activity: Not Currently   Other Topics Concern  . Not on file   Social History Narrative   ** Merged History Encounter **       Regular exercise yes    Current Outpatient Prescriptions on File Prior to Visit  Medication Sig Dispense Refill  . cetirizine (ZYRTEC) 10 MG tablet Take 1 tablet (10 mg total) by mouth daily. 30 tablet 11   No current facility-administered medications on file prior to visit.    Allergies  Allergen Reactions  .  Doxycycline Other (See Comments)    Throat swelling  . Benzonatate Rash and Other (See Comments)    unknown  . Penicillins Rash  . Sulfonamide Derivatives Rash and Other (See Comments)    unknown    Family History  Problem Relation Age of Onset  . Coronary artery disease Other     female first degree <50  . Ovarian cancer Sister 58  . Heart disease Sister   . Early death Sister   . Diabetes Maternal Grandfather   . ALS Mother   . Stroke Neg Hx   . Kidney disease Neg Hx   . Hypertension Neg Hx   . Hyperlipidemia Neg Hx   . Osteoporosis Neg Hx     BP 132/86 mmHg  Pulse 92  Temp(Src) 98.4 F (36.9 C) (Oral)  Ht 5' 1.5" (1.562 m)  Wt 165 lb 6.4 oz (75.025 kg)  BMI 30.75 kg/m2  SpO2 98%   Review of Systems denies hematuria, n/v, cold intolerance, skin rash, palpitations, cough, insomnia, memory loss, easy bruising, and rhinorrhea.  She has low-back pain.  She has weight gain, GERD, and leg edema.     Objective:   Physical Exam VS: see vs page GEN: no distress HEAD:  head: no deformity eyes: no periorbital swelling, no proptosis external nose and ears are normal mouth: no lesion seen NECK: supple, thyroid is not enlarged CHEST WALL: no deformity.  No kyphosis.   LUNGS:  Clear to auscultation CV: reg rate and rhythm, no murmur ABD: abdomen is soft, nontender.  no hepatosplenomegaly.  not distended.  no hernia.   MUSCULOSKELETAL: muscle bulk and strength are grossly normal.  no obvious joint swelling.  gait is normal and steady EXTEMITIES: no deformity.  no ulcer on the feet.  feet are of normal color and temp.  Trace bilat leg edema PULSES: dorsalis pedis intact bilat.  no carotid bruit NEURO:  cn 2-12 grossly intact.   readily moves all 4's.  sensation is intact to touch on the feet SKIN:  Normal texture and temperature.  No rash or suspicious lesion is visible.   NODES:  None palpable at the neck.   PSYCH: alert, well-oriented.  Does not appear anxious nor depressed.      Lab Results  Component Value Date   CALCIUM 9.6 01/22/2014   CAION 1.13 08/28/2007    Radiol: DEXA: Patient has OSTEOPOROSIS according to the [facility] classification    Assessment & Plan:  Osteoporosis, new to me.   GERD: she is not a candidate for fosamax.    Patient is advised the following: Patient Instructions  blood tests are requested for you today.  We'll let you know about the results. Please have in infusion called "reclast."  you will receive a phone call, about a day and time for an appointment. You should get this each year. Take calcium 1200 mg per day, and vitamin-D, 400 units per day.

## 2015-06-20 LAB — PTH, INTACT AND CALCIUM
CALCIUM: 9.7 mg/dL (ref 8.4–10.5)
PTH: 44 pg/mL (ref 14–64)

## 2015-06-23 LAB — PROTEIN ELECTROPHORESIS, SERUM
ALPHA-1-GLOBULIN: 0.3 g/dL (ref 0.2–0.3)
ALPHA-2-GLOBULIN: 0.9 g/dL (ref 0.5–0.9)
Albumin ELP: 4.1 g/dL (ref 3.8–4.8)
Beta 2: 0.3 g/dL (ref 0.2–0.5)
Beta Globulin: 0.5 g/dL (ref 0.4–0.6)
GAMMA GLOBULIN: 0.7 g/dL — AB (ref 0.8–1.7)
TOTAL PROTEIN, SERUM ELECTROPHOR: 6.6 g/dL (ref 6.1–8.1)

## 2015-12-17 ENCOUNTER — Telehealth: Payer: Self-pay | Admitting: Nutrition

## 2015-12-20 ENCOUNTER — Encounter (HOSPITAL_COMMUNITY): Payer: Self-pay

## 2015-12-20 ENCOUNTER — Emergency Department (HOSPITAL_COMMUNITY)
Admission: EM | Admit: 2015-12-20 | Discharge: 2015-12-20 | Disposition: A | Discharge status: Home / Self Care | Payer: 59 | Attending: Emergency Medicine | Admitting: Emergency Medicine

## 2015-12-20 ENCOUNTER — Emergency Department (HOSPITAL_COMMUNITY): Payer: 59

## 2015-12-20 DIAGNOSIS — Z87891 Personal history of nicotine dependence: Secondary | ICD-10-CM | POA: Insufficient documentation

## 2015-12-20 DIAGNOSIS — K5900 Constipation, unspecified: Secondary | ICD-10-CM | POA: Diagnosis not present

## 2015-12-20 DIAGNOSIS — K625 Hemorrhage of anus and rectum: Secondary | ICD-10-CM | POA: Insufficient documentation

## 2015-12-20 DIAGNOSIS — I10 Essential (primary) hypertension: Secondary | ICD-10-CM | POA: Insufficient documentation

## 2015-12-20 DIAGNOSIS — R1031 Right lower quadrant pain: Secondary | ICD-10-CM | POA: Insufficient documentation

## 2015-12-20 LAB — COMPREHENSIVE METABOLIC PANEL
ALT: 20 U/L (ref 14–54)
ANION GAP: 8 (ref 5–15)
AST: 25 U/L (ref 15–41)
Albumin: 3.8 g/dL (ref 3.5–5.0)
Alkaline Phosphatase: 50 U/L (ref 38–126)
BILIRUBIN TOTAL: 0.4 mg/dL (ref 0.3–1.2)
BUN: 16 mg/dL (ref 6–20)
CHLORIDE: 108 mmol/L (ref 101–111)
CO2: 23 mmol/L (ref 22–32)
Calcium: 9.2 mg/dL (ref 8.9–10.3)
Creatinine, Ser: 0.85 mg/dL (ref 0.44–1.00)
Glucose, Bld: 131 mg/dL — ABNORMAL HIGH (ref 65–99)
POTASSIUM: 3.7 mmol/L (ref 3.5–5.1)
Sodium: 139 mmol/L (ref 135–145)
TOTAL PROTEIN: 6.3 g/dL — AB (ref 6.5–8.1)

## 2015-12-20 LAB — URINE MICROSCOPIC-ADD ON
BACTERIA UA: NONE SEEN
RBC / HPF: NONE SEEN RBC/hpf (ref 0–5)

## 2015-12-20 LAB — URINALYSIS, ROUTINE W REFLEX MICROSCOPIC
Bilirubin Urine: NEGATIVE
GLUCOSE, UA: NEGATIVE mg/dL
Hgb urine dipstick: NEGATIVE
KETONES UR: NEGATIVE mg/dL
NITRITE: NEGATIVE
PH: 5 (ref 5.0–8.0)
Protein, ur: NEGATIVE mg/dL
Specific Gravity, Urine: 1.008 (ref 1.005–1.030)

## 2015-12-20 LAB — CBC
HEMATOCRIT: 44.4 % (ref 36.0–46.0)
HEMOGLOBIN: 14.1 g/dL (ref 12.0–15.0)
MCH: 31.3 pg (ref 26.0–34.0)
MCHC: 31.8 g/dL (ref 30.0–36.0)
MCV: 98.7 fL (ref 78.0–100.0)
Platelets: 260 10*3/uL (ref 150–400)
RBC: 4.5 MIL/uL (ref 3.87–5.11)
RDW: 13.1 % (ref 11.5–15.5)
WBC: 6.6 10*3/uL (ref 4.0–10.5)

## 2015-12-20 LAB — POC OCCULT BLOOD, ED: Fecal Occult Bld: NEGATIVE

## 2015-12-20 LAB — LIPASE, BLOOD: LIPASE: 54 U/L — AB (ref 11–51)

## 2015-12-20 MED ORDER — BISACODYL 5 MG PO TBEC: 5.0000 mg | DELAYED_RELEASE_TABLET | Freq: Every day | ORAL | 0 refills | Status: DC | PRN

## 2015-12-20 MED ORDER — DOCUSATE SODIUM 100 MG PO CAPS: 100.0000 mg | ORAL_CAPSULE | Freq: Two times a day (BID) | ORAL | 0 refills | Status: DC

## 2015-12-20 MED ORDER — IOPAMIDOL (ISOVUE-300) INJECTION 61%
INTRAVENOUS | Status: AC
  Administered 2015-12-20: 75 mL via INTRAVENOUS
  Filled 2015-12-20: qty 75

## 2015-12-20 MED ORDER — ONDANSETRON HCL 4 MG PO TABS: 4.0000 mg | ORAL_TABLET | Freq: Four times a day (QID) | ORAL | 0 refills | Status: DC

## 2015-12-20 MED ORDER — SODIUM CHLORIDE 0.9 % IV BOLUS (SEPSIS)
1000.0000 mL | Freq: Once | INTRAVENOUS | Status: AC
  Administered 2015-12-20: 1000 mL via INTRAVENOUS

## 2015-12-20 MED ORDER — ONDANSETRON HCL 4 MG/2ML IJ SOLN
4.0000 mg | Freq: Once | INTRAMUSCULAR | Status: AC
  Administered 2015-12-20: 4 mg via INTRAVENOUS
  Filled 2015-12-20: qty 2

## 2015-12-20 NOTE — ED Triage Notes (Signed)
Patient complains of constipation the past few days with abdominal pain. Last night had hard BM and this am ongoing pain and bright red blood noted mixed in stool. Alert and oriented, NAD. No hx of hemorrhoids.

## 2015-12-20 NOTE — Discharge Instructions (Addendum)
Medications: Colace, Dulcolax, Zofran  Treatment: Take Colace as prescribed to soften your stools. Take 1-3 tablets of Dulcolax once daily as needed to produce a bowel movement.  Take Zofran every 6 hours as needed for nausea and vomiting. Only take Tylenol as prescribed over-the-counter for pain.  Follow-up: Please follow up with gastroenterology for your colonoscopy and further evaluation and treatment of your rectal bleeding. Please follow up with your primary care provider as soon as possible for follow up of today's visit and for further evaluation of your intermittent lightheadedness. Please return to the emergency department if you develop any new or worsening symptoms.

## 2015-12-20 NOTE — ED Provider Notes (Signed)
[facility] DEPT Provider Note   CSN: [id number] Arrival date & time: 12/20/15  6948     History   Chief Complaint Chief Complaint  Patient presents with  . Rectal Bleeding    HPI Paula Parker is a 55 y.o. female with history of GERD, hiatal hernia, pseudomembranous colitis who presents with a one-day history of bright red blood per rectum and abdominal pain. Patient reports she had severe abdominal pain last evening that continues to mild to moderate today. Patient had a hard bowel movement with bright red blood within the bowel movement yesterday. Patient had another looser bowel movement this morning which also had bright red blood. Patient has had associated nausea, but no vomiting. Patient denies any fevers, pelvic pain, right abnormal vaginal bleeding or discharge, urinary symptoms. Patient denies taking any medications for this. Patient denies any history of hemorrhoids. Patient denies any chest pain, shortness of breath. Patient does endorse having intermittent lightheadedness with turning her head over the past 2 months. Patient reports taking Motrin more frequently over the past week due to a dental problem she is having. Patient states she normally does not take this because it "tears up her stomach." Patient menopausal.  HPI  Past Medical History:  Diagnosis Date  . Alcoholic fatty liver   . GERD (gastroesophageal reflux disease)   . GLAUCOMA, BORDERLINE   . Hiatal hernia   . HYPERLIPIDEMIA   . HYPERTENSION   . PALPITATIONS, OCCASIONAL   . Pseudomembranous colitis   . SMOKER     Patient Active Problem List   Diagnosis Date Noted  . Sinusitis, acute 04/02/2015  . Osteoporosis 12/05/2014  . Right lumbar radiculitis 12/05/2014  . Allergic rhinitis 07/19/2013  . GERD (gastroesophageal reflux disease) 04/17/2013  . Other screening mammogram 04/17/2013  . Atrophic vaginitis 02/03/2012  . Routine general medical examination at a health care facility 01/24/2012  .  Periodic health assessment, Pap and pelvic 01/24/2012  . SMOKER 04/28/2010  . Alcoholic fatty liver 54/62/7035  . GLAUCOMA, BORDERLINE 04/27/2010  . HIATAL HERNIA 04/27/2010  . HYPERLIPIDEMIA 04/16/2010  . Essential hypertension, benign 06/02/2007    Past Surgical History:  Procedure Laterality Date  . CERVIX LESION DESTRUCTION  1980'S  . OVARY SURGERY     removed precancerous cells with a laser    OB History    Gravida Para Term Preterm AB Living   3 1 1  0 2 1   SAB TAB Ectopic Multiple Live Births   2 0 0   1       Home Medications    Prior to Admission medications   Medication Sig Start Date End Date Taking? Authorizing Provider  bisacodyl (DULCOLAX) 5 MG EC tablet Take 1 tablet (5 mg total) by mouth daily as needed for moderate constipation. 12/20/15   Frederica Kuster, PA-C  cetirizine (ZYRTEC) 10 MG tablet Take 1 tablet (10 mg total) by mouth daily. 08/09/14   Biagio Borg, MD  docusate sodium (COLACE) 100 MG capsule Take 1 capsule (100 mg total) by mouth every 12 (twelve) hours. 12/20/15   Woody Kronberg M Lawrance Wiedemann, PA-C  ondansetron (ZOFRAN) 4 MG tablet Take 1 tablet (4 mg total) by mouth every 6 (six) hours. 12/20/15   Frederica Kuster, PA-C    Family History Family History  Problem Relation Age of Onset  . Ovarian cancer Sister 6  . Heart disease Sister   . Early death Sister   . ALS Mother   . Coronary artery disease Other  female first degree <50  . Diabetes Maternal Grandfather   . Stroke Neg Hx   . Kidney disease Neg Hx   . Hypertension Neg Hx   . Hyperlipidemia Neg Hx   . Osteoporosis Neg Hx     Social History Social History  Substance Use Topics  . Smoking status: Former Smoker    Packs/day: 1.00    Types: Cigarettes    Quit date: 04/27/2014  . Smokeless tobacco: Never Used  . Alcohol use 0.0 oz/week     Comment: OCC     Allergies   Doxycycline; Benzonatate; Penicillins; and Sulfonamide derivatives   Review of Systems Review of Systems    Constitutional: Negative for chills and fever.  HENT: Negative for facial swelling and sore throat.   Respiratory: Negative for shortness of breath.   Cardiovascular: Negative for chest pain.  Gastrointestinal: Positive for abdominal pain, blood in stool and nausea. Negative for vomiting.  Genitourinary: Negative for dysuria.  Musculoskeletal: Negative for back pain.  Skin: Negative for rash and wound.  Neurological: Positive for light-headedness. Negative for headaches.  Psychiatric/Behavioral: The patient is not nervous/anxious.      Physical Exam Updated Vital Signs BP 118/71   Pulse 70   Temp 98 F (36.7 C) (Oral)   Resp 15   Ht 5' 1"  (1.549 m)   Wt 75.8 kg   SpO2 96%   BMI 31.55 kg/m   Physical Exam  Constitutional: She appears well-developed and well-nourished. No distress.  HENT:  Head: Normocephalic and atraumatic.  Mouth/Throat: Oropharynx is clear and moist. No oropharyngeal exudate.  Eyes: Conjunctivae are normal. Pupils are equal, round, and reactive to light. Right eye exhibits no discharge. Left eye exhibits no discharge. No scleral icterus.  Neck: Normal range of motion. Neck supple. No thyromegaly present.  Cardiovascular: Normal rate, regular rhythm, normal heart sounds and intact distal pulses.  Exam reveals no gallop and no friction rub.   No murmur heard. Pulmonary/Chest: Effort normal and breath sounds normal. No stridor. No respiratory distress. She has no wheezes. She has no rales.  Abdominal: Soft. Bowel sounds are normal. She exhibits no distension. There is tenderness in the right lower quadrant and periumbilical area. There is tenderness at McBurney's point. There is no rebound and no guarding.  Genitourinary: Rectal exam shows external hemorrhoid (nontender, soft). Rectal exam shows anal tone normal and guaiac negative stool.  Musculoskeletal: She exhibits no edema.  Lymphadenopathy:    She has no cervical adenopathy.  Neurological: She is alert.  Coordination normal.  Skin: Skin is warm and dry. No rash noted. She is not diaphoretic. No pallor.  Psychiatric: She has a normal mood and affect.  Nursing note and vitals reviewed.    ED Treatments / Results  Labs (all labs ordered are listed, but only abnormal results are displayed) Labs Reviewed  LIPASE, BLOOD - Abnormal; Notable for the following:       Result Value   Lipase 54 (*)    All other components within normal limits  COMPREHENSIVE METABOLIC PANEL - Abnormal; Notable for the following:    Glucose, Bld 131 (*)    Total Protein 6.3 (*)    All other components within normal limits  URINALYSIS, ROUTINE W REFLEX MICROSCOPIC (NOT AT [facility]) - Abnormal; Notable for the following:    Leukocytes, UA TRACE (*)    All other components within normal limits  URINE MICROSCOPIC-ADD ON - Abnormal; Notable for the following:    Squamous Epithelial / LPF 6-30 (*)  All other components within normal limits  CBC  OCCULT BLOOD X 1 CARD TO LAB, STOOL  POC OCCULT BLOOD, ED    EKG  EKG Interpretation None       Radiology Ct Abdomen Pelvis W Contrast  Result Date: 12/20/2015 CLINICAL DATA:  Pt c/o being very constipated yesterday; pt began having abdominal discomfort this am., had a BM and saw blood in it. Pt states the blood was neither bright red, or dark red, but in between. Pt denies prior surgeries; pain only on palpation EXAM: CT ABDOMEN AND PELVIS WITH CONTRAST TECHNIQUE: Multidetector CT imaging of the abdomen and pelvis was performed using the standard protocol following bolus administration of intravenous contrast. CONTRAST:  1 ISOVUE-300 IOPAMIDOL (ISOVUE-300) INJECTION 61% COMPARISON:  None. FINDINGS: Lower chest: Mild atelectasis at the base of the left upper lobe lingula. Lung bases otherwise clear. Heart normal in size. Hepatobiliary: Sub cm low-density liver lesions, likely cysts. Liver otherwise unremarkable. There are gallstones. No gallbladder wall thickening or  evidence of acute cholecystitis. No bile duct dilation. Pancreas: Unremarkable. No pancreatic ductal dilatation or surrounding inflammatory changes. Spleen: Normal in size without focal abnormality. Adrenals/Urinary Tract: Adrenal glands are unremarkable. Kidneys are normal, without renal calculi, focal lesion, or hydronephrosis. Bladder is unremarkable. Stomach/Bowel: Normal stomach and small bowel. There are few left colon diverticula. No diverticulitis. Colon otherwise unremarkable. No significant increase colonic stool. Normal appendix visualized. Vascular/Lymphatic: No significant vascular findings are present. No enlarged abdominal or pelvic lymph nodes. Reproductive: Uterus and bilateral adnexa are unremarkable. Other: No abdominal wall hernia or abnormality. No abdominopelvic ascites. Musculoskeletal: No acute or significant osseous findings. IMPRESSION: 1. No acute findings. No significant increase in colonic stool. No bowel inflammation. 2. There are a few left colon diverticula.  No diverticulitis. 3. Sub cm low-density liver lesions that are likely cysts. 4. Gallstones.  No acute cholecystitis. 5. No other abnormalities. Electronically Signed   By: Lajean Manes M.D.   On: 12/20/2015 11:50    Procedures Procedures (including critical care time)  Medications Ordered in ED Medications  ondansetron (ZOFRAN) injection 4 mg (4 mg Intravenous Given 12/20/15 0919)  sodium chloride 0.9 % bolus 1,000 mL (0 mLs Intravenous Stopped 12/20/15 1140)  iopamidol (ISOVUE-300) 61 % injection (75 mLs Intravenous Contrast Given 12/20/15 1119)     Initial Impression / Assessment and Plan / ED Course  I have reviewed the triage vital signs and the nursing notes.  Pertinent labs & imaging results that were available during my care of the patient were reviewed by me and considered in my medical decision making (see chart for details).  Clinical Course   CBC unremarkable. CMP shows glucose 131, protein 6.3.  Lipase 54. UA shows trace leukocytes. Fecal occult negative. CT abdomen and pelvis shows no acute findings; no significant increase in colonic stool; no bowel inflammation; few left colon diverticula, no diverticulitis; subcentimeter low density liver lesions that are likely cysts; gallstones, no acute cholecystitis. Will treat patient with stool softener and laxative as needed. Patient also discharged with Zofran PRN. Follow-up to GI for further evaluation and colonoscopy. Return precautions discussed. Patient understands and agrees with plan. Patient vitals stable throughout ED course and discharged in satisfactory condition. I discussed patient case with Dr. Regenia Skeeter who guided the patient's management and agrees with plan.   Final Clinical Impressions(s) / ED Diagnoses   Final diagnoses:  Rectal bleeding    New Prescriptions New Prescriptions   BISACODYL (DULCOLAX) 5 MG EC TABLET  Take 1 tablet (5 mg total) by mouth daily as needed for moderate constipation.   DOCUSATE SODIUM (COLACE) 100 MG CAPSULE    Take 1 capsule (100 mg total) by mouth every 12 (twelve) hours.   ONDANSETRON (ZOFRAN) 4 MG TABLET    Take 1 tablet (4 mg total) by mouth every 6 (six) hours.     Frederica Kuster, PA-C 12/20/15 Gregory, MD 12/22/15 [phone]

## 2015-12-27 NOTE — Telephone Encounter (Signed)
message left on machine to let me know if she has insurance and what number to call to obtain an authorization

## 2015-12-27 NOTE — Telephone Encounter (Signed)
Opened in error

## 2016-01-06 MED FILL — HYDROCODON-APAP 7.5-325: 7.5-325 | 2 days supply | Qty: 8 | Fill #0

## 2016-01-15 ENCOUNTER — Ambulatory Visit: Payer: Self-pay | Admitting: Gastroenterology

## 2016-01-26 ENCOUNTER — Encounter: Payer: Self-pay | Admitting: Nurse Practitioner

## 2016-01-26 ENCOUNTER — Ambulatory Visit (INDEPENDENT_AMBULATORY_CARE_PROVIDER_SITE_OTHER): Payer: 59 | Admitting: Nurse Practitioner

## 2016-01-26 VITALS — BP 118/74 | HR 86 | Temp 98.7°F | Ht 60.0 in | Wt 168.0 lb

## 2016-01-26 DIAGNOSIS — M5441 Lumbago with sciatica, right side: Secondary | ICD-10-CM | POA: Diagnosis not present

## 2016-01-26 DIAGNOSIS — R350 Frequency of micturition: Secondary | ICD-10-CM

## 2016-01-26 LAB — POCT URINALYSIS DIPSTICK
BILIRUBIN UA: NEGATIVE
Blood, UA: NEGATIVE
Glucose, UA: NEGATIVE
Ketones, UA: NEGATIVE
LEUKOCYTES UA: NEGATIVE
NITRITE UA: NEGATIVE
PH UA: 6
PROTEIN UA: NEGATIVE
Spec Grav, UA: >=1.03
Urobilinogen, UA: 0.2

## 2016-01-26 MED ORDER — IBUPROFEN-FAMOTIDINE 800-26.6 MG PO TABS: 1.0000 | ORAL_TABLET | Freq: Two times a day (BID) | ORAL | 1 refills | Status: DC | PRN

## 2016-01-26 MED ORDER — CYCLOBENZAPRINE HCL 5 MG PO TABS: 5.0000 mg | ORAL_TABLET | Freq: Three times a day (TID) | ORAL | 0 refills | Status: DC | PRN

## 2016-01-26 MED ORDER — KETOROLAC TROMETHAMINE 30 MG/ML IJ SOLN: 30.0000 mg | Freq: Once | INTRAMUSCULAR | Status: DC

## 2016-01-26 NOTE — Progress Notes (Signed)
Pre visit review using our clinic review tool, if applicable. No additional management support is needed unless otherwise documented below in the visit note. 

## 2016-01-26 NOTE — Progress Notes (Signed)
Subjective:  Patient ID: Paula Parker, female    DOB: [birth year]  Age: 55 y.o. MRN: [id number]  CC: Back Pain (Pt stated back lower pain is been hurting for about 4 days.)   Back Pain  This is a new problem. The current episode started in the past 7 days. The problem occurs intermittently. The problem has been waxing and waning since onset. The pain is present in the lumbar spine. The quality of the pain is described as aching and cramping. The pain radiates to the right thigh. The pain is at a severity of 6/10. The pain is severe. The pain is the same all the time. The symptoms are aggravated by bending and twisting. Stiffness is present all day. Pertinent negatives include no abdominal pain, bladder incontinence, bowel incontinence, chest pain, dysuria, fever, headaches, leg pain, numbness, paresis, paresthesias, pelvic pain, perianal numbness, tingling, weakness or weight loss. (Has also noticed urinary frequency) Risk factors include poor posture and obesity. Paula Parker has tried NSAIDs for the symptoms. The treatment provided mild relief.    Outpatient Medications Prior to Visit  Medication Sig Dispense Refill  . bisacodyl (DULCOLAX) 5 MG EC tablet Take 1 tablet (5 mg total) by mouth daily as needed for moderate constipation. 20 tablet 0  . cetirizine (ZYRTEC) 10 MG tablet Take 1 tablet (10 mg total) by mouth daily. 30 tablet 11  . docusate sodium (COLACE) 100 MG capsule Take 1 capsule (100 mg total) by mouth every 12 (twelve) hours. 30 capsule 0  . ondansetron (ZOFRAN) 4 MG tablet Take 1 tablet (4 mg total) by mouth every 6 (six) hours. 12 tablet 0   No facility-administered medications prior to visit.     ROS See HPI  Objective:  BP 118/74 (BP Location: Left Arm, Patient Position: Sitting, Cuff Size: Normal)   Pulse 86   Temp 98.7 F (37.1 C)   Ht 5' (1.524 m)   Wt 168 lb (76.2 kg)   SpO2 95%   BMI 32.81 kg/m   BP Readings from Last 3 Encounters:  01/26/16 118/74  12/20/15 111/72   06/19/15 132/86    Wt Readings from Last 3 Encounters:  01/26/16 168 lb (76.2 kg)  12/20/15 167 lb (75.8 kg)  06/19/15 165 lb 6.4 oz (75 kg)    Physical Exam  Constitutional: Paula Parker is oriented to person, place, and time. No distress.  Cardiovascular: Normal rate.   Pulmonary/Chest: Effort normal.  Abdominal: Soft. Bowel sounds are normal. Paula Parker exhibits no distension. There is no tenderness. There is no guarding.  Musculoskeletal: Paula Parker exhibits tenderness. Paula Parker exhibits no edema.       Right hip: Normal.       Left hip: Normal.       Right knee: Normal.       Left knee: Normal.       Right ankle: Normal.       Left ankle: Normal.       Lumbar back: Paula Parker exhibits tenderness and pain. Paula Parker exhibits normal range of motion, no bony tenderness, no swelling, no edema, no spasm and normal pulse.  Neurological: Paula Parker is alert and oriented to person, place, and time. Paula Parker has normal reflexes. No cranial nerve deficit.  Skin: Skin is warm and dry. No rash noted. No erythema.  Vitals reviewed.   Lab Results  Component Value Date   WBC 6.6 12/20/2015   HGB 14.1 12/20/2015   HCT 44.4 12/20/2015   PLT 260 12/20/2015   GLUCOSE 131 (H) 12/20/2015  CHOL 183 06/19/2015   TRIG 95.0 06/19/2015   HDL 69.20 06/19/2015   LDLCALC 94 06/19/2015   ALT 20 12/20/2015   AST 25 12/20/2015   NA 139 12/20/2015   K 3.7 12/20/2015   CL 108 12/20/2015   CREATININE 0.85 12/20/2015   BUN 16 12/20/2015   CO2 23 12/20/2015   TSH 4.00 06/19/2015    Ct Abdomen Pelvis W Contrast  Result Date: 12/20/2015 CLINICAL DATA:  Pt c/o being very constipated yesterday; pt began having abdominal discomfort this am., had a BM and saw blood in it. Pt states the blood was neither bright red, or dark red, but in between. Pt denies prior surgeries; pain only on palpation EXAM: CT ABDOMEN AND PELVIS WITH CONTRAST TECHNIQUE: Multidetector CT imaging of the abdomen and pelvis was performed using the standard protocol following bolus  administration of intravenous contrast. CONTRAST:  1 ISOVUE-300 IOPAMIDOL (ISOVUE-300) INJECTION 61% COMPARISON:  None. FINDINGS: Lower chest: Mild atelectasis at the base of the left upper lobe lingula. Lung bases otherwise clear. Heart normal in size. Hepatobiliary: Sub cm low-density liver lesions, likely cysts. Liver otherwise unremarkable. There are gallstones. No gallbladder wall thickening or evidence of acute cholecystitis. No bile duct dilation. Pancreas: Unremarkable. No pancreatic ductal dilatation or surrounding inflammatory changes. Spleen: Normal in size without focal abnormality. Adrenals/Urinary Tract: Adrenal glands are unremarkable. Kidneys are normal, without renal calculi, focal lesion, or hydronephrosis. Bladder is unremarkable. Stomach/Bowel: Normal stomach and small bowel. There are few left colon diverticula. No diverticulitis. Colon otherwise unremarkable. No significant increase colonic stool. Normal appendix visualized. Vascular/Lymphatic: No significant vascular findings are present. No enlarged abdominal or pelvic lymph nodes. Reproductive: Uterus and bilateral adnexa are unremarkable. Other: No abdominal wall hernia or abnormality. No abdominopelvic ascites. Musculoskeletal: No acute or significant osseous findings. IMPRESSION: 1. No acute findings. No significant increase in colonic stool. No bowel inflammation. 2. There are a few left colon diverticula.  No diverticulitis. 3. Sub cm low-density liver lesions that are likely cysts. 4. Gallstones.  No acute cholecystitis. 5. No other abnormalities. Electronically Signed   By: Lajean Manes M.D.   On: 12/20/2015 11:50    Assessment & Plan:   Paula Parker was seen today for back pain.  Diagnoses and all orders for this visit:  Increased urinary frequency -     POCT urinalysis dipstick  Acute right-sided low back pain with right-sided sciatica -     Discontinue: ketorolac (TORADOL) 30 MG/ML injection 30 mg; Inject 1 mL (30 mg total)  into the muscle once. -     cyclobenzaprine (FLEXERIL) 5 MG tablet; Take 1 tablet (5 mg total) by mouth 3 (three) times daily as needed for muscle spasms. -     Ibuprofen-Famotidine (DUEXIS) 800-26.6 MG TABS; Take 1 tablet by mouth every 12 (twelve) hours as needed. With food   I am having Paula Parker start on cyclobenzaprine and Ibuprofen-Famotidine. I am also having her maintain her cetirizine, docusate sodium, bisacodyl, and ondansetron.  Meds ordered this encounter  Medications  . DISCONTD: ketorolac (TORADOL) 30 MG/ML injection 30 mg  . cyclobenzaprine (FLEXERIL) 5 MG tablet    Sig: Take 1 tablet (5 mg total) by mouth 3 (three) times daily as needed for muscle spasms.    Dispense:  30 tablet    Refill:  0    Order Specific Question:   Supervising Provider    Answer:   Cassandria Anger [[id number]]  . Ibuprofen-Famotidine (DUEXIS) 800-26.6 MG TABS    Sig:  Take 1 tablet by mouth every 12 (twelve) hours as needed. With food    Dispense:  30 tablet    Refill:  1    Order Specific Question:   Supervising Provider    Answer:   Cassandria Anger [[id number]]    Follow-up: Return if symptoms worsen or fail to improve.  Wilfred Lacy, NP

## 2016-01-26 NOTE — Patient Instructions (Signed)
Alternate between warm and cold compress as needed.  Back Exercises If you have pain in your back, do these exercises 2-3 times each day or as told by your doctor. When the pain goes away, do the exercises once each day, but repeat the steps more times for each exercise (do more repetitions). If you do not have pain in your back, do these exercises once each day or as told by your doctor. EXERCISES Single Knee to Chest Do these steps 3-5 times in a row for each leg: 1. Lie on your back on a firm bed or the floor with your legs stretched out. 2. Bring one knee to your chest. 3. Hold your knee to your chest by grabbing your knee or thigh. 4. Pull on your knee until you feel a gentle stretch in your lower back. 5. Keep doing the stretch for 10-30 seconds. 6. Slowly let go of your leg and straighten it. Pelvic Tilt Do these steps 5-10 times in a row: 1. Lie on your back on a firm bed or the floor with your legs stretched out. 2. Bend your knees so they point up to the ceiling. Your feet should be flat on the floor. 3. Tighten your lower belly (abdomen) muscles to press your lower back against the floor. This will make your tailbone point up to the ceiling instead of pointing down to your feet or the floor. 4. Stay in this position for 5-10 seconds while you gently tighten your muscles and breathe evenly. Cat-Cow Do these steps until your lower back bends more easily: 1. Get on your hands and knees on a firm surface. Keep your hands under your shoulders, and keep your knees under your hips. You may put padding under your knees. 2. Let your head hang down, and make your tailbone point down to the floor so your lower back is round like the back of a cat. 3. Stay in this position for 5 seconds. 4. Slowly lift your head and make your tailbone point up to the ceiling so your back hangs low (sags) like the back of a cow. 5. Stay in this position for 5 seconds. Press-Ups Do these steps 5-10 times in a  row: 1. Lie on your belly (face-down) on the floor. 2. Place your hands near your head, about shoulder-width apart. 3. While you keep your back relaxed and keep your hips on the floor, slowly straighten your arms to raise the top half of your body and lift your shoulders. Do not use your back muscles. To make yourself more comfortable, you may change where you place your hands. 4. Stay in this position for 5 seconds. 5. Slowly return to lying flat on the floor. Bridges Do these steps 10 times in a row: 1. Lie on your back on a firm surface. 2. Bend your knees so they point up to the ceiling. Your feet should be flat on the floor. 3. Tighten your butt muscles and lift your butt off of the floor until your waist is almost as high as your knees. If you do not feel the muscles working in your butt and the back of your thighs, slide your feet 1-2 inches farther away from your butt. 4. Stay in this position for 3-5 seconds. 5. Slowly lower your butt to the floor, and let your butt muscles relax. If this exercise is too easy, try doing it with your arms crossed over your chest. Belly Crunches Do these steps 5-10 times in a row:  1. Lie on your back on a firm bed or the floor with your legs stretched out. 2. Bend your knees so they point up to the ceiling. Your feet should be flat on the floor. 3. Cross your arms over your chest. 4. Tip your chin a little bit toward your chest but do not bend your neck. 5. Tighten your belly muscles and slowly raise your chest just enough to lift your shoulder blades a tiny bit off of the floor. 6. Slowly lower your chest and your head to the floor. Back Lifts Do these steps 5-10 times in a row: 1. Lie on your belly (face-down) with your arms at your sides, and rest your forehead on the floor. 2. Tighten the muscles in your legs and your butt. 3. Slowly lift your chest off of the floor while you keep your hips on the floor. Keep the back of your head in line with  the curve in your back. Look at the floor while you do this. 4. Stay in this position for 3-5 seconds. 5. Slowly lower your chest and your face to the floor. GET HELP IF:  Your back pain gets a lot worse when you do an exercise.  Your back pain does not lessen 2 hours after you exercise. If you have any of these problems, stop doing the exercises. Do not do them again unless your doctor says it is okay. GET HELP RIGHT AWAY IF:  You have sudden, very bad back pain. If this happens, stop doing the exercises. Do not do them again unless your doctor says it is okay.   This information is not intended to replace advice given to you by your health care provider. Make sure you discuss any questions you have with your health care provider.   Document Released: 04/24/2010 Document Revised: 12/11/2014 Document Reviewed: 05/16/2014 Elsevier Interactive Patient Education 2016 [employer] Sciatica Sciatica is pain, weakness, numbness, or tingling along your sciatic nerve. The nerve starts in the lower back and runs down the back of each leg. Nerve damage or certain conditions pinch or put pressure on the sciatic nerve. This causes the pain, weakness, and other discomforts of sciatica. HOME CARE   Only take medicine as told by your doctor.  Apply ice to the affected area for 20 minutes. Do this 3-4 times a day for the first 48-72 hours. Then try heat in the same way.  Exercise, stretch, or do your usual activities if these do not make your pain worse.  Go to physical therapy as told by your doctor.  Keep all doctor visits as told.  Do not wear high heels or shoes that are not supportive.  Get a firm mattress if your mattress is too soft to lessen pain and discomfort. GET HELP RIGHT AWAY IF:   You cannot control when you poop (bowel movement) or pee (urinate).  You have more weakness in your lower back, lower belly (pelvis), butt (buttocks), or legs.  You have redness or puffiness  (swelling) of your back.  You have a burning feeling when you pee.  You have pain that gets worse when you lie down.  You have pain that wakes you from your sleep.  Your pain is worse than past pain.  Your pain lasts longer than 4 weeks.  You are suddenly losing weight without reason. MAKE SURE YOU:   Understand these instructions.  Will watch this condition.  Will get help right away if you are not doing well or  get worse.   This information is not intended to replace advice given to you by your health care provider. Make sure you discuss any questions you have with your health care provider.   Document Released: 12/30/2007 Document Revised: 12/11/2014 Document Reviewed: 08/01/2011 Elsevier Interactive Patient Education [employer].

## 2016-01-27 ENCOUNTER — Telehealth: Payer: Self-pay

## 2016-01-27 NOTE — Telephone Encounter (Signed)
FMLA forms received, completed, and placed on NP desk for signature  Paperwork signed, faxed, copy sent to scan

## 2016-04-08 ENCOUNTER — Ambulatory Visit: Payer: Self-pay | Admitting: Internal Medicine

## 2016-04-13 ENCOUNTER — Ambulatory Visit: Payer: Self-pay | Admitting: Internal Medicine

## 2016-06-17 ENCOUNTER — Ambulatory Visit (INDEPENDENT_AMBULATORY_CARE_PROVIDER_SITE_OTHER): Payer: 59 | Admitting: Internal Medicine

## 2016-06-17 VITALS — BP 130/92 | HR 82 | Temp 98.4°F | Resp 16 | Ht 60.0 in | Wt 168.0 lb

## 2016-06-17 DIAGNOSIS — J0300 Acute streptococcal tonsillitis, unspecified: Secondary | ICD-10-CM | POA: Diagnosis not present

## 2016-06-17 MED ORDER — AZITHROMYCIN 500 MG PO TABS: 500.0000 mg | ORAL_TABLET | Freq: Every day | ORAL | 0 refills | Status: DC

## 2016-06-17 MED ORDER — HYDROCODONE-ACETAMINOPHEN 5-325 MG PO TABS: 1.0000 | ORAL_TABLET | Freq: Four times a day (QID) | ORAL | 0 refills | Status: DC | PRN

## 2016-06-17 MED FILL — AZITHROMYCIN 500 MG TABLET: 500 | 3 days supply | Qty: 3 | Fill #0

## 2016-06-17 MED FILL — HYDROCODON-APAP 5-325: 5-325 | 3 days supply | Qty: 15 | Fill #0

## 2016-06-17 NOTE — Progress Notes (Signed)
Pre visit review using our clinic review tool, if applicable. No additional management support is needed unless otherwise documented below in the visit note. 

## 2016-06-17 NOTE — Progress Notes (Signed)
Subjective:  Patient ID: Paula Parker, female    DOB: [birth year]  Age: 56 y.o. MRN: [id number]  CC: Sore Throat   HPI Paula Parker presents for A week history of scratchy throat, painful lymph nodes in her neck, earaches, and nonproductive cough. She has also noticed a white spot in her right tonsil. She is not having any trouble swallowing.  Outpatient Medications Prior to Visit  Medication Sig Dispense Refill  . cetirizine (ZYRTEC) 10 MG tablet Take 1 tablet (10 mg total) by mouth daily. 30 tablet 11  . bisacodyl (DULCOLAX) 5 MG EC tablet Take 1 tablet (5 mg total) by mouth daily as needed for moderate constipation. 20 tablet 0  . cyclobenzaprine (FLEXERIL) 5 MG tablet Take 1 tablet (5 mg total) by mouth 3 (three) times daily as needed for muscle spasms. 30 tablet 0  . docusate sodium (COLACE) 100 MG capsule Take 1 capsule (100 mg total) by mouth every 12 (twelve) hours. 30 capsule 0  . Ibuprofen-Famotidine (DUEXIS) 800-26.6 MG TABS Take 1 tablet by mouth every 12 (twelve) hours as needed. With food 30 tablet 1  . ondansetron (ZOFRAN) 4 MG tablet Take 1 tablet (4 mg total) by mouth every 6 (six) hours. 12 tablet 0   No facility-administered medications prior to visit.     ROS Review of Systems  Constitutional: Negative for chills, fatigue, fever and unexpected weight change.  HENT: Positive for sore throat. Negative for facial swelling, trouble swallowing and voice change.   Eyes: Negative for visual disturbance.  Respiratory: Positive for cough. Negative for chest tightness, shortness of breath, wheezing and stridor.   Cardiovascular: Negative for chest pain, palpitations and leg swelling.  Gastrointestinal: Negative for constipation, diarrhea, nausea and vomiting.  Endocrine: Negative.   Genitourinary: Negative.   Musculoskeletal: Negative.  Negative for neck pain.  Skin: Negative.  Negative for rash.  Allergic/Immunologic: Negative.   Neurological: Negative.   Hematological:  Negative.  Negative for adenopathy. Does not bruise/bleed easily.  Psychiatric/Behavioral: Negative.     Objective:  BP (!) 130/92 (BP Location: Left Arm, Patient Position: Sitting, Cuff Size: Large)   Pulse 82   Temp 98.4 F (36.9 C) (Oral)   Resp 16   Ht 5' (1.524 m)   Wt 168 lb (76.2 kg)   SpO2 96%   BMI 32.81 kg/m   BP Readings from Last 3 Encounters:  06/17/16 (!) 130/92  01/26/16 118/74  12/20/15 111/72    Wt Readings from Last 3 Encounters:  06/17/16 168 lb (76.2 kg)  01/26/16 168 lb (76.2 kg)  12/20/15 167 lb (75.8 kg)    Physical Exam  Constitutional:  Non-toxic appearance. She does not have a sickly appearance. She does not appear ill. No distress.  HENT:  Right Ear: Hearing, tympanic membrane, external ear and ear canal normal.  Left Ear: Hearing, tympanic membrane, external ear and ear canal normal.  Mouth/Throat: Mucous membranes are normal. Mucous membranes are not pale, not dry and not cyanotic. Oral lesions present. No trismus in the jaw. No uvula swelling. Posterior oropharyngeal erythema present. No oropharyngeal exudate, posterior oropharyngeal edema or tonsillar abscesses.  There is a tonsillar concretion noted on the right side but there is no asymmetry, mass, swelling, or exudate.  Eyes: Conjunctivae are normal. Right eye exhibits no discharge. Left eye exhibits no discharge. No scleral icterus.  Neck: Normal range of motion. Neck supple. No JVD present. No tracheal deviation present. No thyromegaly present.  Cardiovascular: Normal rate, regular rhythm, normal heart  sounds and intact distal pulses.  Exam reveals no gallop and no friction rub.   No murmur heard. Pulmonary/Chest: Effort normal and breath sounds normal. No stridor. No respiratory distress. She has no wheezes. She has no rales. She exhibits no tenderness.  Abdominal: Soft. Bowel sounds are normal. She exhibits no distension and no mass. There is no tenderness. There is no rebound and no  guarding.  Musculoskeletal: Normal range of motion. She exhibits no edema, tenderness or deformity.  Lymphadenopathy:    She has no cervical adenopathy.  Skin: Skin is warm and dry. No rash noted. She is not diaphoretic. No erythema. No pallor.  Vitals reviewed.   Lab Results  Component Value Date   WBC 6.6 12/20/2015   HGB 14.1 12/20/2015   HCT 44.4 12/20/2015   PLT 260 12/20/2015   GLUCOSE 131 (H) 12/20/2015   CHOL 183 06/19/2015   TRIG 95.0 06/19/2015   HDL 69.20 06/19/2015   LDLCALC 94 06/19/2015   ALT 20 12/20/2015   AST 25 12/20/2015   NA 139 12/20/2015   K 3.7 12/20/2015   CL 108 12/20/2015   CREATININE 0.85 12/20/2015   BUN 16 12/20/2015   CO2 23 12/20/2015   TSH 4.00 06/19/2015    Ct Abdomen Pelvis W Contrast  Result Date: 12/20/2015 CLINICAL DATA:  Pt c/o being very constipated yesterday; pt began having abdominal discomfort this am., had a BM and saw blood in it. Pt states the blood was neither bright red, or dark red, but in between. Pt denies prior surgeries; pain only on palpation EXAM: CT ABDOMEN AND PELVIS WITH CONTRAST TECHNIQUE: Multidetector CT imaging of the abdomen and pelvis was performed using the standard protocol following bolus administration of intravenous contrast. CONTRAST:  1 ISOVUE-300 IOPAMIDOL (ISOVUE-300) INJECTION 61% COMPARISON:  None. FINDINGS: Lower chest: Mild atelectasis at the base of the left upper lobe lingula. Lung bases otherwise clear. Heart normal in size. Hepatobiliary: Sub cm low-density liver lesions, likely cysts. Liver otherwise unremarkable. There are gallstones. No gallbladder wall thickening or evidence of acute cholecystitis. No bile duct dilation. Pancreas: Unremarkable. No pancreatic ductal dilatation or surrounding inflammatory changes. Spleen: Normal in size without focal abnormality. Adrenals/Urinary Tract: Adrenal glands are unremarkable. Kidneys are normal, without renal calculi, focal lesion, or hydronephrosis. Bladder is  unremarkable. Stomach/Bowel: Normal stomach and small bowel. There are few left colon diverticula. No diverticulitis. Colon otherwise unremarkable. No significant increase colonic stool. Normal appendix visualized. Vascular/Lymphatic: No significant vascular findings are present. No enlarged abdominal or pelvic lymph nodes. Reproductive: Uterus and bilateral adnexa are unremarkable. Other: No abdominal wall hernia or abnormality. No abdominopelvic ascites. Musculoskeletal: No acute or significant osseous findings. IMPRESSION: 1. No acute findings. No significant increase in colonic stool. No bowel inflammation. 2. There are a few left colon diverticula.  No diverticulitis. 3. Sub cm low-density liver lesions that are likely cysts. 4. Gallstones.  No acute cholecystitis. 5. No other abnormalities. Electronically Signed   By: Lajean Manes M.D.   On: 12/20/2015 11:50    Assessment & Plan:   Shonita was seen today for sore throat.  Diagnoses and all orders for this visit:  Acute non-recurrent streptococcal tonsillitis -     HYDROcodone-acetaminophen (NORCO/VICODIN) 5-325 MG tablet; Take 1 tablet by mouth every 6 (six) hours as needed for moderate pain. -     azithromycin (ZITHROMAX) 500 MG tablet; Take 1 tablet (500 mg total) by mouth daily.   I have discontinued Ms. Victorio's docusate sodium, bisacodyl, ondansetron, cyclobenzaprine,  and Ibuprofen-Famotidine. I am also having her start on HYDROcodone-acetaminophen and azithromycin. Additionally, I am having her maintain her cetirizine.  Meds ordered this encounter  Medications  . HYDROcodone-acetaminophen (NORCO/VICODIN) 5-325 MG tablet    Sig: Take 1 tablet by mouth every 6 (six) hours as needed for moderate pain.    Dispense:  15 tablet    Refill:  0  . azithromycin (ZITHROMAX) 500 MG tablet    Sig: Take 1 tablet (500 mg total) by mouth daily.    Dispense:  3 tablet    Refill:  0     Follow-up: Return if symptoms worsen or fail to  improve.  Scarlette Calico, MD

## 2016-06-17 NOTE — Patient Instructions (Signed)
Tonsillitis Tonsillitis is an infection of the throat that causes the tonsils to become red, tender, and swollen. Tonsils are collections of lymphoid tissue at the back of the throat. Each tonsil has crevices (crypts). Tonsils help fight nose and throat infections and keep infection from spreading to other parts of the body for the first 18 months of life. What are the causes? Sudden (acute) tonsillitis is usually caused by infection with streptococcal bacteria. Long-lasting (chronic) tonsillitis occurs when the crypts of the tonsils become filled with pieces of food and bacteria, which makes it easy for the tonsils to become repeatedly infected. What are the signs or symptoms? Symptoms of tonsillitis include:  A sore throat, with possible difficulty swallowing.  White patches on the tonsils.  Fever.  Tiredness.  New episodes of snoring during sleep, when you did not snore before.  Small, foul-smelling, yellowish-white pieces of material (tonsilloliths) that you occasionally cough up or spit out. The tonsilloliths can also cause you to have bad breath. How is this diagnosed? Tonsillitis can be diagnosed through a physical exam. Diagnosis can be confirmed with the results of lab tests, including a throat culture. How is this treated? The goals of tonsillitis treatment include the reduction of the severity and duration of symptoms and prevention of associated conditions. Symptoms of tonsillitis can be improved with the use of steroids to reduce the swelling. Tonsillitis caused by bacteria can be treated with antibiotic medicines. Usually, treatment with antibiotic medicines is started before the cause of the tonsillitis is known. However, if it is determined that the cause is not bacterial, antibiotic medicines will not treat the tonsillitis. If attacks of tonsillitis are severe and frequent, your health care provider may recommend surgery to remove the tonsils (tonsillectomy). Follow these  instructions at home:  Rest as much as possible and get plenty of sleep.  Drink plenty of fluids. While the throat is very sore, eat soft foods or liquids, such as sherbet, soups, or instant breakfast drinks.  Eat frozen ice pops.  Gargle with a warm or cold liquid to help soothe the throat. Mix 1/4 teaspoon of salt and 1/4 teaspoon of baking soda in 8 oz of water. Contact a health care provider if:  Large, tender lumps develop in your neck.  A rash develops.  A green, yellow-brown, or bloody substance is coughed up.  You are unable to swallow liquids or food for 24 hours.  You notice that only one of the tonsils is swollen. Get help right away if:  You develop any new symptoms such as vomiting, severe headache, stiff neck, chest pain, or trouble breathing or swallowing.  You have severe throat pain along with drooling or voice changes.  You have severe pain, unrelieved with recommended medications.  You are unable to fully open the mouth.  You develop redness, swelling, or severe pain anywhere in the neck.  You have a fever. This information is not intended to replace advice given to you by your health care provider. Make sure you discuss any questions you have with your health care provider. Document Released: 12/30/2004 Document Revised: 08/28/2015 Document Reviewed: 09/08/2012 Elsevier Interactive Patient Education  2017 [employer].

## 2016-06-18 ENCOUNTER — Encounter: Payer: Self-pay | Admitting: Internal Medicine

## 2016-06-19 ENCOUNTER — Other Ambulatory Visit: Payer: Self-pay | Admitting: Family Medicine

## 2016-06-19 MED ORDER — DEXTROMETHORPHAN POLISTIREX ER 30 MG/5ML PO SUER: 30.0000 mg | Freq: Four times a day (QID) | ORAL | 0 refills | Status: DC | PRN

## 2016-06-19 NOTE — Progress Notes (Signed)
After hours call: Pt with continued cough after treatment for tonsillitis , called in Delsym for her. She has tolerated this med in the past.

## 2016-06-22 ENCOUNTER — Ambulatory Visit: Payer: Self-pay | Admitting: Women's Health

## 2016-07-01 ENCOUNTER — Ambulatory Visit (INDEPENDENT_AMBULATORY_CARE_PROVIDER_SITE_OTHER): Payer: 59 | Admitting: Women's Health

## 2016-07-01 ENCOUNTER — Encounter: Payer: Self-pay | Admitting: Women's Health

## 2016-07-01 VITALS — BP 132/92 | Ht 61.0 in | Wt 168.8 lb

## 2016-07-01 DIAGNOSIS — Z01419 Encounter for gynecological examination (general) (routine) without abnormal findings: Secondary | ICD-10-CM | POA: Diagnosis not present

## 2016-07-01 DIAGNOSIS — Z1151 Encounter for screening for human papillomavirus (HPV): Secondary | ICD-10-CM

## 2016-07-01 LAB — HM PAP SMEAR

## 2016-07-01 NOTE — Patient Instructions (Addendum)
[facility]  Dr Carlean Purl  [phone]  Mammogram  [phone]  Vit D 2000 iu daily  Health Maintenance, Female Adopting a healthy lifestyle and getting preventive care can go a long way to promote health and wellness. Talk with your health care provider about what schedule of regular examinations is right for you. This is a good chance for you to check in with your provider about disease prevention and staying healthy. In between checkups, there are plenty of things you can do on your own. Experts have done a lot of research about which lifestyle changes and preventive measures are most likely to keep you healthy. Ask your health care provider for more information. Weight and diet Eat a healthy diet  Be sure to include plenty of vegetables, fruits, low-fat dairy products, and lean protein.  Do not eat a lot of foods high in solid fats, added sugars, or salt.  Get regular exercise. This is one of the most important things you can do for your health.  Most adults should exercise for at least 150 minutes each week. The exercise should increase your heart rate and make you sweat (moderate-intensity exercise).  Most adults should also do strengthening exercises at least twice a week. This is in addition to the moderate-intensity exercise. Maintain a healthy weight  Body mass index (BMI) is a measurement that can be used to identify possible weight problems. It estimates body fat based on height and weight. Your health care provider can help determine your BMI and help you achieve or maintain a healthy weight.  For females 82 years of age and older:  A BMI below 18.5 is considered underweight.  A BMI of 18.5 to 24.9 is normal.  A BMI of 25 to 29.9 is considered overweight.  A BMI of 30 and above is considered obese. Watch levels of cholesterol and blood lipids  You should start having your blood tested for lipids and cholesterol at 55 years of age, then have this test every 5 years.  You may  need to have your cholesterol levels checked more often if:  Your lipid or cholesterol levels are high.  You are older than 55 years of age.  You are at high risk for heart disease. Cancer screening Lung Cancer  Lung cancer screening is recommended for adults 65-45 years old who are at high risk for lung cancer because of a history of smoking.  A yearly low-dose CT scan of the lungs is recommended for people who:  Currently smoke.  Have quit within the past 15 years.  Have at least a 30-pack-year history of smoking. A pack year is smoking an average of one pack of cigarettes a day for 1 year.  Yearly screening should continue until it has been 15 years since you quit.  Yearly screening should stop if you develop a health problem that would prevent you from having lung cancer treatment. Breast Cancer  Practice breast self-awareness. This means understanding how your breasts normally appear and feel.  It also means doing regular breast self-exams. Let your health care provider know about any changes, no matter how small.  If you are in your 20s or 30s, you should have a clinical breast exam (CBE) by a health care provider every 1-3 years as part of a regular health exam.  If you are 59 or older, have a CBE every year. Also consider having a breast X-ray (mammogram) every year.  If you have a family history of breast cancer, talk to  your health care provider about genetic screening.  If you are at high risk for breast cancer, talk to your health care provider about having an MRI and a mammogram every year.  Breast cancer gene (BRCA) assessment is recommended for women who have family members with BRCA-related cancers. BRCA-related cancers include:  Breast.  Ovarian.  Tubal.  Peritoneal cancers.  Results of the assessment will determine the need for genetic counseling and BRCA1 and BRCA2 testing. Cervical Cancer  Your health care provider may recommend that you be  screened regularly for cancer of the pelvic organs (ovaries, uterus, and vagina). This screening involves a pelvic examination, including checking for microscopic changes to the surface of your cervix (Pap test). You may be encouraged to have this screening done every 3 years, beginning at age 26.  For women ages 57-65, health care providers may recommend pelvic exams and Pap testing every 3 years, or they may recommend the Pap and pelvic exam, combined with testing for human papilloma virus (HPV), every 5 years. Some types of HPV increase your risk of cervical cancer. Testing for HPV may also be done on women of any age with unclear Pap test results.  Other health care providers may not recommend any screening for nonpregnant women who are considered low risk for pelvic cancer and who do not have symptoms. Ask your health care provider if a screening pelvic exam is right for you.  If you have had past treatment for cervical cancer or a condition that could lead to cancer, you need Pap tests and screening for cancer for at least 20 years after your treatment. If Pap tests have been discontinued, your risk factors (such as having a new sexual partner) need to be reassessed to determine if screening should resume. Some women have medical problems that increase the chance of getting cervical cancer. In these cases, your health care provider may recommend more frequent screening and Pap tests. Colorectal Cancer  This type of cancer can be detected and often prevented.  Routine colorectal cancer screening usually begins at 55 years of age and continues through 55 years of age.  Your health care provider may recommend screening at an earlier age if you have risk factors for colon cancer.  Your health care provider may also recommend using home test kits to check for hidden blood in the stool.  A small camera at the end of a tube can be used to examine your colon directly (sigmoidoscopy or colonoscopy).  This is done to check for the earliest forms of colorectal cancer.  Routine screening usually begins at age 14.  Direct examination of the colon should be repeated every 5-10 years through 55 years of age. However, you may need to be screened more often if early forms of precancerous polyps or small growths are found. Skin Cancer  Check your skin from head to toe regularly.  Tell your health care provider about any new moles or changes in moles, especially if there is a change in a mole's shape or color.  Also tell your health care provider if you have a mole that is larger than the size of a pencil eraser.  Always use sunscreen. Apply sunscreen liberally and repeatedly throughout the day.  Protect yourself by wearing long sleeves, pants, a wide-brimmed hat, and sunglasses whenever you are outside. Heart disease, diabetes, and high blood pressure  High blood pressure causes heart disease and increases the risk of stroke. High blood pressure is more likely to develop in:  People who have blood pressure in the high end of the normal range (130-139/85-89 mm Hg).  People who are overweight or obese.  People who are African American.  If you are 59-75 years of age, have your blood pressure checked every 3-5 years. If you are 77 years of age or older, have your blood pressure checked every year. You should have your blood pressure measured twice-once when you are at a hospital or clinic, and once when you are not at a hospital or clinic. Record the average of the two measurements. To check your blood pressure when you are not at a hospital or clinic, you can use:  An automated blood pressure machine at a pharmacy.  A home blood pressure monitor.  If you are between 54 years and 67 years old, ask your health care provider if you should take aspirin to prevent strokes.  Have regular diabetes screenings. This involves taking a blood sample to check your fasting blood sugar level.  If you  are at a normal weight and have a low risk for diabetes, have this test once every three years after 55 years of age.  If you are overweight and have a high risk for diabetes, consider being tested at a younger age or more often. Preventing infection Hepatitis B  If you have a higher risk for hepatitis B, you should be screened for this virus. You are considered at high risk for hepatitis B if:  You were born in a country where hepatitis B is common. Ask your health care provider which countries are considered high risk.  Your parents were born in a high-risk country, and you have not been immunized against hepatitis B (hepatitis B vaccine).  You have HIV or AIDS.  You use needles to inject street drugs.  You live with someone who has hepatitis B.  You have had sex with someone who has hepatitis B.  You get hemodialysis treatment.  You take certain medicines for conditions, including cancer, organ transplantation, and autoimmune conditions. Hepatitis C  Blood testing is recommended for:  Everyone born from [birth year] through 1964.  Anyone with known risk factors for hepatitis C. Sexually transmitted infections (STIs)  You should be screened for sexually transmitted infections (STIs) including gonorrhea and chlamydia if:  You are sexually active and are younger than 55 years of age.  You are older than 55 years of age and your health care provider tells you that you are at risk for this type of infection.  Your sexual activity has changed since you were last screened and you are at an increased risk for chlamydia or gonorrhea. Ask your health care provider if you are at risk.  If you do not have HIV, but are at risk, it may be recommended that you take a prescription medicine daily to prevent HIV infection. This is called pre-exposure prophylaxis (PrEP). You are considered at risk if:  You are sexually active and do not regularly use condoms or know the HIV status of your  partner(s).  You take drugs by injection.  You are sexually active with a partner who has HIV. Talk with your health care provider about whether you are at high risk of being infected with HIV. If you choose to begin PrEP, you should first be tested for HIV. You should then be tested every 3 months for as long as you are taking PrEP. Pregnancy  If you are premenopausal and you may become pregnant, ask your health care provider about  preconception counseling.  If you may become pregnant, take 400 to 800 micrograms (mcg) of folic acid every day.  If you want to prevent pregnancy, talk to your health care provider about birth control (contraception). Osteoporosis and menopause  Osteoporosis is a disease in which the bones lose minerals and strength with aging. This can result in serious bone fractures. Your risk for osteoporosis can be identified using a bone density scan.  If you are 8 years of age or older, or if you are at risk for osteoporosis and fractures, ask your health care provider if you should be screened.  Ask your health care provider whether you should take a calcium or vitamin D supplement to lower your risk for osteoporosis.  Menopause may have certain physical symptoms and risks.  Hormone replacement therapy may reduce some of these symptoms and risks. Talk to your health care provider about whether hormone replacement therapy is right for you. Follow these instructions at home:  Schedule regular health, dental, and eye exams.  Stay current with your immunizations.  Do not use any tobacco products including cigarettes, chewing tobacco, or electronic cigarettes.  If you are pregnant, do not drink alcohol.  If you are breastfeeding, limit how much and how often you drink alcohol.  Limit alcohol intake to no more than 1 drink per day for nonpregnant women. One drink equals 12 ounces of beer, 5 ounces of wine, or 1 ounces of hard liquor.  Do not use street  drugs.  Do not share needles.  Ask your health care provider for help if you need support or information about quitting drugs.  Tell your health care provider if you often feel depressed.  Tell your health care provider if you have ever been abused or do not feel safe at home. This information is not intended to replace advice given to you by your health care provider. Make sure you discuss any questions you have with your health care provider. Document Released: 10/05/2010 Document Revised: 08/28/2015 Document Reviewed: 12/24/2014 Elsevier Interactive Patient Education  2017 [employer].  Denosumab injection What is this medicine? DENOSUMAB (den oh sue mab) slows bone breakdown. Prolia is used to treat osteoporosis in women after menopause and in men. Delton See is used to treat a high calcium level due to cancer and to prevent bone fractures and other bone problems caused by multiple myeloma or cancer bone metastases. Delton See is also used to treat giant cell tumor of the bone. This medicine may be used for other purposes; ask your health care provider or pharmacist if you have questions. COMMON BRAND NAME(S): Prolia, XGEVA What should I tell my health care provider before I take this medicine? They need to know if you have any of these conditions: -dental disease -having surgery or tooth extraction -infection -kidney disease -low levels of calcium or Vitamin D in the blood -malnutrition -on hemodialysis -skin conditions or sensitivity -thyroid or parathyroid disease -an unusual reaction to denosumab, other medicines, foods, dyes, or preservatives -pregnant or trying to get pregnant -breast-feeding How should I use this medicine? This medicine is for injection under the skin. It is given by a health care professional in a hospital or clinic setting. If you are getting Prolia, a special MedGuide will be given to you by the pharmacist with each prescription and refill. Be sure to read  this information carefully each time. For Prolia, talk to your pediatrician regarding the use of this medicine in children. Special care may be needed. For [place],  talk to your pediatrician regarding the use of this medicine in children. While this drug may be prescribed for children as Maruice Pieroni as 13 years for selected conditions, precautions do apply. Overdosage: If you think you have taken too much of this medicine contact a poison control center or emergency room at once. NOTE: This medicine is only for you. Do not share this medicine with others. What if I miss a dose? It is important not to miss your dose. Call your doctor or health care professional if you are unable to keep an appointment. What may interact with this medicine? Do not take this medicine with any of the following medications: -other medicines containing denosumab This medicine may also interact with the following medications: -medicines that lower your chance of fighting infection -steroid medicines like prednisone or cortisone This list may not describe all possible interactions. Give your health care provider a list of all the medicines, herbs, non-prescription drugs, or dietary supplements you use. Also tell them if you smoke, drink alcohol, or use illegal drugs. Some items may interact with your medicine. What should I watch for while using this medicine? Visit your doctor or health care professional for regular checks on your progress. Your doctor or health care professional may order blood tests and other tests to see how you are doing. Call your doctor or health care professional for advice if you get a fever, chills or sore throat, or other symptoms of a cold or flu. Do not treat yourself. This drug may decrease your body's ability to fight infection. Try to avoid being around people who are sick. You should make sure you get enough calcium and vitamin D while you are taking this medicine, unless your doctor tells you not  to. Discuss the foods you eat and the vitamins you take with your health care professional. See your dentist regularly. Brush and floss your teeth as directed. Before you have any dental work done, tell your dentist you are receiving this medicine. Do not become pregnant while taking this medicine or for 5 months after stopping it. Talk with your doctor or health care professional about your birth control options while taking this medicine. Women should inform their doctor if they wish to become pregnant or think they might be pregnant. There is a potential for serious side effects to an unborn child. Talk to your health care professional or pharmacist for more information. What side effects may I notice from receiving this medicine? Side effects that you should report to your doctor or health care professional as soon as possible: -allergic reactions like skin rash, itching or hives, swelling of the face, lips, or tongue -bone pain -breathing problems -dizziness -jaw pain, especially after dental work -redness, blistering, peeling of the skin -signs and symptoms of infection like fever or chills; cough; sore throat; pain or trouble passing urine -signs of low calcium like fast heartbeat, muscle cramps or muscle pain; pain, tingling, numbness in the hands or feet; seizures -unusual bleeding or bruising -unusually weak or tired Side effects that usually do not require medical attention (report to your doctor or health care professional if they continue or are bothersome): -constipation -diarrhea -headache -joint pain -loss of appetite -muscle pain -runny nose -tiredness -upset stomach This list may not describe all possible side effects. Call your doctor for medical advice about side effects. You may report side effects to FDA at 1-800-FDA-1088. Where should I keep my medicine? This medicine is only given in a clinic, doctor's office,  or other health care setting and will not be stored at  home. NOTE: This sheet is a summary. It may not cover all possible information. If you have questions about this medicine, talk to your doctor, pharmacist, or health care provider.  2018 Elsevier/Gold Standard (2016-04-13 19:17:21)

## 2016-07-01 NOTE — Progress Notes (Signed)
Paula Parker 1960/05/06 [id number]    History:    Presents for annual exam. Postmenopausal no bleeding on no HRT.    Denies menopausal symptoms other than vaginal dryness. Quit Smoking and no alchol for2 years.. Normal mammogram and pap history.  Colonoscopy  Overdue. Primary care manages hypertension and osteoporosis. 2017 T score -3.3 at spine, -2.8 at hip declined Prolia.  Past medical history, past surgical history, family history and social history were all reviewed and documented in the EPIC chart.. Works at [employer] in housekeeping. One daughter, 3 grandchildren.  ROS:  A ROS was performed and pertinent positives and negatives are included.  Exam:  Vitals:   07/01/16 1109  BP: (!) 132/92  Weight: 168 lb 12.8 oz (76.6 kg)  Height: 5' 1"  (1.549 m)   Body mass index is 31.89 kg/m.   General appearance:  Normal Thyroid:  Symmetrical, normal in size, without palpable masses or nodularity. Respiratory  Auscultation:  Clear without wheezing or rhonchi Cardiovascular  Auscultation:  Regular rate, without rubs, murmurs or gallops  Edema/varicosities:  Not grossly evident Abdominal  Soft,nontender, without masses, guarding or rebound.  Liver/spleen:  No organomegaly noted  Hernia:  None appreciated  Skin  Inspection:  Grossly normal   Breasts: Examined lying and sitting.     Right: Without masses, retractions, discharge or axillary adenopathy.     Left: Without masses, retractions, discharge or axillary adenopathy. Gentitourinary   Inguinal/mons:  Normal without inguinal adenopathy  External genitalia:  Normal  BUS/Urethra/Skene's glands:  Normal  Vagina:  atrophy  Cervix:  Normal pap with HR, HPV  Uterus:  , normal in size, shape and contour.  Midline and mobile  Adnexa/parametria:     Rt: Without masses or tenderness.   Lt: Without masses or tenderness.  Anus and perineum: Normal  Digital rectal exam: Normal sphincter tone without palpated masses or  tenderness  Assessment/Plan:  56 y.o. MWF G3P1  for annual exam.  No complaints  Postmenopausal, no beeding, no HRT Hypertension and lab work managed by primary care Labs and meds Osteopenia persists on no medication  Plan: Encouraged to schedule a mammogram, colonoscopy and DEXA.  [facility]'s annual mammogram, active lifestyle, vit. D 2000 U daily [place] attached. Pap with HR, HPV. Reviewed Prolia, oral bisphosphonates for treatment of osteoporosis declines at this time will think about. Home safety, fall prevention and importance of weightbearing exercise reviewed.   Huel Cote [facility], 11:58 AM 07/01/2016

## 2016-07-01 NOTE — Addendum Note (Signed)
Addended by: Thamas Jaegers on: 07/01/2016 12:19 PM   Modules accepted: Orders

## 2016-07-06 LAB — PAP, TP IMAGING W/ HPV RNA, RFLX HPV TYPE 16,18/45: HPV MRNA, HIGH RISK: NOT DETECTED

## 2016-07-15 ENCOUNTER — Ambulatory Visit (INDEPENDENT_AMBULATORY_CARE_PROVIDER_SITE_OTHER)
Admission: RE | Admit: 2016-07-15 | Discharge: 2016-07-15 | Disposition: A | Discharge status: Home / Self Care | Payer: 59 | Source: Ambulatory Visit | Attending: Internal Medicine | Admitting: Internal Medicine

## 2016-07-15 ENCOUNTER — Ambulatory Visit (INDEPENDENT_AMBULATORY_CARE_PROVIDER_SITE_OTHER): Payer: 59 | Admitting: Internal Medicine

## 2016-07-15 ENCOUNTER — Encounter: Payer: Self-pay | Admitting: Internal Medicine

## 2016-07-15 VITALS — BP 130/90 | HR 89 | Temp 98.2°F | Resp 16 | Ht 61.0 in | Wt 171.0 lb

## 2016-07-15 DIAGNOSIS — M7732 Calcaneal spur, left foot: Secondary | ICD-10-CM | POA: Diagnosis not present

## 2016-07-15 DIAGNOSIS — M79672 Pain in left foot: Secondary | ICD-10-CM | POA: Insufficient documentation

## 2016-07-15 DIAGNOSIS — M19072 Primary osteoarthritis, left ankle and foot: Secondary | ICD-10-CM | POA: Diagnosis not present

## 2016-07-15 MED ORDER — CETIRIZINE HCL 10 MG PO TABS: 10.0000 mg | ORAL_TABLET | Freq: Every day | ORAL | 11 refills | Status: DC

## 2016-07-15 MED ORDER — METHYLPREDNISOLONE 4 MG PO TBPK: ORAL_TABLET | ORAL | 0 refills | Status: DC

## 2016-07-15 NOTE — Progress Notes (Signed)
Subjective:  Patient ID: Paula Parker, female    DOB: October 06, 1960  Age: 56 y.o. MRN: [id number]  CC: Foot Pain   HPI Paula Parker presents for a 1 week hx of non-traumatic left foot pain, the pain is localized to the dorsum of all 5 left MTP joints with mild swelling over the 2nd and 3rd MTP joints. She has taken Tylenol and Motrin for symptom relief.  Outpatient Medications Prior to Visit  Medication Sig Dispense Refill  . cetirizine (ZYRTEC) 10 MG tablet Take 1 tablet (10 mg total) by mouth daily. 30 tablet 11  . azithromycin (ZITHROMAX) 500 MG tablet Take 1 tablet (500 mg total) by mouth daily. (Patient not taking: Reported on 07/01/2016) 3 tablet 0  . dextromethorphan (DELSYM) 30 MG/5ML liquid Take 5 mLs (30 mg total) by mouth every 6 (six) hours as needed for cough. (Patient not taking: Reported on 07/01/2016) 148 mL 0  . HYDROcodone-acetaminophen (NORCO/VICODIN) 5-325 MG tablet Take 1 tablet by mouth every 6 (six) hours as needed for moderate pain. (Patient not taking: Reported on 07/01/2016) 15 tablet 0   No facility-administered medications prior to visit.     ROS Review of Systems  Constitutional: Negative for chills and fever.  HENT: Negative.   Eyes: Negative.   Respiratory: Negative.   Cardiovascular: Negative.   Gastrointestinal: Negative.   Genitourinary: Negative.   Musculoskeletal: Positive for arthralgias. Negative for back pain and neck pain.  Skin: Negative for color change, rash and wound.  Allergic/Immunologic: Negative.   Neurological: Negative.   Hematological: Negative for adenopathy. Does not bruise/bleed easily.  Psychiatric/Behavioral: Negative.     Objective:  BP 130/90 (BP Location: Left Arm, Patient Position: Sitting, Cuff Size: Normal)   Pulse 89   Temp 98.2 F (36.8 C) (Oral)   Resp 16   Ht 5' 1"  (1.549 m)   Wt 171 lb (77.6 kg)   SpO2 98%   BMI 32.31 kg/m   BP Readings from Last 3 Encounters:  07/15/16 130/90  07/01/16 (!) 132/92    06/17/16 (!) 130/92    Wt Readings from Last 3 Encounters:  07/15/16 171 lb (77.6 kg)  07/01/16 168 lb 12.8 oz (76.6 kg)  06/17/16 168 lb (76.2 kg)    Physical Exam  Musculoskeletal:       Left foot: There is tenderness and deformity. There is normal range of motion, no bony tenderness, no swelling, normal capillary refill, no crepitus and no laceration.       Feet:    Lab Results  Component Value Date   WBC 6.6 12/20/2015   HGB 14.1 12/20/2015   HCT 44.4 12/20/2015   PLT 260 12/20/2015   GLUCOSE 131 (H) 12/20/2015   CHOL 183 06/19/2015   TRIG 95.0 06/19/2015   HDL 69.20 06/19/2015   LDLCALC 94 06/19/2015   ALT 20 12/20/2015   AST 25 12/20/2015   NA 139 12/20/2015   K 3.7 12/20/2015   CL 108 12/20/2015   CREATININE 0.85 12/20/2015   BUN 16 12/20/2015   CO2 23 12/20/2015   TSH 4.00 06/19/2015    Ct Abdomen Pelvis W Contrast  Result Date: 12/20/2015 CLINICAL DATA:  Pt c/o being very constipated yesterday; pt began having abdominal discomfort this am., had a BM and saw blood in it. Pt states the blood was neither bright red, or dark red, but in between. Pt denies prior surgeries; pain only on palpation EXAM: CT ABDOMEN AND PELVIS WITH CONTRAST TECHNIQUE: Multidetector CT imaging of the  abdomen and pelvis was performed using the standard protocol following bolus administration of intravenous contrast. CONTRAST:  1 ISOVUE-300 IOPAMIDOL (ISOVUE-300) INJECTION 61% COMPARISON:  None. FINDINGS: Lower chest: Mild atelectasis at the base of the left upper lobe lingula. Lung bases otherwise clear. Heart normal in size. Hepatobiliary: Sub cm low-density liver lesions, likely cysts. Liver otherwise unremarkable. There are gallstones. No gallbladder wall thickening or evidence of acute cholecystitis. No bile duct dilation. Pancreas: Unremarkable. No pancreatic ductal dilatation or surrounding inflammatory changes. Spleen: Normal in size without focal abnormality. Adrenals/Urinary Tract:  Adrenal glands are unremarkable. Kidneys are normal, without renal calculi, focal lesion, or hydronephrosis. Bladder is unremarkable. Stomach/Bowel: Normal stomach and small bowel. There are few left colon diverticula. No diverticulitis. Colon otherwise unremarkable. No significant increase colonic stool. Normal appendix visualized. Vascular/Lymphatic: No significant vascular findings are present. No enlarged abdominal or pelvic lymph nodes. Reproductive: Uterus and bilateral adnexa are unremarkable. Other: No abdominal wall hernia or abnormality. No abdominopelvic ascites. Musculoskeletal: No acute or significant osseous findings. IMPRESSION: 1. No acute findings. No significant increase in colonic stool. No bowel inflammation. 2. There are a few left colon diverticula.  No diverticulitis. 3. Sub cm low-density liver lesions that are likely cysts. 4. Gallstones.  No acute cholecystitis. 5. No other abnormalities. Electronically Signed   By: Lajean Manes M.D.   On: 12/20/2015 11:50   Dg Foot Complete Left  Result Date: 07/15/2016 CLINICAL DATA:  Pain and swelling over the last 7 days. EXAM: LEFT FOOT - COMPLETE 3+ VIEW COMPARISON:  None. FINDINGS: Mild hallux valgus deformity of the great toe. Very small calcaneal spurs. Otherwise, the examination is normal. IMPRESSION: Mild hallux valgus deformity of the great toe. Very small calcaneal spurs. Electronically Signed   By: Nelson Chimes M.D.   On: 07/15/2016 14:59     Assessment & Plan:   [name] was seen today for foot pain.  Diagnoses and all orders for this visit:  Acute foot pain, left- The x-ray shows a mild hallux valgus deformity of the great toe but the remainder of the MTP joints are normal. She is made aware that she has a bunion and is considering whether or not she wants to consider surgical repair. -     DG Foot Complete Left; Future  DJD (degenerative joint disease), ankle and foot, left- she will continue taking anti-inflammatories for  pain but will also add a course of systemic steroids to relieve her discomfort. -     DG Foot Complete Left; Future -     methylPREDNISolone (MEDROL DOSEPAK) 4 MG TBPK tablet; TAKE AS DIRECTED  Other orders -     cetirizine (ZYRTEC) 10 MG tablet; Take 1 tablet (10 mg total) by mouth daily.   I have discontinued Paula Parker's HYDROcodone-acetaminophen, azithromycin, and dextromethorphan. I am also having her start on methylPREDNISolone. Additionally, I am having her maintain her cetirizine.  Meds ordered this encounter  Medications  . cetirizine (ZYRTEC) 10 MG tablet    Sig: Take 1 tablet (10 mg total) by mouth daily.    Dispense:  30 tablet    Refill:  11  . methylPREDNISolone (MEDROL DOSEPAK) 4 MG TBPK tablet    Sig: TAKE AS DIRECTED    Dispense:  21 tablet    Refill:  0     Follow-up: Return in about 3 weeks (around 08/05/2016).  Scarlette Calico, MD

## 2016-07-15 NOTE — Progress Notes (Signed)
Pre visit review using our clinic review tool, if applicable. No additional management support is needed unless otherwise documented below in the visit note. 

## 2016-07-15 NOTE — Patient Instructions (Signed)
Arthritis Arthritis means joint pain. It can also mean joint disease. A joint is a place where bones come together. People who have arthritis may have:  Red joints.  Swollen joints.  Stiff joints.  Warm joints.  A fever.  A feeling of being sick. Follow these instructions at home: Pay attention to any changes in your symptoms. Take these actions to help with your pain and swelling. Medicines   Take over-the-counter and prescription medicines only as told by your doctor.  Do not take aspirin for pain if your doctor says that you may have gout. Activity   Rest your joint if your doctor tells you to.  Avoid activities that make the pain worse.  Exercise your joint regularly as told by your doctor. Try doing exercises like:  Swimming.  Water aerobics.  Biking.  Walking. Joint Care    If your joint is swollen, keep it raised (elevated) if told by your doctor.  If your joint feels stiff in the morning, try taking a warm shower.  If you have diabetes, do not apply heat without asking your doctor.  If told, apply heat to the joint:  Put a towel between the joint and the hot pack or heating pad.  Leave the heat on the area for 20-30 minutes.  If told, apply ice to the joint:  Put ice in a plastic bag.  Place a towel between your skin and the bag.  Leave the ice on for 20 minutes, 2-3 times per day.  Keep all follow-up visits as told by your doctor. Contact a doctor if:  The pain gets worse.  You have a fever. Get help right away if:  You have very bad pain in your joint.  You have swelling in your joint.  Your joint is red.  Many joints become painful and swollen.  You have very bad back pain.  Your leg is very weak.  You cannot control your pee (urine) or poop (stool). This information is not intended to replace advice given to you by your health care provider. Make sure you discuss any questions you have with your health care  provider. Document Released: 06/16/2009 Document Revised: 08/28/2015 Document Reviewed: 06/17/2014 Elsevier Interactive Patient Education  2017 [employer].

## 2016-07-16 ENCOUNTER — Encounter: Payer: Self-pay | Admitting: Internal Medicine

## 2016-07-20 ENCOUNTER — Other Ambulatory Visit: Payer: Self-pay | Admitting: Internal Medicine

## 2016-07-20 ENCOUNTER — Encounter: Payer: Self-pay | Admitting: Internal Medicine

## 2016-07-20 DIAGNOSIS — M2012 Hallux valgus (acquired), left foot: Secondary | ICD-10-CM | POA: Insufficient documentation

## 2016-08-11 ENCOUNTER — Ambulatory Visit: Payer: 59 | Admitting: Podiatry

## 2016-08-12 ENCOUNTER — Encounter: Payer: Self-pay | Admitting: Podiatry

## 2016-08-12 ENCOUNTER — Ambulatory Visit (INDEPENDENT_AMBULATORY_CARE_PROVIDER_SITE_OTHER): Payer: 59

## 2016-08-12 ENCOUNTER — Ambulatory Visit (INDEPENDENT_AMBULATORY_CARE_PROVIDER_SITE_OTHER): Payer: 59 | Admitting: Podiatry

## 2016-08-12 VITALS — BP 160/97 | HR 87 | Resp 16 | Ht 61.0 in | Wt 173.0 lb

## 2016-08-12 DIAGNOSIS — M2010 Hallux valgus (acquired), unspecified foot: Secondary | ICD-10-CM | POA: Diagnosis not present

## 2016-08-12 DIAGNOSIS — M779 Enthesopathy, unspecified: Secondary | ICD-10-CM

## 2016-08-12 MED ORDER — TRIAMCINOLONE ACETONIDE 10 MG/ML IJ SUSP
10.0000 mg | Freq: Once | INTRAMUSCULAR | Status: AC
  Administered 2016-08-12: 10 mg

## 2016-08-12 NOTE — Progress Notes (Signed)
   Subjective:    Patient ID: Paula Parker, female    DOB: 1961/02/10, 56 y.o.   MRN: [id number]  HPI Chief Complaint  Patient presents with  . Foot Pain    Bilateral; bunions; Left foot-dorsal, swelling; pt stated, "Left foot hurts more"; x4-5 weeks  . Toe Pain    Left foot; great toe; pt stated, "Broke big toe 16 years ago; can't bend toe"      Review of Systems  HENT: Positive for sinus pain, sneezing and tinnitus.   Eyes: Positive for itching.  Cardiovascular: Positive for leg swelling.  Gastrointestinal: Positive for abdominal distention.  Musculoskeletal: Positive for gait problem.  Allergic/Immunologic: Positive for food allergies.  Neurological: Positive for headaches.  All other systems reviewed and are negative.      Objective:   Physical Exam        Assessment & Plan:

## 2016-08-12 NOTE — Progress Notes (Signed)
Subjective:    Patient ID: Paula Parker, female   DOB: 56 y.o.   MRN: [id number]   HPI patient presents stating she's getting a lot of pain on the top of her left foot and she has a significant bunion that continues to bother her left over right and is tried wider shoes has tried padding and soak them without relief and that like to get that corrected next month if possible    Review of Systems  All other systems reviewed and are negative.       Objective:  Physical Exam  Cardiovascular: Intact distal pulses.   Musculoskeletal: Normal range of motion.  Neurological: She is alert. Abnormal reflex: neurovascular status intact muscle strength adequate range of motion within normal limits with patient found to have exquisite inflammation and pain second metatarsophalangeal joint left with mild edema and is noted to have large hyperostosis medial aspect.  Skin: Skin is warm.  Nursing note and vitals reviewed.  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 At this time, to focus on the inflamed capsule second MPJ and I did a proximal nerve block aspirated the joint getting out a small amount of clear fluid and injected quarter cc deck Boulier some Kenalog and applied thick plantar padding. I then educated her on bunion correction and this can be done in a surgical setting and will be scheduled for next month and we see her back in 2 weeks  X-ray indicates her significant elevation of the intermetatarsal angle of approximately 16 bilateral with inflammation around the second MPJ left with no indication of stress fracture or arthritis

## 2016-08-12 NOTE — Patient Instructions (Signed)
Bunionectomy A bunionectomy is a surgical procedure to remove a bunion. A bunion is a visible bump of bone on the inside of your foot where your big toe meets the rest of your foot. A bunion can develop when pressure turns this bone (first metatarsal) toward the other toes. Shoes that are too tight are the most common cause of bunions. Bunions can also be caused by diseases, such as arthritis and polio. You may need a bunionectomy if your bunion is very large and painful or it affects your ability to walk. Tell a health care provider about:  Any allergies you have.  All medicines you are taking, including vitamins, herbs, eye drops, creams, and over-the-counter medicines.  Any problems you or family members have had with anesthetic medicines.  Any blood disorders you have.  Any surgeries you have had.  Any medical conditions you have. What are the risks? Generally, this is a safe procedure. However, problems may occur, including:  Infection.  Pain.  Nerve damage.  Bleeding or blood clots.  Reactions to medicines.  Numbness, stiffness, or arthritis in your toe.  Foot problems that continue even after the procedure. What happens before the procedure?  Ask your health care provider about:  Changing or stopping your regular medicines. This is especially important if you are taking diabetes medicines or blood thinners.  Taking medicines such as aspirin and ibuprofen. These medicines can thin your blood. Do not take these medicines before your procedure if your health care provider instructs you not to.  Do not drink alcohol before the procedure as directed by your health care provider.  Do not use tobacco products, including cigarettes, chewing tobacco, or electronic cigarettes, before the procedure as directed by your health care provider. If you need help quitting, ask your health care provider.  Ask your health care provider what kind of medicine you will be given during  your procedure. A bunionectomy may be done using one of these:  A medicine that numbs the area (local anesthetic).  A medicine that makes you go to sleep (general anesthetic). If you will be given general anesthetic, do not eat or drink anything after midnight on the night before the procedure or as directed by your health care provider. What happens during the procedure?  An IV tube may be inserted into a vein.  You will be given local anesthetic or general anesthetic.  The surgeon will make a cut (incision) over the enlarged area at the first joint of the big toe. The surgeon will remove the bunion.  You may have more than one incision if any of the bones in your big toe need to be moved. A bone itself may need to be cut.  Sometimes the tissues around the big toe may also need to be cut then tightened or loosened to reposition the toe.  Screws or other hardware may be used to keep your foot in thecorrect position.  The incision will be closed with stitches (sutures) and covered with adhesive strips or another type of bandage (dressing). What happens after the procedure?  You may spend some time in a recovery area.  Your blood pressure, heart rate, breathing rate, and blood oxygen level will be monitored often until the medicines you were given have worn off. This information is not intended to replace advice given to you by your health care provider. Make sure you discuss any questions you have with your health care provider. Document Released: 03/05/2005 Document Revised: 08/28/2015 Document Reviewed: 11/07/2013   Elsevier Interactive Patient Education  2017 [employer] Inc[employer]  

## 2016-08-26 ENCOUNTER — Ambulatory Visit (INDEPENDENT_AMBULATORY_CARE_PROVIDER_SITE_OTHER): Payer: 59 | Admitting: Podiatry

## 2016-08-26 DIAGNOSIS — M2010 Hallux valgus (acquired), unspecified foot: Secondary | ICD-10-CM

## 2016-08-26 DIAGNOSIS — M779 Enthesopathy, unspecified: Secondary | ICD-10-CM

## 2016-08-26 NOTE — Progress Notes (Signed)
Subjective:    Patient ID: Paula Parker, female   DOB: 56 y.o.   MRN: [id number]   HPI patient presents stating I'm ready to get the bunion fixed on my left foot as it's been sore and the other areas doing much better    ROS      Objective:  Physical Exam Neurovascular status intact with structural bunion deformity left that red and structural bunion deformity right that's red with pain when palpated and inability to get better with wider shoes softer type material soaks and soaks. 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 I've recommended distal osteotomy left explaining we may not get complete radiographic correction but should do very well clinically and solve the problem of her pain. She wants surgery and at this time I allowed her to read consent form going over alternative treatments complications and everything else as listed. Patient is willing to accept risk of surgery wants surgery and signs consent form understanding recovery of a complete nature can take 6 months to one year and is dispensed air fracture walker with all instructions on usage for the postoperative period and I do want her getting used to it prior to surgery and wearing a shoe on the other foot to balance her better

## 2016-08-26 NOTE — Patient Instructions (Signed)
Pre-Operative Instructions  Congratulations, you have decided to take an important step to improving your quality of life.  You can be assured that the doctors of [facility] will be with you every step of the way.  1. Plan to be at the surgery center/hospital at least 1 (one) hour prior to your scheduled time unless otherwise directed by the surgical center/hospital staff.  You must have a responsible adult accompany you, remain during the surgery and drive you home.  Make sure you have directions to the surgical center/hospital and know how to get there on time. 2. For hospital based surgery you will need to obtain a history and physical form from your family physician within 1 month prior to the date of surgery- we will give you a form for you primary physician.  3. We make every effort to accommodate the date you request for surgery.  There are however, times where surgery dates or times have to be moved.  We will contact you as soon as possible if a change in schedule is required.   4. No Aspirin/Ibuprofen for one week before surgery.  If you are on aspirin, any non-steroidal anti-inflammatory medications (Mobic, Aleve, Ibuprofen) you should stop taking it 7 days prior to your surgery.  You make take Tylenol  For pain prior to surgery.  5. Medications- If you are taking daily heart and blood pressure medications, seizure, reflux, allergy, asthma, anxiety, pain or diabetes medications, make sure the surgery center/hospital is aware before the day of surgery so they may notify you which medications to take or avoid the day of surgery. 6. No food or drink after midnight the night before surgery unless directed otherwise by surgical center/hospital staff. 7. No alcoholic beverages 24 hours prior to surgery.  No smoking 24 hours prior to or 24 hours after surgery. 8. Wear loose pants or shorts- loose enough to fit over bandages, boots, and casts. 9. No slip on shoes, sneakers are best. 10. Bring  your boot with you to the surgery center/hospital.  Also bring crutches or a walker if your physician has prescribed it for you.  If you do not have this equipment, it will be provided for you after surgery. 11. If you have not been contracted by the surgery center/hospital by the day before your surgery, call to confirm the date and time of your surgery. 12. Leave-time from work may vary depending on the type of surgery you have.  Appropriate arrangements should be made prior to surgery with your employer. 13. Prescriptions will be provided immediately following surgery by your doctor.  Have these filled as soon as possible after surgery and take the medication as directed. 14. Remove nail polish on the operative foot. 15. Wash the night before surgery.  The night before surgery wash the foot and leg well with the antibacterial soap provided and water paying special attention to beneath the toenails and in between the toes.  Rinse thoroughly with water and dry well with a towel.  Perform this wash unless told not to do so by your physician.  Enclosed: 1 Ice pack (please put in freezer the night before surgery)   1 Hibiclens skin cleaner   Pre-op Instructions  If you have any questions regarding the instructions, do not hesitate to call our office.  [place]: [address] [place], [place] [address] [phone]  [place]: [address] [place], [place] [address] [phone]  [place]: [address]  , [place] [address] [phone]   Dr.   Norman Regal DPM, Dr. Matthew Wagoner DPM, Dr. M. Todd Hyatt DPM, Dr. Titorya Stover DPM 

## 2016-09-06 ENCOUNTER — Telehealth: Payer: Self-pay | Admitting: *Deleted

## 2016-09-06 NOTE — Telephone Encounter (Signed)
"  I called over to the surgical center to get the cost of my surgery.  They said they don't have my paperwork.  I couldn't give them the codes that they needed.  So, I'm following up to see if I'm scheduled."  You are scheduled, Paula Parker faxed the information this morning.  You are having an Paula Parker, the code is [phone].  The procedure will take about 45 minutes.  "Okay, thank you so much!

## 2016-09-28 ENCOUNTER — Encounter: Payer: Self-pay | Admitting: Podiatry

## 2016-09-28 DIAGNOSIS — I10 Essential (primary) hypertension: Secondary | ICD-10-CM | POA: Diagnosis not present

## 2016-09-28 DIAGNOSIS — M2012 Hallux valgus (acquired), left foot: Secondary | ICD-10-CM | POA: Diagnosis not present

## 2016-09-28 DIAGNOSIS — M21612 Bunion of left foot: Secondary | ICD-10-CM | POA: Diagnosis not present

## 2016-09-28 MED FILL — OXYCODONE-ACETAMINOPHEN 10-: 10-325 | 4 days supply | Qty: 25 | Fill #0

## 2016-09-28 MED FILL — ONDANSETRON HCL 4 MG TABLET: 4 | 5 days supply | Qty: 20 | Fill #0

## 2016-09-29 ENCOUNTER — Telehealth: Payer: Self-pay | Admitting: *Deleted

## 2016-09-29 NOTE — Telephone Encounter (Addendum)
Pt states the oxycodone is making her nauseous and light headed. I told pt the oxycodone could make her her nauseous to take 30 minutes after the anti-nausea medication and with more than crackers, and not to be up on her foot more than 15 minutes per hour, use walker for balance and to put foot down and lightly. Pt states understanding.

## 2016-10-01 NOTE — Progress Notes (Signed)
DOS 06.26.2018 [place] bunionectomy (cutting and moving bone) with pin fixation left.

## 2016-10-04 ENCOUNTER — Ambulatory Visit (INDEPENDENT_AMBULATORY_CARE_PROVIDER_SITE_OTHER): Payer: 59

## 2016-10-04 ENCOUNTER — Encounter: Payer: Self-pay | Admitting: Podiatry

## 2016-10-04 ENCOUNTER — Ambulatory Visit (INDEPENDENT_AMBULATORY_CARE_PROVIDER_SITE_OTHER): Payer: 59 | Admitting: Podiatry

## 2016-10-04 VITALS — Temp 97.9°F

## 2016-10-04 DIAGNOSIS — M2010 Hallux valgus (acquired), unspecified foot: Secondary | ICD-10-CM | POA: Diagnosis not present

## 2016-10-04 NOTE — Progress Notes (Signed)
Subjective:    Patient ID: Paula Parker, female   DOB: 56 y.o.   MRN: [id number]   HPI patient presents stating I am doing well with my left foot with minimal discomfort or swelling    ROS      Objective:  Physical Exam neurovascular status intact negative Homan sign was noted with patient's left foot healing well with wound edges well coapted hallux in rectus position with no drainage noted     Assessment:  Doing well post [place] osteotomy left       Plan:    Advised this patient on continued elevation compression and reapplied sterile dressing with instructions on continued immobilization. Reappoint in 3-4 weeks or earlier if needed  X-rays look good with osteotomy healing well pins in place good alignment and good correction of deformity

## 2016-10-08 ENCOUNTER — Ambulatory Visit (INDEPENDENT_AMBULATORY_CARE_PROVIDER_SITE_OTHER): Payer: 59 | Admitting: Internal Medicine

## 2016-10-08 ENCOUNTER — Encounter: Payer: Self-pay | Admitting: Internal Medicine

## 2016-10-08 VITALS — BP 144/94 | HR 91 | Ht 61.0 in | Wt 168.0 lb

## 2016-10-08 DIAGNOSIS — J0130 Acute sphenoidal sinusitis, unspecified: Secondary | ICD-10-CM

## 2016-10-08 DIAGNOSIS — M542 Cervicalgia: Secondary | ICD-10-CM | POA: Diagnosis not present

## 2016-10-08 DIAGNOSIS — J019 Acute sinusitis, unspecified: Secondary | ICD-10-CM | POA: Insufficient documentation

## 2016-10-08 DIAGNOSIS — J309 Allergic rhinitis, unspecified: Secondary | ICD-10-CM

## 2016-10-08 MED ORDER — CYCLOBENZAPRINE HCL 5 MG PO TABS: 5.0000 mg | ORAL_TABLET | Freq: Three times a day (TID) | ORAL | 1 refills | Status: DC | PRN

## 2016-10-08 MED ORDER — CETIRIZINE HCL 10 MG PO TABS: 10.0000 mg | ORAL_TABLET | Freq: Every day | ORAL | 11 refills | Status: DC

## 2016-10-08 MED ORDER — TRIAMCINOLONE ACETONIDE 55 MCG/ACT NA AERO: 2.0000 | INHALATION_SPRAY | Freq: Every day | NASAL | 12 refills | Status: DC

## 2016-10-08 MED ORDER — AZITHROMYCIN 250 MG PO TABS: ORAL_TABLET | ORAL | 1 refills | Status: DC

## 2016-10-08 MED FILL — ALL DAY ALLERGY 10 MG TAB: 10 | 100 days supply | Qty: 100 | Fill #0

## 2016-10-08 MED FILL — CYCLOBENZAPRINE 5 MG TABLET: 5 | 10 days supply | Qty: 30 | Fill #0

## 2016-10-08 MED FILL — AZITHROMYCIN 250 MG TABLET: 250 | 5 days supply | Qty: 6 | Fill #0

## 2016-10-08 NOTE — Patient Instructions (Addendum)
Please take all new medication as prescribed - the antibiotic, nasacort and muscle relaxer as needed  Please continue all other medications as before, and refills have been done for nasacort  You can also take Delsym OTC for cough, and/or Mucinex (or it's generic off brand) for congestion, and tylenol as needed for pain.  Please have the pharmacy call with any other refills you may need.  Please keep your appointments with your specialists as you may have planned

## 2016-10-08 NOTE — Progress Notes (Signed)
Subjective:    Patient ID: Paula Parker, female    DOB: October 07, 1960, 56 y.o.   MRN: [id number]  HPI   Here with 2-3 days acute onset fever, facial pain, pressure, headache, general weakness and malaise, and greenish d/c, with mild ST and cough, but pt denies chest pain, wheezing, increased sob or doe, orthopnea, PND, increased LE swelling, palpitations, dizziness or syncope. Does have several wks ongoing nasal allergy symptoms with clearish congestion, itch and sneezing, without fever, pain, ST, cough, swelling or wheezing. Also has pain to the right neck worst at the right mastoid area, without swelling or rash Past Medical History:  Diagnosis Date  . Alcoholic fatty liver   . GERD (gastroesophageal reflux disease)   . GLAUCOMA, BORDERLINE   . Hiatal hernia   . HYPERLIPIDEMIA   . HYPERTENSION   . PALPITATIONS, OCCASIONAL   . Pseudomembranous colitis   . SMOKER    Past Surgical History:  Procedure Laterality Date  . CERVIX LESION DESTRUCTION  1980'S  . OVARY SURGERY     removed precancerous cells with a laser    reports that she quit smoking about 2 years ago. Her smoking use included Cigarettes. She smoked 1.00 pack per day. She has never used smokeless tobacco. She reports that she does not drink alcohol or use drugs. family history includes ALS in her mother; Coronary artery disease in her other; Diabetes in her maternal grandfather; Early death in her sister; Heart disease in her sister; Ovarian cancer (age of onset: 76) in her sister. Allergies  Allergen Reactions  . Doxycycline Other (See Comments)    Throat swelling  . Benzonatate Rash and Other (See Comments)    unknown  . Penicillins Rash  . Sulfonamide Derivatives Rash and Other (See Comments)    unknown   No current outpatient prescriptions on file prior to visit.   No current facility-administered medications on file prior to visit.    Review of Systems  Constitutional: Negative for other unusual diaphoresis or  sweats HENT: Negative for ear discharge or swelling Eyes: Negative for other worsening visual disturbances Respiratory: Negative for stridor or other swelling  Gastrointestinal: Negative for worsening distension or other blood Genitourinary: Negative for retention or other urinary change Musculoskeletal: Negative for other MSK pain or swelling Skin: Negative for color change or other new lesions Neurological: Negative for worsening tremors and other numbness  Psychiatric/Behavioral: Negative for worsening agitation or other fatigue All other system neg per pt    Objective:   Physical Exam BP (!) 144/94   Pulse 91   Ht 5' 1"  (1.549 m)   Wt 168 lb (76.2 kg)   SpO2 99%   BMI 31.74 kg/m  VS noted, mild ill Constitutional: Pt appears in NAD HENT: Head: NCAT.  Right Ear: External ear normal.  Left Ear: External ear normal.  Eyes: . Pupils are equal, round, and reactive to light. Conjunctivae and EOM are normal Nose: without d/c or deformity \Bilat tm's with mild erythema.  Max sinus areas mild tender.  Pharynx with mild erythema, no exudate Neck: Neck supple. Gross normal ROM;  Cardiovascular: Normal rate and regular rhythm.   Pulmonary/Chest: Effort normal and breath sounds without rales or wheezing.  MSK: tender at the right scm/mastoid insertion site Neurological: Pt is alert. At baseline orientation, motor grossly intact Skin: Skin is warm. No rashes, other new lesions, no LE edema Psychiatric: Pt behavior is normal without agitation  No other exam findings    Assessment & Plan:

## 2016-10-10 DIAGNOSIS — M542 Cervicalgia: Secondary | ICD-10-CM | POA: Insufficient documentation

## 2016-10-10 NOTE — Assessment & Plan Note (Addendum)
C/w right scm strain, mild, for tylenol prn and muscle relaxer prn,  to f/u any worsening symptoms or concerns

## 2016-10-10 NOTE — Assessment & Plan Note (Signed)
Mild to mod, for antibx course,  to f/u any worsening symptoms or concerns 

## 2016-10-10 NOTE — Assessment & Plan Note (Signed)
Mild to mod, for zyrtec and nasacort asd,  to f/u any worsening symptoms or concerns 

## 2016-11-01 ENCOUNTER — Ambulatory Visit (INDEPENDENT_AMBULATORY_CARE_PROVIDER_SITE_OTHER): Payer: 59

## 2016-11-01 ENCOUNTER — Ambulatory Visit (INDEPENDENT_AMBULATORY_CARE_PROVIDER_SITE_OTHER): Payer: 59 | Admitting: Podiatry

## 2016-11-01 DIAGNOSIS — M2012 Hallux valgus (acquired), left foot: Secondary | ICD-10-CM

## 2016-11-01 DIAGNOSIS — M2011 Hallux valgus (acquired), right foot: Secondary | ICD-10-CM | POA: Diagnosis not present

## 2016-11-02 NOTE — Progress Notes (Signed)
Subjective:    Patient ID: Paula Parker, female   DOB: 56 y.o.   MRN: [id number]   HPI patient states she's doing well with her left foot    ROS      Objective:  Physical Exam neurovascular status intact negative Homans sign noted with wound edges well coapted left first metatarsal with hallux in rectus position good range of motion with no crepitus     Assessment:    Doing well structural bunion deformity left with correction     Plan:     H&P condition reviewed and an x-ray reviewed. Patient is doing very well postoperatively can begin gradual increase in ambulation continue elevation compression immobilization as needed  X-ray indicates osteotomy is healing well pins in place joint congruence with good alignment

## 2016-11-22 ENCOUNTER — Telehealth: Payer: Self-pay | Admitting: Podiatry

## 2016-11-22 ENCOUNTER — Encounter: Payer: Self-pay | Admitting: *Deleted

## 2016-11-22 NOTE — Telephone Encounter (Addendum)
I printed pt's messages and LOV and gave to [facility] - Dr. Mellody Drown assistant for return to work orders. Dr. Josephina Shih pt to return to work 11/25/2016 full duty without restrictions. Bethann Humble informed pt she could pick up a letter to return to work 11/25/2016 full duty.

## 2016-11-22 NOTE — Telephone Encounter (Signed)
Pt is requesting a return to work note listing any restrictions and/or limitations. States her return to work date is 23 August. Pt requested a call when complete so she can come pick it up.

## 2016-11-22 NOTE — Telephone Encounter (Signed)
Pt called back wanting to know if it was okay for her to come by in the morning to pick up her return to work note. I told her yes that was fine.

## 2016-11-22 NOTE — Telephone Encounter (Signed)
Yes I was calling back to see when my return to work note would be done. Please call me back at [phone].

## 2016-11-22 NOTE — Telephone Encounter (Signed)
Pt called back wanting to know if return to work note for 23 August was ready. I told her it would be ready in 15 minutes.

## 2016-12-09 ENCOUNTER — Encounter: Payer: Self-pay | Admitting: Podiatry

## 2016-12-09 ENCOUNTER — Ambulatory Visit (INDEPENDENT_AMBULATORY_CARE_PROVIDER_SITE_OTHER): Payer: 59

## 2016-12-09 ENCOUNTER — Ambulatory Visit (INDEPENDENT_AMBULATORY_CARE_PROVIDER_SITE_OTHER): Payer: Self-pay | Admitting: Podiatry

## 2016-12-09 DIAGNOSIS — M2012 Hallux valgus (acquired), left foot: Secondary | ICD-10-CM | POA: Diagnosis not present

## 2016-12-15 NOTE — Progress Notes (Signed)
Subjective:    Patient ID: Paula Parker, female   DOB: 56 y.o.   MRN: [id number]   HPI patient states doing well with surgery with mild swelling if she does too much but overall doing most normal activities    ROS      Objective:  Physical Exam neurovascular status intact negative Homans sign was noted with good alignment of the first MPJ with wound edges well coapted     Assessment:    Doing well post osteotomy first metatarsal left     Plan:   X-ray reviewed and advised on gradual increase in activities with continued compression elevation as needed and patient be seen back as needed  X-rays indicate good alignment with joint congruence pins in place no indication of movement

## 2017-02-10 ENCOUNTER — Ambulatory Visit (INDEPENDENT_AMBULATORY_CARE_PROVIDER_SITE_OTHER): Payer: 59 | Admitting: Podiatry

## 2017-02-10 ENCOUNTER — Ambulatory Visit (INDEPENDENT_AMBULATORY_CARE_PROVIDER_SITE_OTHER): Payer: 59

## 2017-02-10 ENCOUNTER — Encounter: Payer: Self-pay | Admitting: Podiatry

## 2017-02-10 ENCOUNTER — Other Ambulatory Visit: Payer: Self-pay | Admitting: Podiatry

## 2017-02-10 DIAGNOSIS — M779 Enthesopathy, unspecified: Secondary | ICD-10-CM | POA: Diagnosis not present

## 2017-02-10 DIAGNOSIS — M2012 Hallux valgus (acquired), left foot: Secondary | ICD-10-CM

## 2017-02-10 NOTE — Progress Notes (Signed)
Subjective:    Patient ID: Paula Parker, female   DOB: 56 y.o.   MRN: [id number]   HPI patient states I seem to be improving but I get pain in general in my left foot and I am on my foot all the time with work  ROS      Objective:  Physical Exam neurovascular status intact with patient's left first MPJ showing good range of motion with no crepitus incision site well healed and mild discomfort in the plantar capsule of the first and second MPJ     Assessment:    Mild inflammatory condition secondary to prolonged weightbearing with excellent healing osteotomy     Plan:   X-ray reviewed and recommended anti-inflammatories for her to take along with padding and consideration of orthotics if symptoms were to persist. Patient will be seen back to recheck as needed  X-rays indicate the bone is healing very well with no indications of movement and good alignment

## 2017-02-15 ENCOUNTER — Ambulatory Visit (INDEPENDENT_AMBULATORY_CARE_PROVIDER_SITE_OTHER): Payer: 59 | Admitting: Family Medicine

## 2017-02-15 ENCOUNTER — Ambulatory Visit (HOSPITAL_COMMUNITY)
Admission: RE | Admit: 2017-02-15 | Discharge: 2017-02-15 | Disposition: A | Discharge status: Home / Self Care | Payer: 59 | Source: Ambulatory Visit | Attending: Cardiology | Admitting: Cardiology

## 2017-02-15 ENCOUNTER — Encounter: Payer: Self-pay | Admitting: Family Medicine

## 2017-02-15 VITALS — BP 128/74 | HR 89 | Temp 98.4°F | Ht 61.0 in | Wt 175.0 lb

## 2017-02-15 DIAGNOSIS — M25561 Pain in right knee: Secondary | ICD-10-CM

## 2017-02-15 DIAGNOSIS — R05 Cough: Secondary | ICD-10-CM

## 2017-02-15 DIAGNOSIS — M7989 Other specified soft tissue disorders: Secondary | ICD-10-CM | POA: Diagnosis not present

## 2017-02-15 DIAGNOSIS — R059 Cough, unspecified: Secondary | ICD-10-CM

## 2017-02-15 MED ORDER — DICLOFENAC SODIUM 2 % TD SOLN: 1.0000 "application " | Freq: Two times a day (BID) | TRANSDERMAL | 2 refills | Status: DC

## 2017-02-15 NOTE — Progress Notes (Signed)
Leigha Olberding - 56 y.o. female MRN [id number]  Date of birth: 12/12/1960  SUBJECTIVE:  Including CC & ROS.  Chief Complaint  Patient presents with  . Right knee pain    She states it has been painful and swelling present for 2 weeks.Denies injury. Painful to get up to walk.   . Cough    Present for 4 days, she is producing green sputum. Denies fever.    Ms. Ratledge is a 56 y.o. female that is presenting with right knee pain and cough.  The knee pain has been occurring for the past 2 weeks.  The pain is acute in nature.  The pain is worse after walking the pain is dull.  She has not tried any therapy or medications.  She also endorses leg swelling travel.  She denies any shortness of breath.  She denies any blood production with the cough.  She denies any prior surgery or injury to her right knee.  Pain is localized to the knee joint.  Denies any radicular symptoms.  Pain is mild to moderate in nature.  Cough has been occurring for 4 days.  Some sputum that is green in nature.  Denies any fevers.  Denies any sick contacts.  Seems to be staying the same.  Has not tried any over-the-counter medications.  Seems to be staying the same.    Review of Systems  Constitutional: Negative for fever.  Respiratory: Positive for cough. Negative for shortness of breath.   Cardiovascular: Negative for chest pain.  Musculoskeletal: Positive for arthralgias. Negative for gait problem and joint swelling.  Skin: Negative for color change.  Neurological: Negative for weakness and numbness.  Hematological: Negative for adenopathy.    HISTORY: Past Medical, Surgical, Social, and Family History Reviewed & Updated per EMR.   Pertinent Historical Findings include:  Past Medical History:  Diagnosis Date  . Alcoholic fatty liver   . GERD (gastroesophageal reflux disease)   . GLAUCOMA, BORDERLINE   . Hiatal hernia   . HYPERLIPIDEMIA   . HYPERTENSION   . PALPITATIONS, OCCASIONAL   . Pseudomembranous colitis   .  SMOKER     Past Surgical History:  Procedure Laterality Date  . CERVIX LESION DESTRUCTION  1980'S  . OVARY SURGERY     removed precancerous cells with a laser    Allergies  Allergen Reactions  . Doxycycline Other (See Comments)    Throat swelling  . Benzonatate Rash and Other (See Comments)    unknown  . Penicillins Rash  . Sulfonamide Derivatives Rash and Other (See Comments)    unknown    Family History  Problem Relation Age of Onset  . Ovarian cancer Sister 22  . Heart disease Sister   . Early death Sister   . ALS Mother   . Coronary artery disease Other        female first degree <50  . Diabetes Maternal Grandfather   . Stroke Neg Hx   . Kidney disease Neg Hx   . Hypertension Neg Hx   . Hyperlipidemia Neg Hx   . Osteoporosis Neg Hx      Social History   Socioeconomic History  . Marital status: Married    Spouse name: Not on file  . Number of children: 1  . Years of education: Not on file  . Highest education level: Not on file  Social Needs  . Financial resource strain: Not on file  . Food insecurity - worry: Not on file  . Food insecurity -  inability: Not on file  . Transportation needs - medical: Not on file  . Transportation needs - non-medical: Not on file  Occupational History    Employer: [employer]  Tobacco Use  . Smoking status: Former Smoker    Packs/day: 1.00    Types: Cigarettes    Last attempt to quit: 04/27/2014    Years since quitting: 2.8  . Smokeless tobacco: Never Used  Substance and Sexual Activity  . Alcohol use: No    Alcohol/week: 0.0 oz  . Drug use: No  . Sexual activity: Not Currently  Other Topics Concern  . Not on file  Social History Narrative   ** Merged History Encounter **       Regular exercise yes     PHYSICAL EXAM:  VS: BP 128/74 (BP Location: Left Arm, Patient Position: Sitting, Cuff Size: Normal)   Pulse 89   Temp 98.4 F (36.9 C) (Oral)   Ht 5' 1"  (1.549 m)   Wt 175 lb (79.4 kg)   SpO2 100%   BMI  33.07 kg/m  Physical Exam Gen: NAD, alert, cooperative with exam, well-appearing ENT: normal lips, normal nasal mucosa, tympanic membranes clear and intact bilaterally, normal oropharynx, no cervical lymphadenopathy Eye: normal EOM, normal conjunctiva and lids CV:  no edema, +2 pedal pulses, regular rate and rhythm, S1-S2 Resp: no accessory muscle use, non-labored, clear to auscultation bilaterally, no crackles or wheezes Skin: no rashes, no areas of induration  Neuro: normal tone, normal sensation to touch Psych:  normal insight, alert and oriented MSK:  Right knee: Normal to inspection with no erythema or effusion or obvious bony abnormalities. Palpation normal with no warmth, joint line tenderness, patellar tenderness, or condyle tenderness. ROM full in flexion and extension and lower leg rotation. Ligaments with solid consistent endpoints including  LCL, MCL. Negative Mcmurray's Non painful patellar compression. Patellar glide without crepitus. Patellar and quadriceps tendons unremarkable. Hamstring and quadriceps strength is normal.  Neurovascularly intact  Limited ultrasound: Right knee:  No significant fluid within the suprapatellar pouch No significant joint space narrowing the lateral or medial joint line. No significant changes of the meniscus that were observed.   Summary: Normal exam.  Ultrasound and interpretation by Clearance Coots, MD             ASSESSMENT & PLAN:   Right leg swelling Right leg is slightly more swollen than the left. Nothing on ultrasound to suggest a ruptured Baker's cyst. - Venous duplex of the right lower extremity.  - This was messaged to be negative.  - Can consider compression if this is ongoing.  Cough Most likely viral in nature. - Counseled on supportive care.  Acute pain of right knee This appears to be patellofemoral in nature. No mechanical symptoms. - Counseled on supportive care.  - could consider PT.  -  counseled on Home exercise therapy.

## 2017-02-15 NOTE — Patient Instructions (Signed)
Thank you for coming in,   Please try things such as zyrtec-D or allegra-D which is an antihistamine and decongestant.   Please try afrin which will help with nasal congestion but use for only three days.   Please also try using a netti pot on a regular occasion.  Honey can help with a sore throat.   Please try the exercises.   If there isn't improvement in your pain then we can try physical therapy.       Please feel free to call with any questions or concerns at any time, at [phone]. --Dr. Raeford Razor

## 2017-02-16 DIAGNOSIS — M25561 Pain in right knee: Secondary | ICD-10-CM | POA: Insufficient documentation

## 2017-02-16 DIAGNOSIS — M7989 Other specified soft tissue disorders: Secondary | ICD-10-CM | POA: Insufficient documentation

## 2017-02-16 NOTE — Assessment & Plan Note (Signed)
Most likely viral in nature. - Counseled on supportive care.

## 2017-02-16 NOTE — Assessment & Plan Note (Signed)
This appears to be patellofemoral in nature. No mechanical symptoms. - Counseled on supportive care.  - could consider PT.  - counseled on Home exercise therapy.

## 2017-02-16 NOTE — Assessment & Plan Note (Signed)
Right leg is slightly more swollen than the left. Nothing on ultrasound to suggest a ruptured Baker's cyst. - Venous duplex of the right lower extremity.  - This was messaged to be negative.  - Can consider compression if this is ongoing.

## 2017-02-18 ENCOUNTER — Telehealth: Payer: Self-pay | Admitting: Family Medicine

## 2017-02-18 NOTE — Telephone Encounter (Signed)
Patient was seen on 11/13. She states her cough is worse and she is coughing so hard she is throwing up. She wanted to know if she could get something called in. I informed her at 440pm we would not be able to get a response from the doctor. She has set up an appointment for Saturday clinic.

## 2017-02-19 ENCOUNTER — Encounter: Payer: Self-pay | Admitting: Family Medicine

## 2017-02-19 ENCOUNTER — Ambulatory Visit (INDEPENDENT_AMBULATORY_CARE_PROVIDER_SITE_OTHER): Payer: 59 | Admitting: Family Medicine

## 2017-02-19 VITALS — BP 140/80 | HR 83 | Temp 98.3°F | Ht 61.0 in | Wt 175.0 lb

## 2017-02-19 DIAGNOSIS — J209 Acute bronchitis, unspecified: Secondary | ICD-10-CM | POA: Diagnosis not present

## 2017-02-19 MED ORDER — AZITHROMYCIN 250 MG PO TABS: ORAL_TABLET | ORAL | 0 refills | Status: DC

## 2017-02-19 MED ORDER — HYDROCODONE-HOMATROPINE 5-1.5 MG/5ML PO SYRP: 5.0000 mL | ORAL_SOLUTION | ORAL | 0 refills | Status: DC | PRN

## 2017-02-19 NOTE — Progress Notes (Signed)
   Subjective:    Patient ID: Paula Parker, female    DOB: 06/18/1960, 56 y.o.   MRN: [id number]  HPI Here for one week of stuffy head, PND, chest congestion and coughing up green sputum. No fever.    Review of Systems  Constitutional: Negative.   HENT: Positive for congestion and postnasal drip. Negative for sinus pressure, sinus pain and sore throat.   Eyes: Negative.   Respiratory: Positive for cough and chest tightness.        Objective:   Physical Exam  Constitutional: She appears well-developed and well-nourished.  HENT:  Right Ear: External ear normal.  Left Ear: External ear normal.  Nose: Nose normal.  Mouth/Throat: Oropharynx is clear and moist.  Eyes: Conjunctivae are normal.  Neck: No thyromegaly present.  Pulmonary/Chest: Effort normal. No respiratory distress. She has no wheezes. She has no rales.  Scattered rhonchi   Lymphadenopathy:    She has no cervical adenopathy.          Assessment & Plan:  Bronchitis, treat with a Zpack. Add Mucinex prn.  Alysia Penna, MD

## 2017-07-08 ENCOUNTER — Encounter: Payer: Self-pay | Admitting: Emergency Medicine

## 2017-07-08 ENCOUNTER — Ambulatory Visit: Payer: Self-pay | Admitting: Emergency Medicine

## 2017-07-08 VITALS — BP 118/90 | HR 95 | Temp 98.9°F | Resp 16 | Wt 165.4 lb

## 2017-07-08 DIAGNOSIS — L03114 Cellulitis of left upper limb: Secondary | ICD-10-CM

## 2017-07-08 MED ORDER — CEPHALEXIN 500 MG PO CAPS
500.0000 mg | ORAL_CAPSULE | Freq: Four times a day (QID) | ORAL | 0 refills | Status: DC
Start: 2017-07-08 — End: 2017-10-11

## 2017-07-08 MED FILL — CEPHALEXIN 500 MG CAPSULE: 500 | 7 days supply | Qty: 28 | Fill #0

## 2017-07-08 NOTE — Progress Notes (Signed)
S: Paula Parker is a 57 y.o. female who presents for left hand swelling and pain. She was scratched by her cat 3 days ago. She has not fallen or had any injury. She is right hand dominate, otherwise reports good health.  Review of Systems  Constitutional: Negative for chills and fever.  Gastrointestinal: Negative for nausea and vomiting.  Musculoskeletal: Positive for joint pain. Negative for myalgias.    O:  Vitals:   07/08/17 1652  BP: 118/90  Pulse: 95  Resp: 16  Temp: 98.9 F (37.2 C)  SpO2: 96%   Physical Exam  Constitutional: She appears well-developed and well-nourished. No distress.  Musculoskeletal: Normal range of motion. She exhibits edema and tenderness. She exhibits no deformity.       Hands: PMS intact distally, capillary refill less than 2 seconds, equal grip strength, able to flex and extend  Neurological: She is alert.  Skin: Skin is warm and dry. Capillary refill takes less than 2 seconds. She is not diaphoretic.  Nursing note and vitals reviewed.   A:  1. Cellulitis of left upper extremity     P: 1. Cellulitis of left upper extremity PCN allergic, likely environmental caused rather than hospital acquired, will cover with Keflex. Advised if symptoms worsen go to the ER, if symptoms remain, follow up at the [facility] or a hand specialist

## 2017-07-08 NOTE — Patient Instructions (Signed)

## 2017-07-15 ENCOUNTER — Telehealth: Payer: Self-pay

## 2017-10-11 ENCOUNTER — Other Ambulatory Visit (INDEPENDENT_AMBULATORY_CARE_PROVIDER_SITE_OTHER): Payer: 59

## 2017-10-11 ENCOUNTER — Ambulatory Visit (INDEPENDENT_AMBULATORY_CARE_PROVIDER_SITE_OTHER): Payer: 59 | Admitting: Internal Medicine

## 2017-10-11 ENCOUNTER — Encounter: Payer: Self-pay | Admitting: Internal Medicine

## 2017-10-11 VITALS — BP 134/80 | HR 62 | Temp 98.4°F | Resp 16 | Ht 61.0 in | Wt 158.0 lb

## 2017-10-11 DIAGNOSIS — M818 Other osteoporosis without current pathological fracture: Secondary | ICD-10-CM | POA: Diagnosis not present

## 2017-10-11 DIAGNOSIS — E559 Vitamin D deficiency, unspecified: Secondary | ICD-10-CM | POA: Insufficient documentation

## 2017-10-11 DIAGNOSIS — R10813 Right lower quadrant abdominal tenderness: Secondary | ICD-10-CM | POA: Diagnosis not present

## 2017-10-11 DIAGNOSIS — I10 Essential (primary) hypertension: Secondary | ICD-10-CM

## 2017-10-11 DIAGNOSIS — Z1239 Encounter for other screening for malignant neoplasm of breast: Secondary | ICD-10-CM

## 2017-10-11 DIAGNOSIS — Z1231 Encounter for screening mammogram for malignant neoplasm of breast: Secondary | ICD-10-CM

## 2017-10-11 DIAGNOSIS — J301 Allergic rhinitis due to pollen: Secondary | ICD-10-CM

## 2017-10-11 DIAGNOSIS — Z0000 Encounter for general adult medical examination without abnormal findings: Secondary | ICD-10-CM

## 2017-10-11 DIAGNOSIS — Z1211 Encounter for screening for malignant neoplasm of colon: Secondary | ICD-10-CM

## 2017-10-11 LAB — COMPREHENSIVE METABOLIC PANEL
ALK PHOS: 49 U/L (ref 39–117)
ALT: 21 U/L (ref 0–35)
AST: 23 U/L (ref 0–37)
Albumin: 4.3 g/dL (ref 3.5–5.2)
BUN: 15 mg/dL (ref 6–23)
CO2: 31 meq/L (ref 19–32)
CREATININE: 0.78 mg/dL (ref 0.40–1.20)
Calcium: 9.3 mg/dL (ref 8.4–10.5)
Chloride: 105 mEq/L (ref 96–112)
GFR: 81.18 mL/min (ref >60.00)
GLUCOSE: 96 mg/dL (ref 70–99)
Potassium: 4.1 mEq/L (ref 3.5–5.1)
Sodium: 141 mEq/L (ref 135–145)
TOTAL PROTEIN: 6.8 g/dL (ref 6.0–8.3)
Total Bilirubin: 0.5 mg/dL (ref 0.2–1.2)

## 2017-10-11 LAB — LIPID PANEL
CHOL/HDL RATIO: 2
CHOLESTEROL: 172 mg/dL (ref 0–200)
HDL: 76.8 mg/dL (ref >39.00)
LDL Cholesterol: 81 mg/dL (ref 0–99)
NonHDL: 95.59
TRIGLYCERIDES: 74 mg/dL (ref 0.0–149.0)
VLDL: 14.8 mg/dL (ref 0.0–40.0)

## 2017-10-11 LAB — URINALYSIS, ROUTINE W REFLEX MICROSCOPIC
Bilirubin Urine: NEGATIVE
Ketones, ur: NEGATIVE
Leukocytes, UA: NEGATIVE
Nitrite: NEGATIVE
PH: 6 (ref 5.0–8.0)
RBC / HPF: NONE SEEN (ref 0–?)
Specific Gravity, Urine: 1.02 (ref 1.000–1.030)
TOTAL PROTEIN, URINE-UPE24: NEGATIVE
Urine Glucose: NEGATIVE
Urobilinogen, UA: 0.2 (ref 0.0–1.0)

## 2017-10-11 LAB — VITAMIN D 25 HYDROXY (VIT D DEFICIENCY, FRACTURES): VITD: 24.04 ng/mL — ABNORMAL LOW (ref 30.00–100.00)

## 2017-10-11 LAB — CBC WITH DIFFERENTIAL/PLATELET
BASOS ABS: 0.1 10*3/uL (ref 0.0–0.1)
Basophils Relative: 1.1 % (ref 0.0–3.0)
EOS PCT: 1.8 % (ref 0.0–5.0)
Eosinophils Absolute: 0.1 10*3/uL (ref 0.0–0.7)
HEMATOCRIT: 43.1 % (ref 36.0–46.0)
Hemoglobin: 14.3 g/dL (ref 12.0–15.0)
LYMPHS ABS: 2.3 10*3/uL (ref 0.7–4.0)
LYMPHS PCT: 40.4 % (ref 12.0–46.0)
MCHC: 33.1 g/dL (ref 30.0–36.0)
MCV: 95.5 fl (ref 78.0–100.0)
MONOS PCT: 8 % (ref 3.0–12.0)
Monocytes Absolute: 0.5 10*3/uL (ref 0.1–1.0)
NEUTROS ABS: 2.8 10*3/uL (ref 1.4–7.7)
NEUTROS PCT: 48.7 % (ref 43.0–77.0)
PLATELETS: 256 10*3/uL (ref 150.0–400.0)
RBC: 4.51 Mil/uL (ref 3.87–5.11)
RDW: 13.3 % (ref 11.5–15.5)
WBC: 5.8 10*3/uL (ref 4.0–10.5)

## 2017-10-11 LAB — LIPASE: LIPASE: 46 U/L (ref 11.0–59.0)

## 2017-10-11 MED ORDER — LEVOCETIRIZINE DIHYDROCHLORIDE 5 MG PO TABS: 5.0000 mg | ORAL_TABLET | Freq: Every evening | ORAL | 1 refills | Status: DC

## 2017-10-11 MED ORDER — CHOLECALCIFEROL 1.25 MG (50000 UT) PO CAPS: 50000.0000 [IU] | ORAL_CAPSULE | ORAL | 1 refills | Status: DC

## 2017-10-11 NOTE — Patient Instructions (Signed)

## 2017-10-11 NOTE — Progress Notes (Signed)
Subjective:  Patient ID: Paula Parker, female    DOB: 02-Oct-1960  Age: 57 y.o. MRN: [id number]  CC: Hypertension and Annual Exam   HPI Paula Parker presents for a CPX.  She complains of a 1 month history of intermittent right lower quadrant abdominal pain.  She denies nausea, vomiting, loss of appetite, weight loss, diarrhea, or constipation.  She also complains of runny nose and postnasal drip.  She has a sense of chronic throat clearing.  She is not willing to use a steroid nasal spray.  She tells me her blood pressures have been well controlled.  Outpatient Medications Prior to Visit  Medication Sig Dispense Refill  . azithromycin (ZITHROMAX) 250 MG tablet As directed (Patient not taking: Reported on 07/08/2017) 6 tablet 0  . cephALEXin (KEFLEX) 500 MG capsule Take 1 capsule (500 mg total) by mouth 4 (four) times daily. 28 capsule 0  . Diclofenac Sodium (PENNSAID) 2 % SOLN Place 1 application 2 (two) times daily onto the skin. (Patient not taking: Reported on 02/19/2017) 1 Bottle 2  . HYDROcodone-homatropine (HYDROMET) 5-1.5 MG/5ML syrup Take 5 mLs every 4 (four) hours as needed by mouth. (Patient not taking: Reported on 07/08/2017) 240 mL 0   No facility-administered medications prior to visit.     ROS Review of Systems  Constitutional: Negative for chills, diaphoresis, fatigue and fever.  HENT: Positive for congestion, postnasal drip and rhinorrhea. Negative for facial swelling, nosebleeds, sinus pressure, sinus pain, sneezing, sore throat, trouble swallowing and voice change.   Eyes: Negative.   Respiratory: Negative.  Negative for cough, chest tightness, shortness of breath and wheezing.   Cardiovascular: Negative for chest pain, palpitations and leg swelling.  Gastrointestinal: Positive for abdominal pain. Negative for constipation, diarrhea, nausea and vomiting.       RLQ abd pain for 1 month  Endocrine: Negative.   Genitourinary: Negative.  Negative for decreased urine  volume, difficulty urinating, dysuria, flank pain, frequency, hematuria, urgency, vaginal bleeding and vaginal discharge.  Musculoskeletal: Negative.  Negative for back pain, myalgias and neck pain.  Skin: Negative.  Negative for rash.  Allergic/Immunologic: Negative.   Neurological: Negative.   Hematological: Negative for adenopathy. Does not bruise/bleed easily.  Psychiatric/Behavioral: Negative.     Objective:  BP 134/80 (BP Location: Left Arm, Patient Position: Sitting, Cuff Size: Large)   Pulse 62   Temp 98.4 F (36.9 C) (Oral)   Resp 16   Ht 5' 1"  (1.549 m)   Wt 158 lb (71.7 kg)   SpO2 98%   BMI 29.85 kg/m   BP Readings from Last 3 Encounters:  10/11/17 134/80  07/08/17 118/90  02/19/17 140/80    Wt Readings from Last 3 Encounters:  10/11/17 158 lb (71.7 kg)  07/08/17 165 lb 6.4 oz (75 kg)  02/19/17 175 lb (79.4 kg)    Physical Exam  Constitutional: She is oriented to person, place, and time. No distress.  HENT:  Nose: Mucosal edema and rhinorrhea present. No sinus tenderness. No epistaxis. Right sinus exhibits no maxillary sinus tenderness and no frontal sinus tenderness. Left sinus exhibits no maxillary sinus tenderness and no frontal sinus tenderness.  Mouth/Throat: Oropharynx is clear and moist. No oropharyngeal exudate.  Eyes: Conjunctivae are normal. No scleral icterus.  Neck: Normal range of motion. Neck supple. No JVD present.  Cardiovascular: Normal rate, regular rhythm and normal heart sounds. Exam reveals no friction rub.  No murmur heard. Pulmonary/Chest: Effort normal and breath sounds normal. She has no wheezes. She has no  rales.  Abdominal: Soft. Normal appearance and bowel sounds are normal. There is no hepatosplenomegaly. There is tenderness in the right lower quadrant. There is no rigidity, no rebound, no guarding, no CVA tenderness, no tenderness at McBurney's point and negative Murphy's sign. No hernia.  Musculoskeletal: Normal range of motion.  She exhibits no edema, tenderness or deformity.  Lymphadenopathy:    She has no cervical adenopathy.  Neurological: She is alert and oriented to person, place, and time.  Skin: Skin is warm and dry. No rash noted. She is not diaphoretic. No pallor.  Vitals reviewed.   Lab Results  Component Value Date   WBC 5.8 10/11/2017   HGB 14.3 10/11/2017   HCT 43.1 10/11/2017   PLT 256.0 10/11/2017   GLUCOSE 96 10/11/2017   CHOL 172 10/11/2017   TRIG 74.0 10/11/2017   HDL 76.80 10/11/2017   LDLCALC 81 10/11/2017   ALT 21 10/11/2017   AST 23 10/11/2017   NA 141 10/11/2017   K 4.1 10/11/2017   CL 105 10/11/2017   CREATININE 0.78 10/11/2017   BUN 15 10/11/2017   CO2 31 10/11/2017   TSH 4.00 06/19/2015    No results found.  Assessment & Plan:   Paula Parker was seen today for hypertension and annual exam.  Diagnoses and all orders for this visit:  Essential hypertension, benign- Her blood pressure is not quite adequately well controlled.  I will treat the vitamin D deficiency. -     CBC with Differential/Platelet; Future -     Urinalysis, Routine w reflex microscopic; Future -     Comprehensive metabolic panel; Future  Breast cancer screening -     Cancel: MM Digital Screening; Future  Other osteoporosis, unspecified pathological fracture presence -     VITAMIN D 25 Hydroxy (Vit-D Deficiency, Fractures); Future -     DG Bone Density; Future -     Cologuard  Visit for screening mammogram -     MM DIGITAL SCREENING BILATERAL; Future  Colon cancer screening -     Cologuard  Seasonal allergic rhinitis due to pollen -     levocetirizine (XYZAL) 5 MG tablet; Take 1 tablet (5 mg total) by mouth every evening.  Right lower quadrant abdominal tenderness without rebound tenderness- Her lab work is negative for any evidence of organic pathology.  I have asked her to undergo a CT scan of the abdomen and pelvis to see if there is a mass that would explain her pain. -     Lipase; Future -      CBC with Differential/Platelet; Future -     Urinalysis, Routine w reflex microscopic; Future -     Comprehensive metabolic panel; Future -     CT Abdomen Pelvis W Contrast; Future  Routine general medical examination at a health care facility- Exam completed, labs reviewed, vaccines reviewed, her Pap smear is up-to-date, she is referred for colon cancer screening and a mammogram.  Patient education material was given. -     Lipid panel; Future  Vitamin D deficiency disease -     Cholecalciferol 50000 units capsule; Take 1 capsule (50,000 Units total) by mouth once a week.   I have discontinued Vaughan Basta Boven's Diclofenac Sodium, azithromycin, HYDROcodone-homatropine, and cephALEXin. I am also having her start on levocetirizine and Cholecalciferol.  Meds ordered this encounter  Medications  . levocetirizine (XYZAL) 5 MG tablet    Sig: Take 1 tablet (5 mg total) by mouth every evening.    Dispense:  90  tablet    Refill:  1  . Cholecalciferol 50000 units capsule    Sig: Take 1 capsule (50,000 Units total) by mouth once a week.    Dispense:  12 capsule    Refill:  1     Follow-up: Return in about 1 month (around 11/08/2017).  Scarlette Calico, MD

## 2017-10-13 ENCOUNTER — Encounter: Payer: Self-pay | Admitting: Internal Medicine

## 2017-10-13 ENCOUNTER — Telehealth: Payer: Self-pay | Admitting: Internal Medicine

## 2017-10-13 NOTE — Telephone Encounter (Signed)
Informed patient

## 2017-10-13 NOTE — Telephone Encounter (Unsigned)
Copied from [id number]. Topic: Quick Communication - See Telephone Encounter >> Oct 13, 2017  2:22 PM Neva Seat wrote: Pt is going to be drinking the liquid and the IV at the CT Scan.  Please call pt to let her know why she will be needing both. Pt is needing a call back asap because she is having the procedure done in the morning.

## 2017-10-13 NOTE — Telephone Encounter (Signed)
Pt is have an scan with contrast of the abdomen and pelvis. The IV will light up the areas of the pelvis and the oral contrast will light up the areas of the abdomen (upper and lower GI).

## 2017-10-14 ENCOUNTER — Ambulatory Visit (INDEPENDENT_AMBULATORY_CARE_PROVIDER_SITE_OTHER)
Admission: RE | Admit: 2017-10-14 | Discharge: 2017-10-14 | Disposition: A | Discharge status: Home / Self Care | Payer: 59 | Source: Ambulatory Visit | Attending: Internal Medicine | Admitting: Internal Medicine

## 2017-10-14 DIAGNOSIS — R10813 Right lower quadrant abdominal tenderness: Secondary | ICD-10-CM

## 2017-10-14 DIAGNOSIS — R1031 Right lower quadrant pain: Secondary | ICD-10-CM | POA: Diagnosis not present

## 2017-10-14 MED ORDER — IOPAMIDOL (ISOVUE-300) INJECTION 61%
100.0000 mL | Freq: Once | INTRAVENOUS | Status: AC | PRN
  Administered 2017-10-14: 100 mL via INTRAVENOUS

## 2017-10-17 ENCOUNTER — Encounter: Payer: Self-pay | Admitting: Internal Medicine

## 2017-10-17 ENCOUNTER — Other Ambulatory Visit: Payer: Self-pay | Admitting: Internal Medicine

## 2017-10-17 DIAGNOSIS — K8020 Calculus of gallbladder without cholecystitis without obstruction: Secondary | ICD-10-CM | POA: Insufficient documentation

## 2017-10-25 MED FILL — LEVOCETIRIZINE 5 MG TABLET: 5 | 90 days supply | Qty: 90 | Fill #0

## 2017-10-25 MED FILL — VIT D3-50 50,000 UNITS CAPS: 1.25 MG | 84 days supply | Qty: 12 | Fill #0

## 2017-11-07 ENCOUNTER — Encounter: Payer: Self-pay | Admitting: Internal Medicine

## 2017-11-17 ENCOUNTER — Ambulatory Visit: Payer: Self-pay | Admitting: Surgery

## 2017-11-17 DIAGNOSIS — K8020 Calculus of gallbladder without cholecystitis without obstruction: Secondary | ICD-10-CM | POA: Diagnosis not present

## 2017-11-17 NOTE — H&P (Signed)
CC: Referred by primary care for evaluation of possible symptomatic cholelithiasis  HPI: Paula Parker is a very pleasant 57 year old female with history of hypo-vitamin D who presents from her primary care for evaluation of possible symptomatic cholelithiasis. She describes a multiyear history of intermittent right upper quadrant crampy abdominal discomfort that typically lasts less than 30 minutes. She states these attacks occur every 1-2 months. They can reliably be brought on by eating greasy and fatty foods. The pain does not radiate. Avoiding fatty foods has help decrease the occurrence of these. She denies any history of fever/chills related to this pain. She denies any history of yellowing of her skin or eyes, pale stool, or coke colored urine. Her laboratory evaluations have always been normal.  She also notes history of intermittent crampy abdominal discomfort/gas related pains.  She underwent CT abdomen/pelvis 10/2017 which demonstrated gallstones. Incidentally noted tiny fat-containing umbilical hernia LFTs and lipase were normal at that time. She did have a mild lipase elevation in the past at 55 but has been normal on more recent checks  PMH: Low vitamin D well controlled with cholecalciferol  PSH: Left foot bunion removal; abnormal Pap smear in 1980s for which she had laser treatment of her cervix and she denies any prior abdominal operations  FHx: Her sister had ovarian cancer  Social: She is a reformed smoker-quit 3 years ago and reformed alcohol user also quit 3 years ago. Denies illicit drug use. 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  The patient is a 57 year old female.   Past Surgical History (Tanisha A. Owens Shark, [place]; 11/17/2017 2:15 PM) Foot Surgery  Left. Oral Surgery   Diagnostic Studies History (Tanisha A. Owens Shark, [place];  11/17/2017 2:15 PM) Colonoscopy  never Mammogram  1-3 years ago Pap Smear  1-5 years ago  Allergies (Tanisha A. Owens Shark, [place]; 11/17/2017 2:16 PM) No Known Drug Allergies [11/17/2017]: Allergies Reconciled   Medication History (Tanisha A. Owens Shark, RMA; 11/17/2017 2:16 PM) D3-50 (50000UNIT Capsule, Oral) Active. Medications Reconciled  Social History (Tanisha A. Owens Shark, [place]; 11/17/2017 2:15 PM) Alcohol use  Remotely quit alcohol use. Caffeine use  Carbonated beverages, Coffee. Tobacco use  Former smoker.  Family History (Tanisha A. Owens Shark, [place]; 11/17/2017 2:15 PM) Alcohol Abuse  Father. Bleeding disorder  Sister. Heart Disease  Father. Hypertension  Father. Ovarian Cancer  Sister. Respiratory Condition  Father.  Pregnancy / Birth History (Tanisha A. Owens Shark, [place]; 11/17/2017 2:15 PM) Age at menarche  68 years. Age of menopause  33-50 Gravida  3 Maternal age  36-25 Para  1  Other Problems (Tanisha A. Owens Shark, [place]; 11/17/2017 2:15 PM) Back Pain  Cholelithiasis  Gastroesophageal Reflux Disease  High blood pressure  Other disease, cancer, significant illness     Review of Systems (Tanisha A. Brown RMA; 11/17/2017 2:15 PM) General Not Present- Appetite Loss, Chills, Fatigue, Fever, Night Sweats, Weight Gain and Weight Loss. Skin Present- Dryness. Not Present- Change in Wart/Mole, Hives, Jaundice, New Lesions, Non-Healing Wounds, Rash and Ulcer. HEENT Present- Ringing in the Ears, Seasonal Allergies and Sinus Pain. Not Present- Earache, Hearing Loss, Hoarseness, Nose Bleed, Oral Ulcers, Sore Throat, Visual Disturbances, Wears glasses/contact lenses and Yellow Eyes. Respiratory Present- Snoring. Not Present- Bloody sputum, Chronic Cough, Difficulty Breathing and Wheezing. Breast Not Present- Breast Mass, Breast Pain, Nipple Discharge and Skin Changes. Cardiovascular Present- Swelling of Extremities. Not Present- Chest Pain, Difficulty Breathing Lying Down,  Leg Cramps,  Palpitations, Rapid Heart Rate and Shortness of Breath. Gastrointestinal Present- Bloating, Constipation and Excessive gas. Not Present- Abdominal Pain, Bloody Stool, Change in Bowel Habits, Chronic diarrhea, Difficulty Swallowing, Gets full quickly at meals, Hemorrhoids, Indigestion, Nausea, Rectal Pain and Vomiting. Female Genitourinary Not Present- Frequency, Nocturia, Painful Urination, Pelvic Pain and Urgency. Musculoskeletal Present- Back Pain and Swelling of Extremities. Not Present- Joint Pain, Joint Stiffness, Muscle Pain and Muscle Weakness. Neurological Present- Headaches. Not Present- Decreased Memory, Fainting, Numbness, Seizures, Tingling, Tremor, Trouble walking and Weakness. Psychiatric Not Present- Anxiety, Bipolar, Change in Sleep Pattern, Depression, Fearful and Frequent crying. Endocrine Present- Excessive Hunger and Heat Intolerance. Not Present- Cold Intolerance, Hair Changes, Hot flashes and New Diabetes. Hematology Not Present- Blood Thinners, Easy Bruising, Excessive bleeding, Gland problems, HIV and Persistent Infections.  Vitals (Tanisha A. Brown RMA; 11/17/2017 2:16 PM) 11/17/2017 2:15 PM Weight: 163.4 lb Height: 60in Body Surface Area: 1.71 m Body Mass Index: 31.91 kg/m  Temp.: 98.34F  Pulse: 83 (Regular)  BP: 132/84 (Sitting, Left Arm, Standard)       Physical Exam Harrell Gave M. Axel Frisk MD; 11/17/2017 2:44 PM) The physical exam findings are as follows: Note:Constitutional: No acute distress; conversant; no deformities Eyes: Moist conjunctiva; no lid lag; anicteric sclerae; pupils equal round and reactive to light Neck: Trachea midline; no palpable thyromegaly Lungs: Normal respiratory effort; no tactile fremitus CV: Regular rate and rhythm; no palpable thrill; no pitting edema GI: Abdomen soft, nontender, nondistended; no palpable hepatosplenomegaly. Negative Murphy's sign MSK: Normal gait; no clubbing/cyanosis Psychiatric: Appropriate affect;  alert and oriented 3 Lymphatic: No palpable cervical or axillary lymphadenopathy    Assessment & Plan Harrell Gave M. Delorus Langwell MD; 11/17/2017 2:45 PM) SYMPTOMATIC CHOLELITHIASIS (K80.20) Story: Ms. Fargnoli is a very pleasant 49yoF with hx of low Vit D here with symptomatic cholelithasis and incidentally noted fat contraining umbilical hernia on CT Impression: -The anatomy and physiology of the hepatobiliary system was discussed at length with the patient with associated pictures. The pathophysiology of gallbladder disease was discussed at length with associated pictures. -The options for treatment were discussed including ongoing observation which may result in subsequent gallbladder complications (infection, pancreatitis, choledocholithiasis, etc). I recommended cholecystectomy to address the pathology with potentially primary repair of small fat containing umbilical hernia. -The planned procedure, material risks (including, but not limited to, pain, bleeding, infection, scarring, need for blood transfusion, damage to surrounding structures- blood vessels/nerves/viscus/organs, damage to bile duct, bile leak, need for additional procedures, hernia and hernia recurrence, pancreatitis, pneumonia, heart attack, stroke, death) benefits and alternatives to surgery were discussed at length. I noted a good probability that the procedure would help improve their symptoms. The patient's questions were answered to her satisfaction, she voiced understanding and she elected to proceed with surgery. Additionally, we discussed typical postoperative expectations and the recovery process.  Signed electronically by Ileana Roup, MD (11/17/2017 2:46 PM)

## 2017-11-17 NOTE — H&P (Deleted)
CC: F/u visit - hx of laparoscopic cholecystectomy  HPI: Paula Parker is a very pleasant 57yoF with hx of chronic cholecystitis who underwent a laparoscopic cholecystectomy 04/06/17 at which time she was found to have chronic cholecystitis. Pathology confirmed this. Her gallbladder was also intrahepatic. Due to body habitus specimen extraction was somewhat tricky and required removal through the left upper quadrant port site via a cutdown and additional opening of the fascia. The fascia dislocation was then closed with a running #1 PDS suture. She recovered well from her surgery with some expected postoperative discomfort at her left upper quadrant extraction site. This began just improve but over the last couple of months she's noted somewhat more discomfort at this location. She describes a sensation of fullness/bulge at this location. She denies any nausea/vomiting or changes in her bowel habits. The discomfort is achy and worse with prolonged activity and standing. She does have a history of early satiety but this dates back to prior to her operation.  The patient is a 57 year old female.   Allergies Alean Rinne, [place]; 11/17/2017 2:18 PM) Percocet *ANALGESICS - OPIOID*  Difficulty breathing. Allergies Reconciled   Medication History Alean Rinne, [place]; 11/17/2017 2:18 PM) Tylenol Extra Strength (500MG Tablet, Oral) Active. Ibuprofen (200MG Capsule, Oral) Active. Medications Reconciled    Review of Systems Harrell Gave M. Cecilia Nishikawa MD; 11/17/2017 2:56 PM) General Not Present- Appetite Loss, Chills, Fatigue, Fever, Night Sweats, Weight Gain and Weight Loss. Skin Not Present- Change in Wart/Mole, Dryness, Hives, Jaundice, New Lesions, Non-Healing Wounds, Rash and Ulcer. HEENT Present- Yellow Eyes. Not Present- Earache, Hearing Loss, Hoarseness, Nose Bleed, Oral Ulcers, Ringing in the Ears, Seasonal Allergies, Sinus Pain, Sore Throat, Visual Disturbances and Wears glasses/contact  lenses. Respiratory Present- Snoring. Not Present- Bloody sputum, Chronic Cough, Difficulty Breathing and Wheezing. Breast Not Present- Breast Mass, Breast Pain, Nipple Discharge and Skin Changes. Cardiovascular Not Present- Chest Pain, Difficulty Breathing Lying Down, Leg Cramps, Palpitations, Rapid Heart Rate, Shortness of Breath and Swelling of Extremities. Gastrointestinal Present- Abdominal Pain and Gets full quickly at meals. Not Present- Bloating, Bloody Stool, Change in Bowel Habits, Chronic diarrhea, Constipation, Difficulty Swallowing, Excessive gas, Hemorrhoids, Indigestion, Nausea, Rectal Pain and Vomiting. Female Genitourinary Not Present- Frequency, Nocturia, Painful Urination, Pelvic Pain and Urgency. Musculoskeletal Present- Back Pain. Not Present- Joint Pain, Joint Stiffness, Muscle Pain, Muscle Weakness and Swelling of Extremities. Neurological Present- Headaches. Not Present- Decreased Memory, Fainting, Numbness, Seizures, Tingling, Tremor, Trouble walking and Weakness. Psychiatric Not Present- Anxiety, Bipolar, Change in Sleep Pattern, Depression, Fearful and Frequent crying. Endocrine Present- Hair Changes. Not Present- Cold Intolerance, Excessive Hunger, Heat Intolerance, Hot flashes and New Diabetes. Hematology Not Present- Blood Thinners, Easy Bruising, Excessive bleeding, Gland problems, HIV and Persistent Infections.  Vitals Mardene Celeste King RMA; 11/17/2017 2:18 PM) 11/17/2017 2:17 PM Weight: 220.2 lb Height: 61in Body Surface Area: 1.97 m Body Mass Index: 41.61 kg/m  Temp.: 100F  Pulse: 99 (Regular)  BP: 130/80 (Sitting, Left Arm, Standard)       Physical Exam Harrell Gave M. Jase Reep MD; 11/17/2017 2:57 PM) The physical exam findings are as follows: Note:Constitutional: No acute distress; conversant; no deformities Eyes: Moist conjunctiva; no lid lag; anicteric sclerae; pupils equal round and reactive to light Neck: Trachea midline; no palpable  thyromegaly Lungs: Normal respiratory effort; no tactile fremitus CV: Regular rate and rhythm; no palpable thrill; no pitting edema GI: Abdomen soft, nontender, nondistended; no palpable hepatosplenomegaly. Incisions are well-healed. Left upper quadrant extraction site incision tender to palpation but there is  no palpable bulge or reducible contents. Her habitus does somewhat limit this exam but there is no obvious defects palpable in the fascia. MSK: Normal gait; no clubbing/cyanosis Psychiatric: Appropriate affect; alert and oriented 3 Lymphatic: No palpable cervical or axillary lymphadenopathy    Assessment & Plan Harrell Gave M. Daeron Carreno MD; 11/17/2017 2:58 PM) ABDOMINAL PAIN (R10.9) Story: Paula Parker is a very pleasant 57yoF with morbid obesity here today for re-evaluation following laparoscopic cholecystomy 04/06/17. Having discomfort at LUQ cutdown site which was necessary to remove her gallbladder. Impression: -Will obtain CT abdomen and pelvis with by mouth and IV contrast for further evaluation of her discomfort and possible left upper quadrant abdominal wall hernia. -We'll have her return the office following this for follow-up and further treatment -I recommended in the interim she began taking Tylenol for her pain and as needed ibuprofen (600 mg every 6 hours as needed)  Signed electronically by Ileana Roup, MD (11/17/2017 2:59 PM)

## 2017-12-15 ENCOUNTER — Ambulatory Visit (INDEPENDENT_AMBULATORY_CARE_PROVIDER_SITE_OTHER): Payer: 59 | Admitting: Internal Medicine

## 2017-12-15 ENCOUNTER — Ambulatory Visit (INDEPENDENT_AMBULATORY_CARE_PROVIDER_SITE_OTHER)
Admission: RE | Admit: 2017-12-15 | Discharge: 2017-12-15 | Disposition: A | Discharge status: Home / Self Care | Payer: 59 | Source: Ambulatory Visit | Attending: Internal Medicine | Admitting: Internal Medicine

## 2017-12-15 ENCOUNTER — Encounter: Payer: Self-pay | Admitting: Internal Medicine

## 2017-12-15 VITALS — BP 156/104 | HR 68 | Temp 98.7°F | Resp 16 | Ht 61.0 in | Wt 164.5 lb

## 2017-12-15 DIAGNOSIS — J988 Other specified respiratory disorders: Secondary | ICD-10-CM | POA: Diagnosis not present

## 2017-12-15 DIAGNOSIS — R05 Cough: Secondary | ICD-10-CM

## 2017-12-15 DIAGNOSIS — I10 Essential (primary) hypertension: Secondary | ICD-10-CM

## 2017-12-15 DIAGNOSIS — R059 Cough, unspecified: Secondary | ICD-10-CM

## 2017-12-15 MED ORDER — HYDROCODONE-HOMATROPINE 5-1.5 MG/5ML PO SYRP: 5.0000 mL | ORAL_SOLUTION | Freq: Three times a day (TID) | ORAL | 0 refills | Status: DC | PRN

## 2017-12-15 MED ORDER — AZILSARTAN-CHLORTHALIDONE 40-12.5 MG PO TABS: 1.0000 | ORAL_TABLET | Freq: Every day | ORAL | 0 refills | Status: DC

## 2017-12-15 MED ORDER — AZITHROMYCIN 500 MG PO TABS: 500.0000 mg | ORAL_TABLET | Freq: Every day | ORAL | 0 refills | Status: AC

## 2017-12-15 MED ORDER — HYDROCODONE-HOMATROPINE 5-1.5 MG/5ML PO SYRP: 5.0000 mL | ORAL_SOLUTION | Freq: Three times a day (TID) | ORAL | 0 refills | Status: AC | PRN

## 2017-12-15 MED FILL — HYDROCODONE-HOMATROPINE SOL: 5-1.5 | 8 days supply | Qty: 120 | Fill #0

## 2017-12-15 MED FILL — AZITHROMYCIN 500 MG TABLET: 500 | 3 days supply | Qty: 3 | Fill #0

## 2017-12-15 NOTE — Patient Instructions (Signed)

## 2017-12-15 NOTE — Progress Notes (Signed)
Subjective:  Patient ID: Paula Parker, female    DOB: 05-Sep-1960  Age: 57 y.o. MRN: [id number]  CC: Cough and Hypertension   HPI Paula Parker presents for 2 concerns.  1.  She complains of a 3-day history of cough productive of thick yellow/green phlegm with night sweats.  She denies fever, chills, shortness of breath, or wheezing.  She quit smoking about 4 years ago.  2.  She also complains that her blood pressure has not been well controlled and she has had a few episodes recently of blurred vision.  She denies headache, nausea, vomiting, chest pain, or shortness of breath.  Outpatient Medications Prior to Visit  Medication Sig Dispense Refill  . Cholecalciferol 50000 units capsule Take 1 capsule (50,000 Units total) by mouth once a week. 12 capsule 1  . levocetirizine (XYZAL) 5 MG tablet Take 1 tablet (5 mg total) by mouth every evening. 90 tablet 1   No facility-administered medications prior to visit.     ROS Review of Systems  Constitutional: Negative for chills, diaphoresis, fatigue and fever.  HENT: Negative.   Eyes: Positive for visual disturbance. Negative for pain.  Respiratory: Positive for cough. Negative for chest tightness, shortness of breath and wheezing.   Cardiovascular: Negative.  Negative for chest pain, palpitations and leg swelling.  Gastrointestinal: Negative for abdominal pain, constipation, diarrhea, nausea and vomiting.  Endocrine: Negative.   Genitourinary: Negative.  Negative for difficulty urinating, dysuria and hematuria.  Musculoskeletal: Negative.  Negative for arthralgias and myalgias.  Skin: Negative for color change, pallor and rash.  Neurological: Negative.  Negative for dizziness, weakness and light-headedness.  Hematological: Negative for adenopathy. Does not bruise/bleed easily.  Psychiatric/Behavioral: Negative.     Objective:  BP (!) 156/104 (BP Location: Left Arm, Patient Position: Sitting, Cuff Size: Normal)   Pulse 68   Temp 98.7  F (37.1 C) (Oral)   Resp 16   Ht 5' 1"  (1.549 m)   Wt 164 lb 8 oz (74.6 kg)   SpO2 98%   BMI 31.08 kg/m   BP Readings from Last 3 Encounters:  12/15/17 (!) 156/104  10/11/17 134/80  07/08/17 118/90    Wt Readings from Last 3 Encounters:  12/15/17 164 lb 8 oz (74.6 kg)  10/11/17 158 lb (71.7 kg)  07/08/17 165 lb 6.4 oz (75 kg)    Physical Exam  Constitutional: She is oriented to person, place, and time. No distress.  HENT:  Mouth/Throat: Oropharynx is clear and moist. No oropharyngeal exudate.  Eyes: Conjunctivae are normal. No scleral icterus.  Neck: Normal range of motion. Neck supple. No JVD present. No thyromegaly present.  Cardiovascular: Normal rate, regular rhythm and normal heart sounds. Exam reveals no gallop and no friction rub.  No murmur heard. Pulmonary/Chest: Effort normal and breath sounds normal. She has no wheezes. She has no rhonchi. She has no rales.  Abdominal: Soft. Normal appearance and bowel sounds are normal. She exhibits no mass. There is no hepatosplenomegaly. There is no tenderness.  Musculoskeletal: Normal range of motion. She exhibits no edema, tenderness or deformity.  Lymphadenopathy:    She has no cervical adenopathy.  Neurological: She is alert and oriented to person, place, and time.  Skin: Skin is warm and dry. She is not diaphoretic. No pallor.  Vitals reviewed.   Lab Results  Component Value Date   WBC 5.8 10/11/2017   HGB 14.3 10/11/2017   HCT 43.1 10/11/2017   PLT 256.0 10/11/2017   GLUCOSE 96 10/11/2017  CHOL 172 10/11/2017   TRIG 74.0 10/11/2017   HDL 76.80 10/11/2017   LDLCALC 81 10/11/2017   ALT 21 10/11/2017   AST 23 10/11/2017   NA 141 10/11/2017   K 4.1 10/11/2017   CL 105 10/11/2017   CREATININE 0.78 10/11/2017   BUN 15 10/11/2017   CO2 31 10/11/2017   TSH 4.00 06/19/2015    Ct Abdomen Pelvis W Contrast  Result Date: 10/14/2017 CLINICAL DATA:  Right lower quadrant pain and tenderness for 1 month. EXAM: CT  ABDOMEN AND PELVIS WITH CONTRAST TECHNIQUE: Multidetector CT imaging of the abdomen and pelvis was performed using the standard protocol following bolus administration of intravenous contrast. CONTRAST:  135m ISOVUE-300 IOPAMIDOL (ISOVUE-300) INJECTION 61% COMPARISON:  12/20/2015 FINDINGS: Lower Chest: No acute findings. Hepatobiliary: No hepatic masses identified. Stable tiny sub-cm cyst in liver dome. Tiny gallstone again noted, without evidence of cholecystitis or biliary ductal dilatation. Pancreas:  No mass or inflammatory changes. Spleen: Within normal limits in size and appearance. Adrenals/Urinary Tract: No masses identified. No evidence of hydronephrosis. Stomach/Bowel: No evidence of obstruction, inflammatory process or abnormal fluid collections. Normal appendix visualized. Mild diverticulosis of descending colon is seen, without evidence of diverticulitis. Vascular/Lymphatic: No pathologically enlarged lymph nodes. No abdominal aortic aneurysm. Reproductive:  No mass or other significant abnormality. Other:  None. Musculoskeletal:  No suspicious bone lesions identified. IMPRESSION: No evidence of appendicitis or other acute findings. Cholelithiasis.  No radiographic evidence of cholecystitis. Colonic diverticulosis, without radiographic evidence of diverticulitis. Electronically Signed   By: JEarle GellM.D.   On: 10/14/2017 15:25    Assessment & Plan:   LSaronwas seen today for cough and hypertension.  Diagnoses and all orders for this visit:  Essential hypertension, benign- Her blood pressure is not well controlled and she is symptomatic.  Will start an ARB and thiazide diuretic. -     Azilsartan-Chlorthalidone (EDARBYCLOR) 40-12.5 MG TABS; Take 1 tablet by mouth daily.  RTI (respiratory tract infection)- I will treat the infection with Zithromax.  Will control the cough with Hycodan as needed. -     Discontinue: HYDROcodone-homatropine (HYCODAN) 5-1.5 MG/5ML syrup; Take 5 mLs by mouth  every 8 (eight) hours as needed for up to 7 days for cough. -     HYDROcodone-homatropine (HYCODAN) 5-1.5 MG/5ML syrup; Take 5 mLs by mouth every 8 (eight) hours as needed for up to 7 days for cough. -     azithromycin (ZITHROMAX) 500 MG tablet; Take 1 tablet (500 mg total) by mouth daily for 3 days.  Cough- Her chest x-ray is negative for mass or infiltrate. -     Discontinue: HYDROcodone-homatropine (HYCODAN) 5-1.5 MG/5ML syrup; Take 5 mLs by mouth every 8 (eight) hours as needed for up to 7 days for cough. -     DG Chest 2 View; Future -     HYDROcodone-homatropine (HYCODAN) 5-1.5 MG/5ML syrup; Take 5 mLs by mouth every 8 (eight) hours as needed for up to 7 days for cough.   I have discontinued LMileeSmith's levocetirizine. I am also having her start on azithromycin and Azilsartan-Chlorthalidone. Additionally, I am having her maintain her Cholecalciferol and HYDROcodone-homatropine.  Meds ordered this encounter  Medications  . DISCONTD: HYDROcodone-homatropine (HYCODAN) 5-1.5 MG/5ML syrup    Sig: Take 5 mLs by mouth every 8 (eight) hours as needed for up to 7 days for cough.    Dispense:  120 mL    Refill:  0  . HYDROcodone-homatropine (HYCODAN) 5-1.5 MG/5ML syrup  Sig: Take 5 mLs by mouth every 8 (eight) hours as needed for up to 7 days for cough.    Dispense:  120 mL    Refill:  0  . azithromycin (ZITHROMAX) 500 MG tablet    Sig: Take 1 tablet (500 mg total) by mouth daily for 3 days.    Dispense:  3 tablet    Refill:  0  . Azilsartan-Chlorthalidone (EDARBYCLOR) 40-12.5 MG TABS    Sig: Take 1 tablet by mouth daily.    Dispense:  42 tablet    Refill:  0     Follow-up: Return in about 4 weeks (around 01/12/2018).  Scarlette Calico, MD

## 2017-12-19 ENCOUNTER — Encounter: Payer: Self-pay | Admitting: Internal Medicine

## 2017-12-20 ENCOUNTER — Encounter: Payer: Self-pay | Admitting: Internal Medicine

## 2017-12-29 ENCOUNTER — Encounter: Payer: Self-pay | Admitting: Internal Medicine

## 2018-01-02 ENCOUNTER — Encounter: Payer: Self-pay | Admitting: Internal Medicine

## 2018-01-12 ENCOUNTER — Encounter: Payer: Self-pay | Admitting: Internal Medicine

## 2018-01-12 ENCOUNTER — Other Ambulatory Visit (INDEPENDENT_AMBULATORY_CARE_PROVIDER_SITE_OTHER): Payer: 59

## 2018-01-12 ENCOUNTER — Ambulatory Visit (INDEPENDENT_AMBULATORY_CARE_PROVIDER_SITE_OTHER): Payer: 59 | Admitting: Internal Medicine

## 2018-01-12 VITALS — BP 126/84 | HR 70 | Temp 98.4°F | Resp 16 | Ht 61.0 in | Wt 164.0 lb

## 2018-01-12 DIAGNOSIS — Z1239 Encounter for other screening for malignant neoplasm of breast: Secondary | ICD-10-CM

## 2018-01-12 DIAGNOSIS — I10 Essential (primary) hypertension: Secondary | ICD-10-CM | POA: Diagnosis not present

## 2018-01-12 DIAGNOSIS — E2839 Other primary ovarian failure: Secondary | ICD-10-CM

## 2018-01-12 LAB — BASIC METABOLIC PANEL
BUN: 23 mg/dL (ref 6–23)
CALCIUM: 9.7 mg/dL (ref 8.4–10.5)
CO2: 31 mEq/L (ref 19–32)
CREATININE: 0.95 mg/dL (ref 0.40–1.20)
Chloride: 102 mEq/L (ref 96–112)
GFR: 64.6 mL/min (ref >60.00)
GLUCOSE: 92 mg/dL (ref 70–99)
POTASSIUM: 4.1 meq/L (ref 3.5–5.1)
Sodium: 141 mEq/L (ref 135–145)

## 2018-01-12 MED ORDER — AZILSARTAN-CHLORTHALIDONE 40-12.5 MG PO TABS: 1.0000 | ORAL_TABLET | Freq: Every day | ORAL | 1 refills | Status: DC

## 2018-01-12 NOTE — Patient Instructions (Signed)

## 2018-01-12 NOTE — Progress Notes (Signed)
Subjective:  Patient ID: Paula Parker, female    DOB: November 14, 1960  Age: 57 y.o. MRN: [id number]  CC: Hypertension   HPI Grazia Taffe presents for a BP check - She feels well and offers no complaints.  She tells me her blood pressure has been well controlled.  She denies dizziness or lightheadedness.  Outpatient Medications Prior to Visit  Medication Sig Dispense Refill  . Cholecalciferol 50000 units capsule Take 1 capsule (50,000 Units total) by mouth once a week. 12 capsule 1  . Azilsartan-Chlorthalidone (EDARBYCLOR) 40-12.5 MG TABS Take 1 tablet by mouth daily. 42 tablet 0   No facility-administered medications prior to visit.     ROS Review of Systems  Constitutional: Negative for diaphoresis and fatigue.  HENT: Negative.   Eyes: Negative for visual disturbance.  Respiratory: Negative for cough, chest tightness, shortness of breath and wheezing.   Cardiovascular: Negative for chest pain and palpitations.  Gastrointestinal: Negative for abdominal pain and nausea.  Genitourinary: Negative.  Negative for difficulty urinating.  Musculoskeletal: Negative.   Skin: Negative.   Neurological: Negative for dizziness, weakness and light-headedness.  Hematological: Negative for adenopathy. Does not bruise/bleed easily.  Psychiatric/Behavioral: Negative.     Objective:  BP 126/84 (BP Location: Left Arm, Patient Position: Sitting, Cuff Size: Normal)   Pulse 70   Temp 98.4 F (36.9 C) (Oral)   Resp 16   Ht 5' 1"  (1.549 m)   Wt 164 lb (74.4 kg)   SpO2 97%   BMI 30.99 kg/m   BP Readings from Last 3 Encounters:  01/12/18 126/84  12/15/17 (!) 156/104  10/11/17 134/80    Wt Readings from Last 3 Encounters:  01/12/18 164 lb (74.4 kg)  12/15/17 164 lb 8 oz (74.6 kg)  10/11/17 158 lb (71.7 kg)    Physical Exam  Constitutional: She is oriented to person, place, and time. No distress.  HENT:  Mouth/Throat: Oropharynx is clear and moist. No oropharyngeal exudate.  Eyes:  Conjunctivae are normal. No scleral icterus.  Neck: Normal range of motion. Neck supple. No JVD present. No thyromegaly present.  Cardiovascular: Normal rate, regular rhythm and normal heart sounds. Exam reveals no gallop.  No murmur heard. Pulmonary/Chest: Effort normal and breath sounds normal. No respiratory distress. She has no wheezes. She has no rales.  Abdominal: Soft. Bowel sounds are normal. She exhibits no mass. There is no hepatosplenomegaly. There is no tenderness.  Musculoskeletal: Normal range of motion. She exhibits no edema, tenderness or deformity.  Lymphadenopathy:    She has no cervical adenopathy.  Neurological: She is alert and oriented to person, place, and time.  Skin: Skin is warm and dry. She is not diaphoretic. No pallor.  Vitals reviewed.   Lab Results  Component Value Date   WBC 5.8 10/11/2017   HGB 14.3 10/11/2017   HCT 43.1 10/11/2017   PLT 256.0 10/11/2017   GLUCOSE 92 01/12/2018   CHOL 172 10/11/2017   TRIG 74.0 10/11/2017   HDL 76.80 10/11/2017   LDLCALC 81 10/11/2017   ALT 21 10/11/2017   AST 23 10/11/2017   NA 141 01/12/2018   K 4.1 01/12/2018   CL 102 01/12/2018   CREATININE 0.95 01/12/2018   BUN 23 01/12/2018   CO2 31 01/12/2018   TSH 4.00 06/19/2015    Dg Chest 2 View  Result Date: 12/15/2017 CLINICAL DATA:  Cough 4 days. EXAM: CHEST - 2 VIEW COMPARISON:  05/13/2014 FINDINGS: Cardiomediastinal silhouette is normal. Mediastinal contours appear intact. There is no evidence  of focal airspace consolidation, pleural effusion or pneumothorax. Osseous structures are without acute abnormality. Soft tissues are grossly normal. IMPRESSION: No active cardiopulmonary disease. Electronically Signed   By: Fidela Salisbury M.D.   On: 12/15/2017 16:17    Assessment & Plan:   Afra was seen today for hypertension.  Diagnoses and all orders for this visit:  Estrogen deficiency -     DG Bone Density; Future  Breast cancer screening -     MM  Digital Screening; Future  Essential hypertension, benign- Her blood pressure is well controlled.  Electrolytes and renal function are normal.  Will continue current combination of an ARB and thiazide diuretic. -     Azilsartan-Chlorthalidone (EDARBYCLOR) 40-12.5 MG TABS; Take 1 tablet by mouth daily. -     Basic metabolic panel; Future   I am having Paula Parker maintain her Cholecalciferol and Azilsartan-Chlorthalidone.  Meds ordered this encounter  Medications  . Azilsartan-Chlorthalidone (EDARBYCLOR) 40-12.5 MG TABS    Sig: Take 1 tablet by mouth daily.    Dispense:  90 tablet    Refill:  1     Follow-up: Return in about 6 months (around 07/14/2018).  Scarlette Calico, MD

## 2018-01-13 ENCOUNTER — Encounter: Payer: Self-pay | Admitting: Internal Medicine

## 2018-01-17 ENCOUNTER — Encounter: Payer: Self-pay | Admitting: Internal Medicine

## 2018-01-19 NOTE — Patient Instructions (Addendum)
Ardeth Repetto  01/19/2018   Your procedure is scheduled on: Friday 02/03/2018  Report to [facility] Main  Entrance              Report to admitting at  0830  AM    Call this number if you have problems the morning of surgery [phone]    Remember: Do not eat food or drink liquids :After Midnight.               BRUSH YOUR TEETH MORNING OF SURGERY AND RINSE YOUR MOUTH OUT, NO CHEWING GUM CANDY OR MINTS.     Take these medicines the morning of surgery with A SIP OF WATER: none                                You may not have any metal on your body including hair pins and              piercings  Do not wear jewelry, make-up, lotions, powders or perfumes, deodorant             Do not wear nail polish.  Do not shave  48 hours prior to surgery.                Do not bring valuables to the hospital. [place].  Contacts, dentures or bridgework may not be worn into surgery.  Leave suitcase in the car. After surgery it may be brought to your room.     Patients discharged the day of surgery will not be allowed to drive home.  Name and phone number of your driver:-Daughter Dallas Breeding               Please read over the following fact sheets you were given: _____________________________________________________________________             [facility] - Preparing for Surgery Before surgery, you can play an important role.  Because skin is not sterile, your skin needs to be as free of germs as possible.  You can reduce the number of germs on your skin by washing with CHG (chlorahexidine gluconate) soap before surgery.  CHG is an antiseptic cleaner which kills germs and bonds with the skin to continue killing germs even after washing. Please DO NOT use if you have an allergy to CHG or antibacterial soaps.  If your skin becomes reddened/irritated stop using the CHG and inform your nurse when you arrive at Short  Stay. Do not shave (including legs and underarms) for at least 48 hours prior to the first CHG shower.  You may shave your face/neck. Please follow these instructions carefully:  1.  Shower with CHG Soap the night before surgery and the  morning of Surgery.  2.  If you choose to wash your hair, wash your hair first as usual with your  normal  shampoo.  3.  After you shampoo, rinse your hair and body thoroughly to remove the  shampoo.                           4.  Use CHG as you would any other liquid soap.  You can apply chg directly  to the  skin and wash                       Gently with a scrungie or clean washcloth.  5.  Apply the CHG Soap to your body ONLY FROM THE NECK DOWN.   Do not use on face/ open                           Wound or open sores. Avoid contact with eyes, ears mouth and genitals (private parts).                       Wash face,  Genitals (private parts) with your normal soap.             6.  Wash thoroughly, paying special attention to the area where your surgery  will be performed.  7.  Thoroughly rinse your body with warm water from the neck down.  8.  DO NOT shower/wash with your normal soap after using and rinsing off  the CHG Soap.                9.  Pat yourself dry with a clean towel.            10.  Wear clean pajamas.            11.  Place clean sheets on your bed the night of your first shower and do not  sleep with pets. Day of Surgery : Do not apply any lotions/deodorants the morning of surgery.  Please wear clean clothes to the hospital/surgery center.  FAILURE TO FOLLOW THESE INSTRUCTIONS MAY RESULT IN THE CANCELLATION OF YOUR SURGERY PATIENT SIGNATURE_________________________________  NURSE SIGNATURE__________________________________  ________________________________________________________________________

## 2018-01-20 ENCOUNTER — Encounter: Payer: Self-pay | Admitting: Internal Medicine

## 2018-01-23 ENCOUNTER — Encounter: Payer: Self-pay | Admitting: Internal Medicine

## 2018-01-25 ENCOUNTER — Encounter: Payer: Self-pay | Admitting: Internal Medicine

## 2018-01-26 ENCOUNTER — Other Ambulatory Visit: Payer: Self-pay

## 2018-01-26 ENCOUNTER — Encounter (HOSPITAL_COMMUNITY): Payer: Self-pay

## 2018-01-26 ENCOUNTER — Encounter (HOSPITAL_COMMUNITY)
Admission: RE | Admit: 2018-01-26 | Discharge: 2018-01-26 | Disposition: A | Discharge status: Home / Self Care | Payer: 59 | Source: Ambulatory Visit | Attending: Surgery | Admitting: Surgery

## 2018-01-26 DIAGNOSIS — Z01818 Encounter for other preprocedural examination: Secondary | ICD-10-CM | POA: Diagnosis not present

## 2018-01-26 DIAGNOSIS — I10 Essential (primary) hypertension: Secondary | ICD-10-CM | POA: Diagnosis not present

## 2018-01-26 DIAGNOSIS — K429 Umbilical hernia without obstruction or gangrene: Secondary | ICD-10-CM | POA: Insufficient documentation

## 2018-01-26 DIAGNOSIS — K8020 Calculus of gallbladder without cholecystitis without obstruction: Secondary | ICD-10-CM | POA: Insufficient documentation

## 2018-01-26 LAB — BASIC METABOLIC PANEL
ANION GAP: 9 (ref 5–15)
BUN: 30 mg/dL — ABNORMAL HIGH (ref 6–20)
CALCIUM: 9.6 mg/dL (ref 8.9–10.3)
CO2: 30 mmol/L (ref 22–32)
Chloride: 102 mmol/L (ref 98–111)
Creatinine, Ser: 0.84 mg/dL (ref 0.44–1.00)
GFR calc Af Amer: >60 mL/min (ref >60)
GLUCOSE: 94 mg/dL (ref 70–99)
Potassium: 4.2 mmol/L (ref 3.5–5.1)
SODIUM: 141 mmol/L (ref 135–145)

## 2018-01-26 LAB — CBC
HCT: 43.8 % (ref 36.0–46.0)
Hemoglobin: 14 g/dL (ref 12.0–15.0)
MCH: 31.2 pg (ref 26.0–34.0)
MCHC: 32 g/dL (ref 30.0–36.0)
MCV: 97.6 fL (ref 80.0–100.0)
PLATELETS: 254 10*3/uL (ref 150–400)
RBC: 4.49 MIL/uL (ref 3.87–5.11)
RDW: 12.7 % (ref 11.5–15.5)
WBC: 4.6 10*3/uL (ref 4.0–10.5)
nRBC: 0 % (ref 0.0–0.2)

## 2018-01-26 LAB — ABO/RH: ABO/RH(D): A POS

## 2018-01-26 MED FILL — VIT D3-50 50,000 UNITS CAPS: 1.25 MG | 84 days supply | Qty: 12 | Fill #1

## 2018-01-26 NOTE — Progress Notes (Signed)
bmet results routed to dr Harrell Gave white by epic

## 2018-02-03 ENCOUNTER — Ambulatory Visit (HOSPITAL_COMMUNITY)
Admission: RE | Admit: 2018-02-03 | Discharge: 2018-02-03 | Disposition: A | Discharge status: Home / Self Care | Payer: 59 | Source: Ambulatory Visit | Attending: Surgery | Admitting: Surgery

## 2018-02-03 ENCOUNTER — Ambulatory Visit (HOSPITAL_COMMUNITY): Payer: 59 | Admitting: Anesthesiology

## 2018-02-03 ENCOUNTER — Encounter (HOSPITAL_COMMUNITY)
Admission: RE | Disposition: A | Discharge status: Home / Self Care | Payer: Self-pay | Source: Ambulatory Visit | Attending: Surgery

## 2018-02-03 ENCOUNTER — Encounter (HOSPITAL_COMMUNITY): Payer: Self-pay | Admitting: Emergency Medicine

## 2018-02-03 DIAGNOSIS — K8010 Calculus of gallbladder with chronic cholecystitis without obstruction: Secondary | ICD-10-CM | POA: Diagnosis not present

## 2018-02-03 DIAGNOSIS — K8020 Calculus of gallbladder without cholecystitis without obstruction: Secondary | ICD-10-CM | POA: Diagnosis not present

## 2018-02-03 DIAGNOSIS — Z882 Allergy status to sulfonamides status: Secondary | ICD-10-CM | POA: Insufficient documentation

## 2018-02-03 DIAGNOSIS — I10 Essential (primary) hypertension: Secondary | ICD-10-CM | POA: Diagnosis not present

## 2018-02-03 DIAGNOSIS — E559 Vitamin D deficiency, unspecified: Secondary | ICD-10-CM | POA: Insufficient documentation

## 2018-02-03 DIAGNOSIS — E785 Hyperlipidemia, unspecified: Secondary | ICD-10-CM | POA: Insufficient documentation

## 2018-02-03 DIAGNOSIS — M1990 Unspecified osteoarthritis, unspecified site: Secondary | ICD-10-CM | POA: Diagnosis not present

## 2018-02-03 DIAGNOSIS — R599 Enlarged lymph nodes, unspecified: Secondary | ICD-10-CM | POA: Insufficient documentation

## 2018-02-03 DIAGNOSIS — Z880 Allergy status to penicillin: Secondary | ICD-10-CM | POA: Insufficient documentation

## 2018-02-03 DIAGNOSIS — E669 Obesity, unspecified: Secondary | ICD-10-CM | POA: Insufficient documentation

## 2018-02-03 DIAGNOSIS — Z87891 Personal history of nicotine dependence: Secondary | ICD-10-CM | POA: Diagnosis not present

## 2018-02-03 DIAGNOSIS — K219 Gastro-esophageal reflux disease without esophagitis: Secondary | ICD-10-CM | POA: Diagnosis not present

## 2018-02-03 DIAGNOSIS — Z6832 Body mass index (BMI) 32.0-32.9, adult: Secondary | ICD-10-CM | POA: Insufficient documentation

## 2018-02-03 DIAGNOSIS — K811 Chronic cholecystitis: Secondary | ICD-10-CM | POA: Diagnosis not present

## 2018-02-03 HISTORY — PX: CHOLECYSTECTOMY: SHX55

## 2018-02-03 LAB — TYPE AND SCREEN
ABO/RH(D): A POS
Antibody Screen: NEGATIVE

## 2018-02-03 SURGERY — LAPAROSCOPIC CHOLECYSTECTOMY
Anesthesia: General | Site: Abdomen

## 2018-02-03 MED ORDER — SUCCINYLCHOLINE CHLORIDE 200 MG/10ML IV SOSY
PREFILLED_SYRINGE | INTRAVENOUS | Status: DC | PRN
  Administered 2018-02-03: 100 mg via INTRAVENOUS

## 2018-02-03 MED ORDER — MEPERIDINE HCL 50 MG/ML IJ SOLN: 6.2500 mg | INTRAMUSCULAR | Status: DC | PRN

## 2018-02-03 MED ORDER — MIDAZOLAM HCL 2 MG/2ML IJ SOLN
INTRAMUSCULAR | Status: AC
  Filled 2018-02-03: qty 2

## 2018-02-03 MED ORDER — 0.9 % SODIUM CHLORIDE (POUR BTL) OPTIME
TOPICAL | Status: DC | PRN
  Administered 2018-02-03: 1000 mL

## 2018-02-03 MED ORDER — PROPOFOL 10 MG/ML IV BOLUS
INTRAVENOUS | Status: AC
  Filled 2018-02-03: qty 20

## 2018-02-03 MED ORDER — LIDOCAINE HCL (CARDIAC) PF 100 MG/5ML IV SOSY
PREFILLED_SYRINGE | INTRAVENOUS | Status: DC | PRN
  Administered 2018-02-03: 60 mg via INTRAVENOUS

## 2018-02-03 MED ORDER — TRAMADOL HCL 50 MG PO TABS: 50.0000 mg | ORAL_TABLET | Freq: Four times a day (QID) | ORAL | 0 refills | Status: AC | PRN

## 2018-02-03 MED ORDER — CHLORHEXIDINE GLUCONATE CLOTH 2 % EX PADS: 6.0000 | MEDICATED_PAD | Freq: Once | CUTANEOUS | Status: DC

## 2018-02-03 MED ORDER — GABAPENTIN 300 MG PO CAPS
300.0000 mg | ORAL_CAPSULE | ORAL | Status: AC
  Administered 2018-02-03: 300 mg via ORAL
  Filled 2018-02-03: qty 1

## 2018-02-03 MED ORDER — ROCURONIUM BROMIDE 100 MG/10ML IV SOLN
INTRAVENOUS | Status: DC | PRN
  Administered 2018-02-03: 50 mg via INTRAVENOUS

## 2018-02-03 MED ORDER — FENTANYL CITRATE (PF) 250 MCG/5ML IJ SOLN
INTRAMUSCULAR | Status: AC
  Filled 2018-02-03: qty 5

## 2018-02-03 MED ORDER — HYDROMORPHONE HCL 1 MG/ML IJ SOLN
0.2500 mg | INTRAMUSCULAR | Status: DC | PRN
  Administered 2018-02-03: 0.5 mg via INTRAVENOUS

## 2018-02-03 MED ORDER — HYDROMORPHONE HCL 1 MG/ML IJ SOLN
INTRAMUSCULAR | Status: AC
  Administered 2018-02-03: 0.5 mg via INTRAVENOUS
  Filled 2018-02-03: qty 1

## 2018-02-03 MED ORDER — SUGAMMADEX SODIUM 200 MG/2ML IV SOLN
INTRAVENOUS | Status: AC
  Filled 2018-02-03: qty 2

## 2018-02-03 MED ORDER — CIPROFLOXACIN IN D5W 400 MG/200ML IV SOLN
400.0000 mg | INTRAVENOUS | Status: AC
  Administered 2018-02-03: 400 mg via INTRAVENOUS
  Filled 2018-02-03: qty 200

## 2018-02-03 MED ORDER — SUCCINYLCHOLINE CHLORIDE 200 MG/10ML IV SOSY
PREFILLED_SYRINGE | INTRAVENOUS | Status: AC
  Filled 2018-02-03: qty 10

## 2018-02-03 MED ORDER — BUPIVACAINE-EPINEPHRINE 0.5% -1:200000 IJ SOLN
INTRAMUSCULAR | Status: AC
  Filled 2018-02-03: qty 1

## 2018-02-03 MED ORDER — LACTATED RINGERS IR SOLN
Status: DC | PRN
  Administered 2018-02-03: 1000 mL

## 2018-02-03 MED ORDER — BUPIVACAINE-EPINEPHRINE (PF) 0.5% -1:200000 IJ SOLN
INTRAMUSCULAR | Status: DC | PRN
  Administered 2018-02-03: 50 mL

## 2018-02-03 MED ORDER — PROPOFOL 10 MG/ML IV BOLUS
INTRAVENOUS | Status: DC | PRN
  Administered 2018-02-03: 150 mg via INTRAVENOUS

## 2018-02-03 MED ORDER — MIDAZOLAM HCL 5 MG/5ML IJ SOLN
INTRAMUSCULAR | Status: DC | PRN
  Administered 2018-02-03: 2 mg via INTRAVENOUS

## 2018-02-03 MED ORDER — FENTANYL CITRATE (PF) 100 MCG/2ML IJ SOLN
INTRAMUSCULAR | Status: DC | PRN
  Administered 2018-02-03: 100 ug via INTRAVENOUS
  Administered 2018-02-03: 50 ug via INTRAVENOUS
  Administered 2018-02-03: 100 ug via INTRAVENOUS

## 2018-02-03 MED ORDER — PROMETHAZINE HCL 25 MG/ML IJ SOLN
INTRAMUSCULAR | Status: AC
  Filled 2018-02-03: qty 1

## 2018-02-03 MED ORDER — ONDANSETRON HCL 4 MG/2ML IJ SOLN
INTRAMUSCULAR | Status: AC
  Filled 2018-02-03: qty 2

## 2018-02-03 MED ORDER — LACTATED RINGERS IV SOLN
INTRAVENOUS | Status: DC
  Administered 2018-02-03: 09:00:00 via INTRAVENOUS

## 2018-02-03 MED ORDER — MIDAZOLAM HCL 2 MG/2ML IJ SOLN: 0.5000 mg | Freq: Once | INTRAMUSCULAR | Status: DC | PRN

## 2018-02-03 MED ORDER — SUGAMMADEX SODIUM 200 MG/2ML IV SOLN
INTRAVENOUS | Status: DC | PRN
  Administered 2018-02-03: 200 mg via INTRAVENOUS

## 2018-02-03 MED ORDER — DEXAMETHASONE SODIUM PHOSPHATE 10 MG/ML IJ SOLN
INTRAMUSCULAR | Status: DC | PRN
  Administered 2018-02-03: 6 mg via INTRAVENOUS

## 2018-02-03 MED ORDER — DEXAMETHASONE SODIUM PHOSPHATE 10 MG/ML IJ SOLN
INTRAMUSCULAR | Status: AC
  Filled 2018-02-03: qty 1

## 2018-02-03 MED ORDER — ACETAMINOPHEN 500 MG PO TABS
1000.0000 mg | ORAL_TABLET | ORAL | Status: AC
  Administered 2018-02-03: 1000 mg via ORAL
  Filled 2018-02-03: qty 2

## 2018-02-03 MED ORDER — PROMETHAZINE HCL 25 MG/ML IJ SOLN
6.2500 mg | INTRAMUSCULAR | Status: DC | PRN
  Administered 2018-02-03: 6.25 mg via INTRAVENOUS

## 2018-02-03 MED ORDER — LIDOCAINE 2% (20 MG/ML) 5 ML SYRINGE
INTRAMUSCULAR | Status: AC
  Filled 2018-02-03: qty 5

## 2018-02-03 MED ORDER — HEPARIN SODIUM (PORCINE) 5000 UNIT/ML IJ SOLN
5000.0000 [IU] | Freq: Once | INTRAMUSCULAR | Status: AC
  Administered 2018-02-03: 5000 [IU] via SUBCUTANEOUS
  Filled 2018-02-03: qty 1

## 2018-02-03 MED ORDER — ONDANSETRON HCL 4 MG/2ML IJ SOLN
INTRAMUSCULAR | Status: DC | PRN
  Administered 2018-02-03: 4 mg via INTRAVENOUS

## 2018-02-03 MED FILL — traMADol HCL 50 MG TABS: 50 | 7 days supply | Qty: 28 | Fill #0

## 2018-02-03 SURGICAL SUPPLY — 70 items
ADH SKN CLS APL DERMABOND .7 (GAUZE/BANDAGES/DRESSINGS) ×2
APL SKNCLS STERI-STRIP NONHPOA (GAUZE/BANDAGES/DRESSINGS) ×2
APL SRG 38 LTWT LNG FL B (MISCELLANEOUS)
APPLICATOR ARISTA FLEXITIP XL (MISCELLANEOUS) IMPLANT
APPLIER CLIP 5 13 M/L LIGAMAX5 (MISCELLANEOUS) ×3
APPLIER CLIP ROT 10 11.4 M/L (STAPLE)
APR CLP MED LRG 11.4X10 (STAPLE)
APR CLP MED LRG 5 ANG JAW (MISCELLANEOUS) ×2
BAG RETRIEVAL 10 (BASKET) ×1
BAG SPEC RTRVL LRG 6X4 10 (ENDOMECHANICALS) ×2
BENZOIN TINCTURE PRP APPL 2/3 (GAUZE/BANDAGES/DRESSINGS) ×3 IMPLANT
BINDER ABDOMINAL 12 ML 46-62 (SOFTGOODS) IMPLANT
CABLE HIGH FREQUENCY MONO STRZ (ELECTRODE) ×3 IMPLANT
CHLORAPREP W/TINT 26ML (MISCELLANEOUS) ×3 IMPLANT
CLIP APPLIE 5 13 M/L LIGAMAX5 (MISCELLANEOUS) ×2 IMPLANT
CLIP APPLIE ROT 10 11.4 M/L (STAPLE) IMPLANT
COVER MAYO STAND STRL (DRAPES) IMPLANT
COVER SURGICAL LIGHT HANDLE (MISCELLANEOUS) ×3 IMPLANT
COVER WAND RF STERILE (DRAPES) IMPLANT
DECANTER SPIKE VIAL GLASS SM (MISCELLANEOUS) ×3 IMPLANT
DERMABOND ADVANCED (GAUZE/BANDAGES/DRESSINGS) ×1
DERMABOND ADVANCED .7 DNX12 (GAUZE/BANDAGES/DRESSINGS) ×2 IMPLANT
DEVICE SECURE STRAP 25 ABSORB (INSTRUMENTS) IMPLANT
DISSECTOR BLUNT TIP ENDO 5MM (MISCELLANEOUS) IMPLANT
DRAPE C-ARM 42X120 X-RAY (DRAPES) IMPLANT
DRAPE LAPAROSCOPIC ABDOMINAL (DRAPES) ×3 IMPLANT
DRSG TEGADERM 2-3/8X2-3/4 SM (GAUZE/BANDAGES/DRESSINGS) IMPLANT
ELECT PENCIL ROCKER SW 15FT (MISCELLANEOUS) ×3 IMPLANT
ELECT REM PT RETURN 15FT ADLT (MISCELLANEOUS) ×3 IMPLANT
GLOVE BIO SURGEON STRL SZ7.5 (GLOVE) ×3 IMPLANT
GLOVE INDICATOR 8.0 STRL GRN (GLOVE) ×6 IMPLANT
GOWN STRL REUS W/TWL XL LVL3 (GOWN DISPOSABLE) ×8 IMPLANT
GRASPER SUT TROCAR 14GX15 (MISCELLANEOUS) ×3 IMPLANT
HEMOSTAT ARISTA ABSORB 3G PWDR (MISCELLANEOUS) IMPLANT
HEMOSTAT SNOW SURGICEL 2X4 (HEMOSTASIS) IMPLANT
KIT BASIN OR (CUSTOM PROCEDURE TRAY) ×3 IMPLANT
MARKER SKIN DUAL TIP RULER LAB (MISCELLANEOUS) ×3 IMPLANT
NDL INSUFFLATION 14GA 120MM (NEEDLE) ×1 IMPLANT
NDL SPNL 22GX3.5 QUINCKE BK (NEEDLE) ×1 IMPLANT
NEEDLE INSUFFLATION 14GA 120MM (NEEDLE) IMPLANT
NEEDLE SPNL 22GX3.5 QUINCKE BK (NEEDLE) ×3 IMPLANT
PAD POSITIONING PINK XL (MISCELLANEOUS) IMPLANT
POSITIONER SURGICAL ARM (MISCELLANEOUS) IMPLANT
POUCH SPECIMEN RETRIEVAL 10MM (ENDOMECHANICALS) ×3 IMPLANT
SCISSORS LAP 5X35 DISP (ENDOMECHANICALS) ×3 IMPLANT
SET CHOLANGIOGRAPH MIX (MISCELLANEOUS) IMPLANT
SET IRRIG TUBING LAPAROSCOPIC (IRRIGATION / IRRIGATOR) ×3 IMPLANT
SHEARS HARMONIC ACE PLUS 36CM (ENDOMECHANICALS) IMPLANT
SLEEVE ADV FIXATION 5X100MM (TROCAR) ×6 IMPLANT
SLEEVE XCEL OPT CAN 5 100 (ENDOMECHANICALS) ×3 IMPLANT
STRIP CLOSURE SKIN 1/2X4 (GAUZE/BANDAGES/DRESSINGS) ×3 IMPLANT
SUT ETHIBOND 0 MO6 C/R (SUTURE) IMPLANT
SUT MNCRL AB 4-0 PS2 18 (SUTURE) ×3 IMPLANT
SUT NOVA NAB DX-16 0-1 5-0 T12 (SUTURE) IMPLANT
SUT PROLENE 0 CT 1 CR/8 (SUTURE) IMPLANT
SYR 10ML ECCENTRIC (SYRINGE) ×3 IMPLANT
SYS BAG RETRIEVAL 10MM (BASKET) ×2
SYSTEM BAG RETRIEVAL 10MM (BASKET) ×1 IMPLANT
TACKER 5MM HERNIA 3.5CML NAB (ENDOMECHANICALS) IMPLANT
TAPE CLOTH 4X10 WHT NS (GAUZE/BANDAGES/DRESSINGS) IMPLANT
TOWEL OR 17X26 10 PK STRL BLUE (TOWEL DISPOSABLE) ×3 IMPLANT
TOWEL OR NON WOVEN STRL DISP B (DISPOSABLE) IMPLANT
TRAY FOLEY MTR SLVR 16FR STAT (SET/KITS/TRAYS/PACK) IMPLANT
TRAY LAPAROSCOPIC (CUSTOM PROCEDURE TRAY) ×3 IMPLANT
TROCAR ADV FIXATION 12X100MM (TROCAR) IMPLANT
TROCAR ADV FIXATION 5X100MM (TROCAR) ×3 IMPLANT
TROCAR BLADELESS OPT 5 100 (ENDOMECHANICALS) ×3 IMPLANT
TROCAR XCEL BLUNT TIP 100MML (ENDOMECHANICALS) ×3 IMPLANT
TROCAR XCEL NON-BLD 11X100MML (ENDOMECHANICALS) IMPLANT
TUBING INSUF HEATED (TUBING) ×3 IMPLANT

## 2018-02-03 NOTE — Discharge Instructions (Signed)
POST OP INSTRUCTIONS  1. DIET: As tolerated. Follow a light bland diet the first 24 hours after arrival home, such as soup, liquids, crackers, etc.  Be sure to include lots of fluids daily.  Avoid fast food or heavy meals as your are more likely to get nauseated.  Eat a low fat the next few days after surgery.  2. Take your usually prescribed home medications unless otherwise directed.  3. PAIN CONTROL: a. Pain is best controlled by a usual combination of three different methods TOGETHER: i. Ice/Heat ii. Over the counter pain medication iii. Prescription pain medication b. Most patients will experience some swelling and bruising around the surgical site.  Ice packs or heating pads (30-60 minutes up to 6 times a day) will help. Some people prefer to use ice alone, heat alone, alternating between ice & heat.  Experiment to what works for you.  Swelling and bruising can take several weeks to resolve.   c. It is helpful to take an over-the-counter pain medication regularly for the first few weeks: i. Ibuprofen (Motrin/Advil) - 234m tabs - take 3 tabs (603m every 6 hours as needed for pain ii. Acetaminophen (Tylenol) - you may take 65081mvery 6 hours as needed. You can take this with motrin as they act differently on the body. If you are taking a narcotic pain medication that has acetaminophen in it, do not take over the counter tylenol at the same time.  Iii. NOTE: You may take both of these medications together - most patients  find it most helpful when alternating between the two (i.e. Ibuprofen at 6am,  tylenol at 9am, ibuprofen at 12pm ...) d. A  prescription for pain medication should be given to you upon discharge.  Take your pain medication as prescribed if your pain is not adequatly controlled with the over-the-counter pain reliefs mentioned above.  4. Avoid getting constipated.  Between the surgery and the pain medications, it is common to experience some constipation.  Increasing fluid  intake and taking a fiber supplement (such as Metamucil, Citrucel, FiberCon, MiraLax, etc) 1-2 times a day regularly will usually help prevent this problem from occurring.  A mild laxative (prune juice, Milk of Magnesia, MiraLax, etc) should be taken according to package directions if there are no bowel movements after 48 hours.    5. Dressing: Your incisions are covered in Dermabond which is like sterile superglue for the skin. This will come off on it's own in a couple weeks. It is waterproof and you may bathe normally starting the day after your surgery in a shower. Avoid baths/pools/lakes/oceans until your wounds have fully healed.  6. ACTIVITIES as tolerated:   a. Avoid heavy lifting (>10lbs or 1 gallon of milk) for the next 6 weeks. b. You may resume regular (light) daily activities beginning the next day--such as daily self-care, walking, climbing stairs--gradually increasing activities as tolerated.  If you can walk 30 minutes without difficulty, it is safe to try more intense activity such as jogging, treadmill, bicycling, low-impact aerobics.  c. DO NOT PUSH THROUGH PAIN.  Let pain be your guide: If it hurts to do something, don't do it. d. You[address]ennis Basty drive when you are no longer taking prescription pain medication, you can comfortably wear a seatbelt, and you can safely maneuver your car and apply brakes. e. YouDennis Basty have sexual intercourse when it is comfortable.   7. FOLLOW UP in our office a. Please call [facility] at [phone] to set up an appointment  to see your surgeon in the office for a follow-up appointment approximately 2 weeks after your surgery. b. Make sure that you call for this appointment the day you arrive home to insure a convenient appointment time.  9. If you have disability or family leave forms that need to be completed, you may have them completed by your primary care physician's office; for return to work instructions, please ask our office staff and they will be happy to  assist you in obtaining this documentation   When to call us [phone]: 1. Poor pain control 2. Reactions / problems with new medications (rash/itching, etc)  3. Fever over 101.5 F (38.5 C) 4. Inability to urinate 5. Nausea/vomiting 6. Worsening swelling or bruising 7. Continued bleeding from incision. 8. Increased pain, redness, or drainage from the incision  The clinic staff is available to answer your questions during regular business hours (8:30am-5pm).  Please dont hesitate to call and ask to speak to one of our nurses for clinical concerns.   A surgeon from [facility] Surgery is always on call at the hospitals   If you have a medical emergency, go to the nearest emergency room or call 911.  [facility], [place] [address], [place], [place], [place]  [address] MAIN: [phone] FAX: [phone] [email or web address]

## 2018-02-03 NOTE — H&P (Signed)
CC: Referred by primary care for evaluation of possible symptomatic cholelithiasis - here today for surgery  HPI: Paula Parker is a very pleasant 57 year old female with history of hypovitamin D whom presented from her primary care for evaluation of possible symptomatic cholelithiasis. She described a multiyear history of intermittent right upper quadrant crampy abdominal discomfort that typically lasts less than 30 minutes. She stated these attacks occur every 1-2 months. They can reliably be brought on by eating greasy and fatty foods. The pain does not radiate. Avoiding fatty foods has help decrease the occurrence of these. She denies any history of fever/chills related to this pain. She denies any history of yellowing of her skin or eyes, pale stool, or coke colored urine. Her laboratory evaluations have always been normal.  She also notes history of intermittent crampy abdominal discomfort/gas related pains.  She underwent CT abdomen/pelvis 10/2017 which demonstrated gallstones. Incidentally noted tiny fat-containing umbilical hernia LFTs and lipase were normal at that time. She did have a mild lipase elevation in the past at 55 but has been normal on more recent checks  PMH: Low vitamin D well controlled with cholecalciferol  PSH: Left foot bunion removal; abnormal Pap smear in 1980s for which she had laser treatment of her cervix and she denies any prior abdominal operations  FHx: Her sister had ovarian cancer  Social: She is a reformed smoker-quit 3 years ago and reformed alcohol user also quit 3 years ago. Denies illicit drug use. 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  Past Medical History:  Diagnosis Date  . Alcoholic fatty liver   . GERD (gastroesophageal reflux disease)   . GLAUCOMA, BORDERLINE   . Hiatal hernia   .  HYPERLIPIDEMIA   . HYPERTENSION   . PALPITATIONS, OCCASIONAL   . Pseudomembranous colitis   . SMOKER     Past Surgical History:  Procedure Laterality Date  . CERVIX LESION DESTRUCTION  1980'S  . OVARY SURGERY     removed precancerous cells with a laser    Family History  Problem Relation Age of Onset  . Ovarian cancer Sister 46  . Heart disease Sister   . Early death Sister   . ALS Mother   . Coronary artery disease Other        female first degree <50  . Diabetes Maternal Grandfather   . Stroke Neg Hx   . Kidney disease Neg Hx   . Hypertension Neg Hx   . Hyperlipidemia Neg Hx   . Osteoporosis Neg Hx     Social:  reports that she quit smoking about 3 years ago. Her smoking use included cigarettes. She smoked 1.00 pack per day. She has never used smokeless tobacco. She reports that she does not drink alcohol or use drugs.  Allergies:  Allergies  Allergen Reactions  . Doxycycline Other (See Comments)    Throat swelling  . Fish Allergy     Pt ate flounder and calves swelled  . Benzonatate Rash and Other (See Comments)  . Penicillins Rash    Has patient had a PCN reaction causing immediate rash, facial/tongue/throat swelling, SOB or lightheadedness with hypotension: Yes Has patient had a PCN reaction causing severe rash involving mucus membranes or skin necrosis: No Has patient had a PCN reaction that required hospitalization: No Has patient had a PCN reaction occurring within the last 10 years: Yes If all of  the above answers are "NO", then may proceed with Cephalosporin use.    . Sulfonamide Derivatives Rash and Other (See Comments)    unknown    Medications: I have reviewed the patient's current medications.  No results found for this or any previous visit (from the past 48 hour(s)).  No results found.  ROS - all of the below systems have been reviewed with the patient and positives are indicated with bold text General: chills, fever or night sweats Eyes:  blurry vision or double vision ENT: epistaxis or sore throat Allergy/Immunology: itchy/watery eyes or nasal congestion Hematologic/Lymphatic: bleeding problems, blood clots or swollen lymph nodes Endocrine: temperature intolerance or unexpected weight changes Breast: new or changing breast lumps or nipple discharge Resp: cough, shortness of breath, or wheezing CV: chest pain or dyspnea on exertion GI: as per HPI GU: dysuria, trouble voiding, or hematuria MSK: joint pain or joint stiffness Neuro: TIA or stroke symptoms Derm: pruritus and skin lesion changes Psych: anxiety and depression  PE Blood pressure 125/82, pulse 69, temperature 97.9 F (36.6 C), temperature source Oral, resp. rate 20, height 5' (1.524 m), weight 75.2 kg, SpO2 97 %. Constitutional: NAD; conversant; no deformities Eyes: Moist conjunctiva; no lid lag; anicteric; PERRL Neck: Trachea midline; no thyromegaly Lungs: Normal respiratory effort; no tactile fremitus CV: RRR; no palpable thrills; no pitting edema GI: Abd soft, NTND; no palpable hepatosplenomegaly MSK: Normal gait; no clubbing/cyanosis Psychiatric: Appropriate affect; alert and oriented x3 Lymphatic: No palpable cervical or axillary lymphadenopathy   A/P: Paula Parker is a very pleasant 29yoF with hx of low Vit D here with symptomatic cholelithasis and incidentally noted fat contraining umbilical hernia on CT  -The anatomy and physiology of the hepatobiliary system was discussed at length with the patient again today. The pathophysiology of gallbladder disease was discussed at length as well. -The options for treatment were discussed including ongoing observation which may result in subsequent gallbladder complications (infection, pancreatitis, choledocholithiasis, etc). I recommended cholecystectomy to address the pathology with potentially primary repair of small fat containing umbilical hernia. -The planned procedure, material risks (including, but not  limited to, pain, bleeding, infection, scarring, need for blood transfusion, damage to surrounding structures- blood vessels/nerves/viscus/organs, damage to bile duct, bile leak, need for additional procedures, hernia and hernia recurrence, pancreatitis, pneumonia, heart attack, stroke, death) benefits and alternatives to surgery were discussed at length. I noted a good probability that the procedure would help improve their symptoms. The patient's questions were answered to her satisfaction, she voiced understanding and she elected to proceed with surgery. Additionally, we discussed typical postoperative expectations and the recovery process.  Sharon Mt. Dema Severin, M.D. [facility]

## 2018-02-03 NOTE — Op Note (Signed)
02/03/2018 10:54 AM  PATIENT: Paula Parker  57 y.o. female  Patient Care Team: Janith Lima, MD as PCP - General (Internal Medicine) Janith Lima, MD  PRE-OPERATIVE DIAGNOSIS: Symptomatic cholelithiasis  POST-OPERATIVE DIAGNOSIS: Same  PROCEDURE: Laparoscopic cholecystectomy  SURGEON: Sharon Mt. Angad Nabers, MD  ASSISTANT: OR staff   ANESTHESIA: General endotracheal  EBL: Total I/O In: -  Out: 10 [Blood:10]  DRAINS: None  SPECIMEN: Gallbladder  COUNTS: Sponge, needle and instrument counts were reported correct x2 at the conclusion of the operation  DISPOSITION: PACU in satisfactory condition  COMPLICATIONS: None  FINDINGS: Relatively normal appearing gallbladder with some omental adhesions. Critical view of safety was obtained prior to ligating or dividing any structures. No significant umbilical hernia noted per se - Hasson placed at umbilicus at umbilical stalk which was empty.  INDICATION: [name] is a very pleasant 57 year old female with history of hypovitamin D whom presented from her primary care for evaluation of possible symptomatic cholelithiasis. She described a multiyear history of intermittent right upper quadrant crampy abdominal discomfort that typically lasts less than 30 minutes. She stated these attacks occur every 1-2 months. They can reliably be brought on by eating greasy and fatty foods. The pain does not radiate. Avoiding fatty foods has help decrease the occurrence of these. She denies any history of fever/chills related to this pain. She denies any history of yellowing of her skin or eyes, pale stool, or coke colored urine. Her laboratory evaluations have always been normal.  She also notes history of intermittent crampy abdominal discomfort/gas related pains.  She underwent CT abdomen/pelvis 10/2017 which demonstrated gallstones. Incidentally noted tiny fat-containing umbilical hernia LFTs and lipase were normal at that time. She  did have a mild lipase elevation in the past at 55 but has been normal on more recent checks  She was diagnosed with symptomatic cholelithiasis and options were discussed. She opted to pursue surgery to address her gallbladder. Please refer to H&P for details regarding this discussion.  DESCRIPTION:   The patient was identified & brought into the operating room. The patient was positioned supine on the OR table. SCDs were in place and active during the entire case. The patient underwent general endotracheal anesthesia. Pressure points were then padded and verified. The abdomen was prepped and draped in the standard sterile fashion and antibiotics were administered. A surgical timeout was performed and confirmed our plan.   A supraumbilical incision was made. The umbilical stalk was grasped and retracted outwardly. The supraumbilical fascia was identified and incised - incorporating what was likely a tiny umbilical hernia which was empty. The peritoneal cavity was gently entered bluntly. A purse-string 0 Vicryl suture was placed. The Hasson cannula was inserted into the peritoneal cavity and insufflation with CO2 commenced to 21mHg. A laparoscope was inserted into the peritoneal cavity and inspection confirmed no evidence of trocar site complications. The patient was then positioned in reverse Trendelenburg with the left side down. 3 additional 582mtrocars were placed along the right subcostal line - one 46m76mort in mid subcostal region, another 46mm90mrt in the right flank near the anterior axillary line, and a third 46mm 28mt in the left subxiphoid region obliquely near the falciform ligament.  The liver and gallbladder were inspected. The gallbladder fundus had some omental attachments which were taken down sharply. The fundus was grasped and elevated cephalad. An additional grasper was then placed on the infundibulum of the gallbladder and the infundibulum was retracted laterally. Gentle blunt dissection  was then employed with a IT consultant working down into [employer]. The peritoneum on both sides of the gallbladder was opened with hook cautery. The cystic duct was identified, carefully circumferentially dissected. The cystic artery was then carefully circumferentially dissected. The space between the cystic artery and hepatocystic plate was developed such that the liver could be seen through a window medial to the cystic artery. The triangle of Calot was cleared of all fibrofatty tissue. At this point, a critical view of safety was achieved and the only structures visualized was the skeletonized cystic duct laterally, the skeletonized cystic artery and the liver through the window medial to the artery. There was a 2 mm vessel high on the cystic artery emanating from Calot's node and traveling towards the cystic duct - infundibulum junction as well.  The cystic duct and artery were clipped with 2 clips on the "stay" side and 1 clip on the specimen side. Additional clips were placed on the 2 mm vessel crossing towards the cystic duct off Calot's node. The cystic duct and artery were then divided. The gallbladder was then freed from its remaining attachments to the liver using electrocautery and placed into an endocatch bag. There was no spillage during taking the gallbladder off the liver. Hemostasis was achieved and then re-verified. The rest of the abdomen was inspected no injury nor bleeding elsewhere was identified. The RUQ was irrigated with normal saline. The clips and gallbladder fossa were reinspected and noted to be in place and hemostatic.  The gallbladder and endocatch bag was then removed from the umbilical port site and passed off as specimen. The RUQ ports were removed under direct visualization and noted to be hemostatic. The umbilical fascia was then closed using 0 Vicryl suture. The skin of all incision sites was approximated with 4-0 monocryl subcuticular suture and dermabond  applied. The patient was then awakened from anesthesia, extubated and transferred to a stretcher for transport to PACU in satisfactory condition.

## 2018-02-03 NOTE — Anesthesia Postprocedure Evaluation (Signed)
Anesthesia Post Note  Patient: Paula Parker  Procedure(s) Performed: LAPAROSCOPIC CHOLECYSTECTOMY (N/A Abdomen)     Patient location during evaluation: PACU Anesthesia Type: General Level of consciousness: awake and alert, oriented and patient cooperative Pain management: pain level controlled Vital Signs Assessment: post-procedure vital signs reviewed and stable Respiratory status: spontaneous breathing, nonlabored ventilation and respiratory function stable Cardiovascular status: blood pressure returned to baseline and stable Postop Assessment: no apparent nausea or vomiting Anesthetic complications: no    Last Vitals:  Vitals:   02/03/18 1233 02/03/18 1256  BP: (!) 102/58 (!) 89/55  Pulse: (!) 59 65  Resp: 14 16  Temp: (!) 36.4 C 36.6 C  SpO2: 98% 97%    Last Pain:  Vitals:   02/03/18 1256  TempSrc:   PainSc: 3                  Griselda Bramblett,E. Jahkeem Kurka

## 2018-02-03 NOTE — Anesthesia Procedure Notes (Signed)
Procedure Name: Intubation Date/Time: 02/03/2018 9:37 AM Performed by: Jonna Munro, CRNA Pre-anesthesia Checklist: Patient identified, Emergency Drugs available, Suction available, Patient being monitored and Timeout performed Patient Re-evaluated:Patient Re-evaluated prior to induction Oxygen Delivery Method: Circle system utilized Preoxygenation: Pre-oxygenation with 100% oxygen Induction Type: IV induction Ventilation: Mask ventilation without difficulty Laryngoscope Size: Glidescope and 3 Grade View: Grade I Tube type: Oral Tube size: 7.0 mm Number of attempts: 1 Airway Equipment and Method: Stylet and Video-laryngoscopy Placement Confirmation: ETT inserted through vocal cords under direct vision,  positive ETCO2 and breath sounds checked- equal and bilateral Secured at: 22 cm Tube secured with: Tape Dental Injury: Teeth and Oropharynx as per pre-operative assessment

## 2018-02-03 NOTE — Transfer of Care (Signed)
Immediate Anesthesia Transfer of Care Note  Patient: Paula Parker  Procedure(s) Performed: LAPAROSCOPIC CHOLECYSTECTOMY (N/A Abdomen)  Patient Location: PACU  Anesthesia Type:General  Level of Consciousness: awake, alert  and oriented  Airway & Oxygen Therapy: Patient Spontanous Breathing and Patient connected to face mask oxygen  Post-op Assessment: Report given to RN and Post -op Vital signs reviewed and stable  Post vital signs: Reviewed and stable  Last Vitals:  Vitals Value Taken Time  BP    Temp    Pulse 58 02/03/2018 10:56 AM  Resp 7 02/03/2018 10:56 AM  SpO2 100 % 02/03/2018 10:56 AM  Vitals shown include unvalidated device data.  Last Pain:  Vitals:   02/03/18 0903  TempSrc:   PainSc: 0-No pain      Patients Stated Pain Goal: 4 (54/65/68 1275)  Complications: No apparent anesthesia complications

## 2018-02-03 NOTE — Anesthesia Preprocedure Evaluation (Signed)
Anesthesia Evaluation  Patient identified by MRN, date of birth, ID band Patient awake    Reviewed: Allergy & Precautions, NPO status , Patient's Chart, lab work & pertinent test results  History of Anesthesia Complications Negative for: history of anesthetic complications  Airway Mallampati: III  TM Distance: >3 FB Neck ROM: Full    Dental  (+) Dental Advisory Given   Pulmonary former smoker,    breath sounds clear to auscultation       Cardiovascular hypertension, Pt. on medications (-) angina Rhythm:Regular Rate:Normal     Neuro/Psych Mild glaucoma    GI/Hepatic Neg liver ROS, GERD  Poorly Controlled,  Endo/Other  obese  Renal/GU negative Renal ROS     Musculoskeletal  (+) Arthritis ,   Abdominal (+) + obese,   Peds  Hematology negative hematology ROS (+)   Anesthesia Other Findings   Reproductive/Obstetrics                             Anesthesia Physical Anesthesia Plan  ASA: II  Anesthesia Plan: General   Post-op Pain Management:    Induction: Intravenous  PONV Risk Score and Plan: 4 or greater and Dexamethasone, Ondansetron and Treatment may vary due to age or medical condition  Airway Management Planned: Oral ETT and Video Laryngoscope Planned  Additional Equipment:   Intra-op Plan:   Post-operative Plan: Extubation in OR  Informed Consent: I have reviewed the patients History and Physical, chart, labs and discussed the procedure including the risks, benefits and alternatives for the proposed anesthesia with the patient or authorized representative who has indicated his/her understanding and acceptance.   Dental advisory given  Plan Discussed with: CRNA and Surgeon  Anesthesia Plan Comments: (Plan routine monitors, VideoGlide GETA)        Anesthesia Quick Evaluation

## 2018-02-04 ENCOUNTER — Encounter (HOSPITAL_COMMUNITY): Payer: Self-pay | Admitting: Surgery

## 2018-02-06 NOTE — Addendum Note (Signed)
Addendum  created 02/06/18 0738 by Lollie Sails, CRNA   Charge Capture section accepted

## 2018-02-27 ENCOUNTER — Ambulatory Visit: Payer: Self-pay

## 2018-02-27 NOTE — Telephone Encounter (Signed)
Pt called with C/O dizziness when changing positions. Pt states that she has never has symptoms like this in the past.  She has had recent Cholecystectomy surgery Nov.1st.  She states that the symptoms started just after that and have been ongoing for 2-3 weeks. She is not dizzy until she bends over or changes positions. She states the room spins and she has to hold on till the sensation passes. Pt states since her surgery she will have loose BM's and has had to watch her diet closely. Pt states today her BM was normal but yesterday she had 3 loose stools. She also has some sinus congestion. She has not been drinking a lot of fluids. Pt was encouraged to drink adequate fluids as per care advice instructions. Appointment scheduled per protocol. Care advice read to patient. Pt verbalized understanding of all instructions. Reason for Disposition . [1] MODERATE dizziness (e.g., interferes with normal activities) AND [2] has NOT been evaluated by physician for this  (Exception: dizziness caused by heat exposure, sudden standing, or poor fluid intake)  Answer Assessment - Initial Assessment Questions 1. DESCRIPTION: "Describe your dizziness."     passout sensation 2. LIGHTHEADED: "Do you feel lightheaded?" (e.g., somewhat faint, woozy, weak upon standing)     Fiant if stands too fast 3. VERTIGO: "Do you feel like either you or the room is spinning or tilting?" (i.e. vertigo)     spinning 4. SEVERITY: "How bad is it?"  "Do you feel like you are going to faint?" "Can you stand and walk?"   - MILD - walking normally   - MODERATE - interferes with normal activities (e.g., work, school)    - SEVERE - unable to stand, requires support to walk, feels like passing out now.      moderate 5. ONSET:  "When did the dizziness begin?"     1-2 weeks after surgery 6. AGGRAVATING FACTORS: "Does anything make it worse?" (e.g., standing, change in head position)     Changing positions expecially bending 7. HEART RATE:  "Can you tell me your heart rate?" "How many beats in 15 seconds?"  (Note: not all patients can do this)       no 8. CAUSE: "What do you think is causing the dizziness?"     dehydrated 9. RECURRENT SYMPTOM: "Have you had dizziness before?" If so, ask: "When was the last time?" "What happened that time?"     no 10. OTHER SYMPTOMS: "Do you have any other symptoms?" (e.g., fever, chest pain, vomiting, diarrhea, bleeding)       Diarrhea at times just had gullbladder removed. 11. PREGNANCY: "Is there any chance you are pregnant?" "When was your last menstrual period?"       no  Protocols used: DIZZINESS [facility]

## 2018-02-28 ENCOUNTER — Encounter: Payer: Self-pay | Admitting: Internal Medicine

## 2018-02-28 ENCOUNTER — Other Ambulatory Visit: Payer: Self-pay

## 2018-02-28 ENCOUNTER — Other Ambulatory Visit (INDEPENDENT_AMBULATORY_CARE_PROVIDER_SITE_OTHER): Payer: 59

## 2018-02-28 ENCOUNTER — Ambulatory Visit (INDEPENDENT_AMBULATORY_CARE_PROVIDER_SITE_OTHER): Payer: 59 | Admitting: Internal Medicine

## 2018-02-28 VITALS — BP 118/78 | HR 84 | Temp 98.1°F | Resp 16 | Ht 60.0 in | Wt 164.0 lb

## 2018-02-28 DIAGNOSIS — I10 Essential (primary) hypertension: Secondary | ICD-10-CM

## 2018-02-28 LAB — CBC WITH DIFFERENTIAL/PLATELET
BASOS ABS: 0.1 10*3/uL (ref 0.0–0.1)
Basophils Relative: 0.9 % (ref 0.0–3.0)
EOS PCT: 3 % (ref 0.0–5.0)
Eosinophils Absolute: 0.2 10*3/uL (ref 0.0–0.7)
HCT: 41.1 % (ref 36.0–46.0)
HEMOGLOBIN: 13.6 g/dL (ref 12.0–15.0)
LYMPHS ABS: 2.6 10*3/uL (ref 0.7–4.0)
Lymphocytes Relative: 38.3 % (ref 12.0–46.0)
MCHC: 33 g/dL (ref 30.0–36.0)
MCV: 96 fl (ref 78.0–100.0)
MONO ABS: 0.7 10*3/uL (ref 0.1–1.0)
Monocytes Relative: 10.8 % (ref 3.0–12.0)
NEUTROS PCT: 47 % (ref 43.0–77.0)
Neutro Abs: 3.2 10*3/uL (ref 1.4–7.7)
Platelets: 284 10*3/uL (ref 150.0–400.0)
RBC: 4.28 Mil/uL (ref 3.87–5.11)
RDW: 13.1 % (ref 11.5–15.5)
WBC: 6.9 10*3/uL (ref 4.0–10.5)

## 2018-02-28 LAB — BASIC METABOLIC PANEL
BUN: 23 mg/dL (ref 6–23)
CO2: 33 meq/L — AB (ref 19–32)
Calcium: 9.9 mg/dL (ref 8.4–10.5)
Chloride: 102 mEq/L (ref 96–112)
Creatinine, Ser: 0.99 mg/dL (ref 0.40–1.20)
GFR: 61.57 mL/min (ref >60.00)
GLUCOSE: 93 mg/dL (ref 70–99)
POTASSIUM: 3.9 meq/L (ref 3.5–5.1)
SODIUM: 141 meq/L (ref 135–145)

## 2018-02-28 MED ORDER — AZILSARTAN MEDOXOMIL 40 MG PO TABS: 1.0000 | ORAL_TABLET | Freq: Every day | ORAL | 1 refills | Status: DC

## 2018-02-28 NOTE — Progress Notes (Signed)
Subjective:  Patient ID: Paula Parker, female    DOB: Dec 09, 1960  Age: 57 y.o. MRN: [id number]  CC: Hypertension   HPI Paula Parker presents for f/up - She complains of worsening orthostatic dizziness and lightheadedness.  Outpatient Medications Prior to Visit  Medication Sig Dispense Refill  . Cholecalciferol 50000 units capsule Take 1 capsule (50,000 Units total) by mouth once a week. (Patient taking differently: Take 50,000 Units by mouth every Monday. ) 12 capsule 1  . Azilsartan-Chlorthalidone (EDARBYCLOR) 40-12.5 MG TABS Take 1 tablet by mouth daily. 90 tablet 1   No facility-administered medications prior to visit.     ROS Review of Systems  Constitutional: Negative.  Negative for diaphoresis, fatigue and unexpected weight change.  HENT: Negative.   Eyes: Negative for visual disturbance.  Respiratory: Negative for cough, chest tightness, shortness of breath and wheezing.   Cardiovascular: Negative for chest pain, palpitations and leg swelling.  Gastrointestinal: Negative for abdominal pain, diarrhea, nausea and vomiting.  Genitourinary: Negative.  Negative for difficulty urinating.  Musculoskeletal: Negative.   Skin: Negative.  Negative for pallor.  Neurological: Positive for dizziness and light-headedness. Negative for syncope, weakness and headaches.  Hematological: Negative for adenopathy. Does not bruise/bleed easily.  Psychiatric/Behavioral: Negative.     Objective:  BP 118/78 (BP Location: Left Arm, Patient Position: Sitting, Cuff Size: Normal)   Pulse 84   Temp 98.1 F (36.7 C) (Oral)   Resp 16   Ht 5' (1.524 m)   Wt 164 lb (74.4 kg)   SpO2 97%   BMI 32.03 kg/m   BP Readings from Last 3 Encounters:  02/28/18 118/78  02/03/18 (!) 89/55  01/26/18 124/77    Wt Readings from Last 3 Encounters:  02/28/18 164 lb (74.4 kg)  02/03/18 165 lb 12.8 oz (75.2 kg)  01/26/18 165 lb 12.8 oz (75.2 kg)    Physical Exam  Constitutional: She is oriented  to person, place, and time. No distress.  HENT:  Mouth/Throat: Oropharynx is clear and moist. No oropharyngeal exudate.  Eyes: Conjunctivae are normal. No scleral icterus.  Neck: Normal range of motion. Neck supple. No JVD present. No thyromegaly present.  Cardiovascular: Normal rate, regular rhythm and normal heart sounds. Exam reveals no gallop.  No murmur heard. Pulmonary/Chest: Effort normal and breath sounds normal. No respiratory distress. She has no wheezes. She has no rales.  Abdominal: Soft. Bowel sounds are normal. She exhibits no mass. There is no hepatosplenomegaly. There is no tenderness.  Musculoskeletal: Normal range of motion. She exhibits no edema, tenderness or deformity.  Lymphadenopathy:    She has no cervical adenopathy.  Neurological: She is alert and oriented to person, place, and time.  Skin: Skin is warm and dry. She is not diaphoretic. No pallor.  Vitals reviewed.   Lab Results  Component Value Date   WBC 6.9 02/28/2018   HGB 13.6 02/28/2018   HCT 41.1 02/28/2018   PLT 284.0 02/28/2018   GLUCOSE 93 02/28/2018   CHOL 172 10/11/2017   TRIG 74.0 10/11/2017   HDL 76.80 10/11/2017   LDLCALC 81 10/11/2017   ALT 21 10/11/2017   AST 23 10/11/2017   NA 141 02/28/2018   K 3.9 02/28/2018   CL 102 02/28/2018   CREATININE 0.99 02/28/2018   BUN 23 02/28/2018   CO2 33 (H) 02/28/2018   TSH 4.00 06/19/2015    No results found.  Assessment & Plan:   Adely was seen today for hypertension.  Diagnoses and all orders for  this visit:  Essential hypertension, benign- Her blood pressure is over controlled and she is symptomatic.  Her electrolytes and renal function are normal.  I have asked her to stop taking the thiazide diuretic and will control her blood pressure with an ARB. -     Azilsartan Medoxomil (EDARBI) 40 MG TABS; Take 1 tablet by mouth daily. -     Basic metabolic panel; Future -     CBC with Differential/Platelet; Future   I have discontinued Connye Burkitt. Boileau's Azilsartan-Chlorthalidone. I am also having her start on Azilsartan Medoxomil. Additionally, I am having her maintain her Cholecalciferol.  Meds ordered this encounter  Medications  . Azilsartan Medoxomil (EDARBI) 40 MG TABS    Sig: Take 1 tablet by mouth daily.    Dispense:  90 tablet    Refill:  1     Follow-up: Return in about 2 months (around 04/30/2018).  Scarlette Calico, MD

## 2018-02-28 NOTE — Patient Instructions (Signed)

## 2018-03-01 ENCOUNTER — Encounter: Payer: Self-pay | Admitting: Internal Medicine

## 2018-04-10 ENCOUNTER — Other Ambulatory Visit: Payer: Self-pay | Admitting: Internal Medicine

## 2018-04-10 DIAGNOSIS — E559 Vitamin D deficiency, unspecified: Secondary | ICD-10-CM

## 2018-04-12 MED FILL — VIT D3-50 50,000 UNITS CAPS: 1.25 MG | 84 days supply | Qty: 12 | Fill #0

## 2018-04-27 ENCOUNTER — Ambulatory Visit (INDEPENDENT_AMBULATORY_CARE_PROVIDER_SITE_OTHER): Payer: No Typology Code available for payment source | Admitting: Internal Medicine

## 2018-04-27 ENCOUNTER — Encounter: Payer: Self-pay | Admitting: Internal Medicine

## 2018-04-27 VITALS — BP 130/88 | HR 79 | Temp 98.4°F | Resp 16 | Ht 60.0 in | Wt 170.0 lb

## 2018-04-27 DIAGNOSIS — M818 Other osteoporosis without current pathological fracture: Secondary | ICD-10-CM

## 2018-04-27 DIAGNOSIS — I10 Essential (primary) hypertension: Secondary | ICD-10-CM | POA: Diagnosis not present

## 2018-04-27 DIAGNOSIS — Z1231 Encounter for screening mammogram for malignant neoplasm of breast: Secondary | ICD-10-CM | POA: Insufficient documentation

## 2018-04-27 NOTE — Progress Notes (Signed)
Subjective:  Patient ID: Paula Parker, female    DOB: May 28, 1960  Age: 58 y.o. MRN: [id number]  CC: Hypertension   HPI Mckensey Berghuis presents for a BP check - She tells me her blood pressure has been well controlled.  She feels well and offers no complaints.  Outpatient Medications Prior to Visit  Medication Sig Dispense Refill  . Azilsartan Medoxomil (EDARBI) 40 MG TABS Take 1 tablet by mouth daily. 90 tablet 1  . Cholecalciferol (D3-50) 1.25 MG (50000 UT) capsule Take 1 capsule (50,000 Units total) by mouth every Monday. 12 capsule 1   No facility-administered medications prior to visit.     ROS Review of Systems  Constitutional: Negative for diaphoresis and fatigue.  HENT: Negative.   Eyes: Negative for visual disturbance.  Respiratory: Negative for cough, chest tightness, shortness of breath and wheezing.   Cardiovascular: Negative for chest pain, palpitations and leg swelling.  Gastrointestinal: Negative for abdominal pain, constipation, diarrhea, nausea and vomiting.  Genitourinary: Negative.  Negative for difficulty urinating and dysuria.  Musculoskeletal: Negative.  Negative for arthralgias and myalgias.  Skin: Negative.  Negative for color change.  Neurological: Negative for dizziness, weakness and light-headedness.  Hematological: Negative for adenopathy. Does not bruise/bleed easily.  Psychiatric/Behavioral: Negative.     Objective:  BP 130/88 (BP Location: Left Arm, Patient Position: Sitting, Cuff Size: Normal)   Pulse 79   Temp 98.4 F (36.9 C) (Oral)   Resp 16   Ht 5' (1.524 m)   Wt 170 lb (77.1 kg)   SpO2 96%   BMI 33.20 kg/m   BP Readings from Last 3 Encounters:  04/27/18 130/88  02/28/18 118/78  02/03/18 (!) 89/55    Wt Readings from Last 3 Encounters:  04/27/18 170 lb (77.1 kg)  02/28/18 164 lb (74.4 kg)  02/03/18 165 lb 12.8 oz (75.2 kg)    Physical Exam Vitals signs reviewed.  HENT:     Nose: No congestion or rhinorrhea.   Mouth/Throat:     Mouth: Mucous membranes are moist.     Pharynx: Oropharynx is clear.  Eyes:     General: No scleral icterus.    Conjunctiva/sclera: Conjunctivae normal.  Neck:     Musculoskeletal: Normal range of motion and neck supple. No neck rigidity or muscular tenderness.  Cardiovascular:     Rate and Rhythm: Normal rate and regular rhythm.     Pulses: Normal pulses.     Heart sounds: No murmur. No gallop.   Pulmonary:     Effort: Pulmonary effort is normal.     Breath sounds: No stridor. No wheezing, rhonchi or rales.  Abdominal:     General: Abdomen is flat.     Palpations: There is no hepatomegaly, splenomegaly or mass.     Tenderness: There is no abdominal tenderness.  Musculoskeletal: Normal range of motion.        General: No swelling.     Right lower leg: No edema.     Left lower leg: No edema.  Skin:    General: Skin is warm.     Findings: No erythema.  Neurological:     General: No focal deficit present.     Mental Status: She is oriented to person, place, and time. Mental status is at baseline.     Lab Results  Component Value Date   WBC 6.9 02/28/2018   HGB 13.6 02/28/2018   HCT 41.1 02/28/2018   PLT 284.0 02/28/2018   GLUCOSE 93 02/28/2018  CHOL 172 10/11/2017   TRIG 74.0 10/11/2017   HDL 76.80 10/11/2017   LDLCALC 81 10/11/2017   ALT 21 10/11/2017   AST 23 10/11/2017   NA 141 02/28/2018   K 3.9 02/28/2018   CL 102 02/28/2018   CREATININE 0.99 02/28/2018   BUN 23 02/28/2018   CO2 33 (H) 02/28/2018   TSH 4.00 06/19/2015    No results found.  Assessment & Plan:   Ricky was seen today for hypertension.  Diagnoses and all orders for this visit:  Other osteoporosis, unspecified pathological fracture presence -     DG Bone Density; Future  Visit for screening mammogram -     MM DIGITAL SCREENING BILATERAL; Future  Essential hypertension, benign- Her blood pressure is adequately well controlled.  Will continue the ARB at the current  dose.   I am having Connye Burkitt. Laux maintain her Azilsartan Medoxomil and Cholecalciferol.  No orders of the defined types were placed in this encounter.    Follow-up: Return in about 6 months (around 10/26/2018).  Scarlette Calico, MD

## 2018-04-27 NOTE — Patient Instructions (Signed)

## 2018-04-30 ENCOUNTER — Encounter: Payer: Self-pay | Admitting: Internal Medicine

## 2018-06-07 ENCOUNTER — Encounter: Payer: Self-pay | Admitting: Internal Medicine

## 2018-06-15 ENCOUNTER — Ambulatory Visit (INDEPENDENT_AMBULATORY_CARE_PROVIDER_SITE_OTHER): Payer: No Typology Code available for payment source | Admitting: Nurse Practitioner

## 2018-06-15 ENCOUNTER — Other Ambulatory Visit (INDEPENDENT_AMBULATORY_CARE_PROVIDER_SITE_OTHER): Payer: No Typology Code available for payment source

## 2018-06-15 ENCOUNTER — Encounter: Payer: Self-pay | Admitting: Nurse Practitioner

## 2018-06-15 ENCOUNTER — Other Ambulatory Visit: Payer: Self-pay

## 2018-06-15 VITALS — BP 116/80 | HR 81 | Temp 98.4°F | Ht 60.0 in | Wt 171.0 lb

## 2018-06-15 DIAGNOSIS — R197 Diarrhea, unspecified: Secondary | ICD-10-CM

## 2018-06-15 LAB — CBC
HCT: 43.2 % (ref 36.0–46.0)
Hemoglobin: 14.3 g/dL (ref 12.0–15.0)
MCHC: 33.1 g/dL (ref 30.0–36.0)
MCV: 96.8 fl (ref 78.0–100.0)
Platelets: 251 10*3/uL (ref 150.0–400.0)
RBC: 4.46 Mil/uL (ref 3.87–5.11)
RDW: 12.9 % (ref 11.5–15.5)
WBC: 5.9 10*3/uL (ref 4.0–10.5)

## 2018-06-15 LAB — COMPREHENSIVE METABOLIC PANEL
ALBUMIN: 4.4 g/dL (ref 3.5–5.2)
ALK PHOS: 48 U/L (ref 39–117)
ALT: 20 U/L (ref 0–35)
AST: 22 U/L (ref 0–37)
BILIRUBIN TOTAL: 0.4 mg/dL (ref 0.2–1.2)
BUN: 17 mg/dL (ref 6–23)
CO2: 28 mEq/L (ref 19–32)
Calcium: 9.5 mg/dL (ref 8.4–10.5)
Chloride: 105 mEq/L (ref 96–112)
Creatinine, Ser: 0.93 mg/dL (ref 0.40–1.20)
GFR: 62.19 mL/min (ref >60.00)
GLUCOSE: 92 mg/dL (ref 70–99)
Potassium: 4.4 mEq/L (ref 3.5–5.1)
Sodium: 139 mEq/L (ref 135–145)
TOTAL PROTEIN: 6.6 g/dL (ref 6.0–8.3)

## 2018-06-15 LAB — LIPASE: Lipase: 28 U/L (ref 11.0–59.0)

## 2018-06-15 LAB — AMYLASE: Amylase: 55 U/L (ref 27–131)

## 2018-06-15 MED ORDER — DIPHENOXYLATE-ATROPINE 2.5-0.025 MG PO TABS: 1.0000 | ORAL_TABLET | Freq: Four times a day (QID) | ORAL | 0 refills | Status: DC | PRN

## 2018-06-15 MED FILL — DIPHENOXYLATE-ATROP 2.5-0.0: 2.5-0.025 | 8 days supply | Qty: 30 | Fill #0

## 2018-06-15 NOTE — Patient Instructions (Signed)
Please head downstairs for lab work  Start lomotil as discussed for diarrhea  Please call surgeon office to schedule a follow up   Diarrhea, Adult Diarrhea is when you pass loose and watery poop (stool) often. Diarrhea can make you feel weak and cause you to lose water in your body (get dehydrated). Losing water in your body can cause you to:  Feel tired and thirsty.  Have a dry mouth.  Go pee (urinate) less often. Diarrhea often lasts 2-3 days. However, it can last longer if it is a sign of something more serious. It is important to treat your diarrhea as told by your doctor. Follow these instructions at home: Eating and drinking     Follow these instructions as told by your doctor:  Take an ORS (oral rehydration solution). This is a drink that helps you replace fluids and minerals your body lost. It is sold at pharmacies and stores.  Drink plenty of fluids, such as: ? Water. ? Ice chips. ? Diluted fruit juice. ? Low-calorie sports drinks. ? Milk, if you want.  Avoid drinking fluids that have a lot of sugar or caffeine in them.  Eat bland, easy-to-digest foods in small amounts as you are able. These foods include: ? Bananas. ? Applesauce. ? Rice. ? Low-fat (lean) meats. ? Toast. ? Crackers.  Avoid alcohol.  Avoid spicy or fatty foods.  Medicines  Take over-the-counter and prescription medicines only as told by your doctor.  If you were prescribed an antibiotic medicine, take it as told by your doctor. Do not stop using the antibiotic even if you start to feel better. General instructions   Wash your hands often using soap and water. If soap and water are not available, use a hand sanitizer. Others in your home should wash their hands as well. Hands should be washed: ? After using the toilet or changing a diaper. ? Before preparing, cooking, or serving food. ? While caring for a sick person. ? While visiting someone in a hospital.  Drink enough fluid to  keep your pee (urine) pale yellow.  Rest at home while you get better.  Watch your condition for any changes.  Take a warm bath to help with any burning or pain from having diarrhea.  Keep all follow-up visits as told by your doctor. This is important. Contact a doctor if:  You have a fever.  Your diarrhea gets worse.  You have new symptoms.  You cannot keep fluids down.  You feel light-headed or dizzy.  You have a headache.  You have muscle cramps. Get help right away if:  You have chest pain.  You feel very weak or you pass out (faint).  You have bloody or black poop or poop that looks like tar.  You have very bad pain, cramping, or bloating in your belly (abdomen).  You have trouble breathing or you are breathing very quickly.  Your heart is beating very quickly.  Your skin feels cold and clammy.  You feel confused.  You have signs of losing too much water in your body, such as: ? Dark pee, very little pee, or no pee. ? Cracked lips. ? Dry mouth. ? Sunken eyes. ? Sleepiness. ? Weakness. Summary  Diarrhea is when you pass loose and watery poop (stool) often.  Diarrhea can make you feel weak and cause you to lose water in your body (get dehydrated).  Take an ORS (oral rehydration solution). This is a drink that is sold at pharmacies and  stores.  Eat bland, easy-to-digest foods in small amounts as you are able.  Contact a doctor if your condition gets worse. Get help right away if you have signs that you have lost too much water in your body. This information is not intended to replace advice given to you by your health care provider. Make sure you discuss any questions you have with your health care provider. Document Released: 09/08/2007 Document Revised: 08/26/2017 Document Reviewed: 08/26/2017 Elsevier Interactive Patient Education  2019 [employer].

## 2018-06-15 NOTE — Progress Notes (Signed)
Paula Parker is a 58 y.o. female with the following history as recorded in [employer]:  Patient Active Problem List   Diagnosis Date Noted  . Visit for screening mammogram 04/27/2018  . Calculus of gallbladder without cholecystitis without obstruction 10/17/2017  . Vitamin D deficiency disease 10/11/2017  . DJD (degenerative joint disease), ankle and foot, left 07/15/2016  . Osteoporosis 12/05/2014  . Right lumbar radiculitis 12/05/2014  . Allergic rhinitis 07/19/2013  . GERD (gastroesophageal reflux disease) 04/17/2013  . Colon cancer screening 04/17/2013  . Atrophic vaginitis 02/03/2012  . Routine general medical examination at a health care facility 01/24/2012  . Alcoholic fatty liver 88/50/2774  . GLAUCOMA, BORDERLINE 04/27/2010  . HIATAL HERNIA 04/27/2010  . HYPERLIPIDEMIA 04/16/2010  . Essential hypertension, benign 06/02/2007    Current Outpatient Medications  Medication Sig Dispense Refill  . Azilsartan Medoxomil (EDARBI) 40 MG TABS Take 1 tablet by mouth daily. 90 tablet 1  . Cholecalciferol (D3-50) 1.25 MG (50000 UT) capsule Take 1 capsule (50,000 Units total) by mouth every Monday. 12 capsule 1  . diphenoxylate-atropine (LOMOTIL) 2.5-0.025 MG tablet Take 1 tablet by mouth 4 (four) times daily as needed for diarrhea or loose stools. 30 tablet 0   No current facility-administered medications for this visit.     Allergies: Doxycycline; Fish allergy; Benzonatate; Penicillins; and Sulfonamide derivatives  Past Medical History:  Diagnosis Date  . Alcoholic fatty liver   . GERD (gastroesophageal reflux disease)   . GLAUCOMA, BORDERLINE   . Hiatal hernia   . HYPERLIPIDEMIA   . HYPERTENSION   . PALPITATIONS, OCCASIONAL   . Pseudomembranous colitis   . SMOKER     Past Surgical History:  Procedure Laterality Date  . CERVIX LESION DESTRUCTION  1980'S  . CHOLECYSTECTOMY N/A 02/03/2018   Procedure: LAPAROSCOPIC CHOLECYSTECTOMY;  Surgeon: Ileana Roup, MD;   Location: [facility];  Service: General;  Laterality: N/A;  . OVARY SURGERY     removed precancerous cells with a laser    Family History  Problem Relation Age of Onset  . Ovarian cancer Sister 69  . Heart disease Sister   . Early death Sister   . ALS Mother   . Coronary artery disease Other        female first degree <50  . Diabetes Maternal Grandfather   . Stroke Neg Hx   . Kidney disease Neg Hx   . Hypertension Neg Hx   . Hyperlipidemia Neg Hx   . Osteoporosis Neg Hx     Social History   Tobacco Use  . Smoking status: Former Smoker    Packs/day: 1.00    Types: Cigarettes    Last attempt to quit: 04/27/2014    Years since quitting: 4.1  . Smokeless tobacco: Never Used  Substance Use Topics  . Alcohol use: No    Alcohol/week: 0.0 standard drinks     Subjective:  Paula Parker is here today for evaluation of diarrhea, abd pain Her symptoms began after cholecystectomy on 02/2018 She reports since the surgery shes had loose to watery stools, daily, multiple stools per day, as well as intermittent right sided abdominal pain, especially when using the bathroom or lifting at work, works as Retail buyer, pulls heavy trash bags all day She has started skipping breakfast altogether due to not being able to make it to work after because of having to use bathroom so many times She was often eating salads or chicken for lunch, but now just soup, usually has 3-4 bowel movements  after eating soup for lunch. She denies fevers, chills, nausea, vomiting, rectal bleeding, urinary changes, vaginal discharge or bleeding.  She never had a surgical follow up, didn't know she needed to schedule one She has not tried anything at home for her symptoms, was not sure what to take. No recent travel or antibiotics  ROS- See HPI  Objective:  Vitals:   06/15/18 1112  BP: 116/80  Pulse: 81  Temp: 98.4 F (36.9 C)  TempSrc: Oral  SpO2: 96%  Weight: 171 lb (77.6 kg)  Height: 5' (1.524 m)    General: Well  developed, well nourished, in no acute distress  Skin : Warm and dry.  Head: Normocephalic and atraumatic  Eyes: Sclera and conjunctiva clear; pupils round and reactive to light; extraocular movements intact  Oropharynx: Pink, supple. No suspicious lesions  Neck: Supple Lungs: Respirations unlabored; clear to auscultation bilaterally  CVS exam: normal rate and regular rhythm, S1 and S2 normal.  Abdomen: Soft; nondistended; normoactive bowel sounds; no masses or hepatosplenomegaly; no rigidity or guarding Extremities: No edema, cyanosis, clubbing  Vessels: Symmetric bilaterally  Neurologic: Alert and oriented; speech intact; face symmetrical; moves all extremities well; CNII-XII intact without focal deficit  Psychiatric: Normal mood and affect.  Assessment:  1. Diarrhea, unspecified type     Plan:   Labs ordered for further evaluation She will call surgeon to schedule surgical follow up Will try short course of lomotil-med dosing, side effects discussed Home management, diarrhea diet, red flags and return precautions including when to seek immediate care discussed and printed on AVS F/U with further recommendations pending lab results  No follow-ups on file.  Orders Placed This Encounter  Procedures  . Gastrointestinal Pathogen Panel PCR    Standing Status:   Future    Standing Expiration Date:   06/15/2019  . CBC    Standing Status:   Future    Number of Occurrences:   1    Standing Expiration Date:   06/15/2019  . Comprehensive metabolic panel    Standing Status:   Future    Number of Occurrences:   1    Standing Expiration Date:   06/15/2019  . Lipase    Standing Status:   Future    Number of Occurrences:   1    Standing Expiration Date:   06/15/2019  . Amylase    Standing Status:   Future    Number of Occurrences:   1    Standing Expiration Date:   06/15/2019    Requested Prescriptions   Signed Prescriptions Disp Refills  . diphenoxylate-atropine (LOMOTIL) 2.5-0.025  MG tablet 30 tablet 0    Sig: Take 1 tablet by mouth 4 (four) times daily as needed for diarrhea or loose stools.

## 2018-06-16 ENCOUNTER — Encounter: Payer: Self-pay | Admitting: Nurse Practitioner

## 2018-06-19 ENCOUNTER — Other Ambulatory Visit: Payer: Self-pay | Admitting: Family

## 2018-06-20 MED FILL — VIT D3-50 50,000 UNITS CAPS: 1.25 MG | 84 days supply | Qty: 12 | Fill #1

## 2018-06-28 ENCOUNTER — Other Ambulatory Visit: Payer: Self-pay | Admitting: Surgery

## 2018-06-28 DIAGNOSIS — Z9049 Acquired absence of other specified parts of digestive tract: Secondary | ICD-10-CM

## 2018-07-03 ENCOUNTER — Encounter: Payer: Self-pay | Admitting: Internal Medicine

## 2018-07-04 ENCOUNTER — Ambulatory Visit: Payer: Self-pay

## 2018-07-04 ENCOUNTER — Other Ambulatory Visit: Payer: Self-pay

## 2018-07-18 ENCOUNTER — Ambulatory Visit: Payer: Self-pay | Admitting: Internal Medicine

## 2018-09-13 ENCOUNTER — Other Ambulatory Visit: Payer: Self-pay | Admitting: Internal Medicine

## 2018-09-13 DIAGNOSIS — I10 Essential (primary) hypertension: Secondary | ICD-10-CM

## 2018-10-02 ENCOUNTER — Other Ambulatory Visit: Payer: Self-pay | Admitting: Internal Medicine

## 2018-10-02 DIAGNOSIS — E559 Vitamin D deficiency, unspecified: Secondary | ICD-10-CM

## 2018-10-02 MED FILL — VIT D3-50 50,000 UNITS CAPS: 1.25 MG | 28 days supply | Qty: 4 | Fill #0

## 2018-10-10 ENCOUNTER — Encounter: Payer: Self-pay | Admitting: Internal Medicine

## 2018-10-10 ENCOUNTER — Ambulatory Visit (INDEPENDENT_AMBULATORY_CARE_PROVIDER_SITE_OTHER): Payer: No Typology Code available for payment source | Admitting: Internal Medicine

## 2018-10-10 ENCOUNTER — Other Ambulatory Visit: Payer: Self-pay

## 2018-10-10 ENCOUNTER — Other Ambulatory Visit (INDEPENDENT_AMBULATORY_CARE_PROVIDER_SITE_OTHER): Payer: No Typology Code available for payment source

## 2018-10-10 VITALS — BP 144/90 | HR 86 | Temp 98.0°F | Resp 16 | Ht 60.0 in | Wt 174.2 lb

## 2018-10-10 DIAGNOSIS — I10 Essential (primary) hypertension: Secondary | ICD-10-CM

## 2018-10-10 DIAGNOSIS — K580 Irritable bowel syndrome with diarrhea: Secondary | ICD-10-CM | POA: Diagnosis not present

## 2018-10-10 DIAGNOSIS — E559 Vitamin D deficiency, unspecified: Secondary | ICD-10-CM

## 2018-10-10 DIAGNOSIS — K7581 Nonalcoholic steatohepatitis (NASH): Secondary | ICD-10-CM

## 2018-10-10 DIAGNOSIS — Z1231 Encounter for screening mammogram for malignant neoplasm of breast: Secondary | ICD-10-CM

## 2018-10-10 DIAGNOSIS — K700 Alcoholic fatty liver: Secondary | ICD-10-CM

## 2018-10-10 LAB — BASIC METABOLIC PANEL
BUN: 18 mg/dL (ref 6–23)
CO2: 27 mEq/L (ref 19–32)
Calcium: 9.2 mg/dL (ref 8.4–10.5)
Chloride: 107 mEq/L (ref 96–112)
Creatinine, Ser: 0.96 mg/dL (ref 0.40–1.20)
GFR: 59.89 mL/min — ABNORMAL LOW (ref >60.00)
Glucose, Bld: 99 mg/dL (ref 70–99)
Potassium: 4.1 mEq/L (ref 3.5–5.1)
Sodium: 141 mEq/L (ref 135–145)

## 2018-10-10 LAB — HEPATIC FUNCTION PANEL
ALT: 22 U/L (ref 0–35)
AST: 23 U/L (ref 0–37)
Albumin: 4.2 g/dL (ref 3.5–5.2)
Alkaline Phosphatase: 47 U/L (ref 39–117)
Bilirubin, Direct: 0.1 mg/dL (ref 0.0–0.3)
Total Bilirubin: 0.4 mg/dL (ref 0.2–1.2)
Total Protein: 6.7 g/dL (ref 6.0–8.3)

## 2018-10-10 LAB — VITAMIN D 25 HYDROXY (VIT D DEFICIENCY, FRACTURES): VITD: 81.13 ng/mL (ref 30.00–100.00)

## 2018-10-10 LAB — TSH: TSH: 4.17 u[IU]/mL (ref 0.35–4.50)

## 2018-10-10 MED ORDER — CHOLECALCIFEROL 50 MCG (2000 UT) PO TABS: 1.0000 | ORAL_TABLET | Freq: Every day | ORAL | 1 refills | Status: AC

## 2018-10-10 NOTE — Progress Notes (Signed)
Subjective:  Patient ID: Paula Parker, female    DOB: [birth year]  Age: 58 y.o. MRN: [id number]  CC: Hypertension and Abdominal Pain   HPI Paula Parker presents for f/up - She continues to complain of intermittent episodes of right upper quadrant pain and diarrhea.  The diarrhea usually occurs after she eats a meal.  The diarrhea is not severe enough for her to take anything to control her bowel movements.  She has a history of fatty liver disease related to her prior history of alcohol intake.  She has quit drinking alcohol.  An ultrasound was ordered but was never completed.  She has not been working on her lifestyle modifications to control her blood pressure.  She complains of weight gain.  Outpatient Medications Prior to Visit  Medication Sig Dispense Refill  . diphenoxylate-atropine (LOMOTIL) 2.5-0.025 MG tablet Take 1 tablet by mouth 4 (four) times daily as needed for diarrhea or loose stools. 30 tablet 0  . EDARBI 40 MG TABS TAKE 1 TABLET EACH DAY. 90 tablet 1  . D3-50 1.25 MG (50000 UT) capsule TAKE 1 CAPSULE BY MOUTH EVERY MONDAY. 4 capsule 0   No facility-administered medications prior to visit.     ROS Review of Systems  Constitutional: Positive for fatigue and unexpected weight change (wt gain). Negative for appetite change.  HENT: Negative.   Eyes: Negative.   Respiratory: Negative for cough, chest tightness, shortness of breath and wheezing.   Cardiovascular: Negative for chest pain, palpitations and leg swelling.  Gastrointestinal: Positive for abdominal pain and diarrhea. Negative for abdominal distention, blood in stool, constipation, nausea and vomiting.  Endocrine: Negative.   Genitourinary: Negative.  Negative for decreased urine volume, difficulty urinating, dysuria, hematuria and urgency.  Musculoskeletal: Negative.  Negative for arthralgias and myalgias.  Skin: Negative.   Neurological: Negative.  Negative for dizziness, weakness and light-headedness.   Hematological: Negative for adenopathy. Does not bruise/bleed easily.  Psychiatric/Behavioral: Negative.     Objective:  BP (!) 144/90 (BP Location: Left Arm, Patient Position: Sitting, Cuff Size: Normal)   Pulse 86   Temp 98 F (36.7 C) (Oral)   Resp 16   Ht 5' (1.524 m)   Wt 174 lb 4 oz (79 kg)   SpO2 96%   BMI 34.03 kg/m   BP Readings from Last 3 Encounters:  10/10/18 (!) 144/90  06/15/18 116/80  04/27/18 130/88    Wt Readings from Last 3 Encounters:  10/10/18 174 lb 4 oz (79 kg)  06/15/18 171 lb (77.6 kg)  04/27/18 170 lb (77.1 kg)    Physical Exam Vitals signs reviewed.  Constitutional:      General: She is not in acute distress.    Appearance: She is obese. She is not ill-appearing, toxic-appearing or diaphoretic.  HENT:     Mouth/Throat:     Mouth: Mucous membranes are moist.     Pharynx: No pharyngeal swelling.  Eyes:     General: No scleral icterus. Cardiovascular:     Rate and Rhythm: Normal rate and regular rhythm.     Heart sounds: Normal heart sounds. No murmur. No gallop.   Pulmonary:     Breath sounds: No stridor. No wheezing, rhonchi or rales.  Abdominal:     General: Abdomen is protuberant. Bowel sounds are normal. There is no distension.     Palpations: There is no hepatomegaly or splenomegaly.     Tenderness: There is no abdominal tenderness.  Musculoskeletal: Normal range of motion.  Right lower leg: No edema.     Left lower leg: No edema.  Skin:    General: Skin is warm and dry.     Coloration: Skin is not pale.  Neurological:     General: No focal deficit present.     Mental Status: She is alert.  Psychiatric:        Mood and Affect: Mood normal.        Behavior: Behavior normal.     Lab Results  Component Value Date   WBC 5.9 06/15/2018   HGB 14.3 06/15/2018   HCT 43.2 06/15/2018   PLT 251.0 06/15/2018   GLUCOSE 99 10/10/2018   CHOL 172 10/11/2017   TRIG 74.0 10/11/2017   HDL 76.80 10/11/2017   LDLCALC 81 10/11/2017    ALT 22 10/10/2018   AST 23 10/10/2018   NA 141 10/10/2018   K 4.1 10/10/2018   CL 107 10/10/2018   CREATININE 0.96 10/10/2018   BUN 18 10/10/2018   CO2 27 10/10/2018   TSH 4.17 10/10/2018    No results found.  Assessment & Plan:   Paula Parker was seen today for hypertension and abdominal pain.  Diagnoses and all orders for this visit:  Irritable bowel syndrome with diarrhea- I think this is the most likely explanation for her constellation of symptoms.  She tells me the symptoms are not bad enough to warrant medical therapy.  Vitamin D deficiency disease- Her vitamin D level is in the upper normal range.  I have asked her to lower her vitamin D dose to 2000 IUs once a day. -     VITAMIN D 25 Hydroxy (Vit-D Deficiency, Fractures); Future -     Cholecalciferol 50 MCG (2000 UT) TABS; Take 1 tablet (2,000 Units total) by mouth daily.  Visit for screening mammogram -     MM DIGITAL SCREENING BILATERAL; Future  Alcoholic fatty liver- Her LFTs are normal now but she still has right upper quadrant discomfort.  She agrees to undergo the right upper quadrant ultrasound. -     Hepatic function panel; Future -     US Abdomen Limited RUQ; Future  Essential hypertension, benign- Her blood pressure is not adequately well controlled.  She is not willing to add another antihypertensive agent.  She agrees to improve her lifestyle modifications to try to get better control of her blood pressure. -     Basic metabolic panel; Future -     TSH; Future -     VITAMIN D 25 Hydroxy (Vit-D Deficiency, Fractures); Future   I have discontinued Paula Parker. Paula Parker's D3-50. I am also having her start on Cholecalciferol. Additionally, I am having her maintain her diphenoxylate-atropine and Edarbi.  Meds ordered this encounter  Medications  . Cholecalciferol 50 MCG (2000 UT) TABS    Sig: Take 1 tablet (2,000 Units total) by mouth daily.    Dispense:  90 tablet    Refill:  1     Follow-up: Return in about 4  months (around 02/10/2019).  Paula Calico, MD

## 2018-10-10 NOTE — Patient Instructions (Signed)

## 2018-10-12 ENCOUNTER — Encounter (HOSPITAL_COMMUNITY): Payer: Self-pay | Admitting: Emergency Medicine

## 2018-10-12 ENCOUNTER — Emergency Department (HOSPITAL_COMMUNITY): Payer: PRIVATE HEALTH INSURANCE

## 2018-10-12 ENCOUNTER — Emergency Department (HOSPITAL_COMMUNITY)
Admission: EM | Admit: 2018-10-12 | Discharge: 2018-10-12 | Disposition: A | Discharge status: Home / Self Care | Payer: PRIVATE HEALTH INSURANCE | Attending: Emergency Medicine | Admitting: Emergency Medicine

## 2018-10-12 ENCOUNTER — Other Ambulatory Visit: Payer: Self-pay

## 2018-10-12 DIAGNOSIS — Z87891 Personal history of nicotine dependence: Secondary | ICD-10-CM | POA: Insufficient documentation

## 2018-10-12 DIAGNOSIS — S60022A Contusion of left index finger without damage to nail, initial encounter: Secondary | ICD-10-CM | POA: Diagnosis not present

## 2018-10-12 DIAGNOSIS — Z79899 Other long term (current) drug therapy: Secondary | ICD-10-CM | POA: Insufficient documentation

## 2018-10-12 DIAGNOSIS — Y92238 Other place in hospital as the place of occurrence of the external cause: Secondary | ICD-10-CM | POA: Diagnosis not present

## 2018-10-12 DIAGNOSIS — I10 Essential (primary) hypertension: Secondary | ICD-10-CM | POA: Insufficient documentation

## 2018-10-12 DIAGNOSIS — Y990 Civilian activity done for income or pay: Secondary | ICD-10-CM | POA: Diagnosis not present

## 2018-10-12 DIAGNOSIS — S6992XA Unspecified injury of left wrist, hand and finger(s), initial encounter: Secondary | ICD-10-CM | POA: Diagnosis present

## 2018-10-12 DIAGNOSIS — X503XXA Overexertion from repetitive movements, initial encounter: Secondary | ICD-10-CM | POA: Insufficient documentation

## 2018-10-12 DIAGNOSIS — Y93E5 Activity, floor mopping and cleaning: Secondary | ICD-10-CM | POA: Insufficient documentation

## 2018-10-12 DIAGNOSIS — S6991XA Unspecified injury of right wrist, hand and finger(s), initial encounter: Secondary | ICD-10-CM

## 2018-10-12 MED ORDER — NAPROXEN 500 MG PO TABS: 500.0000 mg | ORAL_TABLET | Freq: Two times a day (BID) | ORAL | 0 refills | Status: AC

## 2018-10-12 NOTE — ED Provider Notes (Signed)
[facility] EMERGENCY DEPARTMENT Provider Note   CSN: [id number] Arrival date & time: 10/12/18  1608    History   Chief Complaint Chief Complaint  Patient presents with  . Finger Injury    HPI Paula Parker is a 59 y.o. female.     58 y.o female with a PMH of GERD, Glaucoma presents to the ED with a chief complaint of right index finger pain. Patient is currently employed as housekeeping at [employer], she reports she was cleaning on-call rooms, attempted to press the buttons on the door prior to entering the room and felt a popping sensation to the base of her second meta carpal.  She then reported swelling above the joint line of the base of the second digit.  She now reports bruising along the side.  She has not taken any medication for relieving symptoms, she does currently work as a is keeping.  Denies any fevers, lacerations, other injuries.  The history is provided by the patient.    Past Medical History:  Diagnosis Date  . Alcoholic fatty liver   . GERD (gastroesophageal reflux disease)   . GLAUCOMA, BORDERLINE   . Hiatal hernia   . HYPERLIPIDEMIA   . HYPERTENSION   . PALPITATIONS, OCCASIONAL   . Pseudomembranous colitis   . SMOKER     Patient Active Problem List   Diagnosis Date Noted  . Irritable bowel syndrome with diarrhea 10/10/2018  . Visit for screening mammogram 04/27/2018  . Vitamin D deficiency disease 10/11/2017  . DJD (degenerative joint disease), ankle and foot, left 07/15/2016  . Osteoporosis 12/05/2014  . Right lumbar radiculitis 12/05/2014  . Allergic rhinitis 07/19/2013  . GERD (gastroesophageal reflux disease) 04/17/2013  . Colon cancer screening 04/17/2013  . Atrophic vaginitis 02/03/2012  . Routine general medical examination at a health care facility 01/24/2012  . Alcoholic fatty liver 73/53/2992  . GLAUCOMA, BORDERLINE 04/27/2010  . HIATAL HERNIA 04/27/2010  . HYPERLIPIDEMIA 04/16/2010  . Essential hypertension,  benign 06/02/2007    Past Surgical History:  Procedure Laterality Date  . CERVIX LESION DESTRUCTION  1980'S  . CHOLECYSTECTOMY N/A 02/03/2018   Procedure: LAPAROSCOPIC CHOLECYSTECTOMY;  Surgeon: Ileana Roup, MD;  Location: [facility];  Service: General;  Laterality: N/A;  . OVARY SURGERY     removed precancerous cells with a laser     OB History    Gravida  3   Para  1   Term  1   Preterm  0   AB  2   Living  1     SAB  2   TAB  0   Ectopic  0   Multiple      Live Births  1            Home Medications    Prior to Admission medications   Medication Sig Start Date End Date Taking? Authorizing Provider  EDARBI 40 MG TABS TAKE 1 TABLET EACH DAY. Patient taking differently: Take 40 mg by mouth daily.  09/13/18  Yes Janith Lima, MD  Cholecalciferol 50 MCG (2000 UT) TABS Take 1 tablet (2,000 Units total) by mouth daily. 10/10/18   Janith Lima, MD  naproxen (NAPROSYN) 500 MG tablet Take 1 tablet (500 mg total) by mouth 2 (two) times daily for 7 days. 10/12/18 10/19/18  Janeece Fitting, PA-C    Family History Family History  Problem Relation Age of Onset  . Ovarian cancer Sister 2  . Heart disease Sister   .  Early death Sister   . ALS Mother   . Coronary artery disease Other        female first degree <50  . Diabetes Maternal Grandfather   . Stroke Neg Hx   . Kidney disease Neg Hx   . Hypertension Neg Hx   . Hyperlipidemia Neg Hx   . Osteoporosis Neg Hx     Social History Social History   Tobacco Use  . Smoking status: Former Smoker    Packs/day: 1.00    Types: Cigarettes    Quit date: 04/27/2014    Years since quitting: 4.4  . Smokeless tobacco: Never Used  Substance Use Topics  . Alcohol use: No    Alcohol/week: 0.0 standard drinks  . Drug use: No     Allergies   Doxycycline, Fish allergy, Benzonatate, Penicillins, and Sulfonamide derivatives   Review of Systems Review of Systems  Constitutional: Negative for fever.   Musculoskeletal: Positive for arthralgias and myalgias.     Physical Exam Updated Vital Signs BP (!) 136/94 (BP Location: Right Arm)   Pulse 94   Temp 98.3 F (36.8 C) (Oral)   Resp 16   SpO2 95%   Physical Exam Vitals signs and nursing note reviewed.  Constitutional:      General: She is not in acute distress.    Appearance: She is well-developed.  HENT:     Head: Normocephalic and atraumatic.     Mouth/Throat:     Pharynx: No oropharyngeal exudate.  Eyes:     Pupils: Pupils are equal, round, and reactive to light.  Neck:     Musculoskeletal: Normal range of motion.  Cardiovascular:     Rate and Rhythm: Regular rhythm.     Heart sounds: Normal heart sounds.  Pulmonary:     Effort: Pulmonary effort is normal. No respiratory distress.     Breath sounds: Normal breath sounds.  Abdominal:     General: Bowel sounds are normal. There is no distension.     Palpations: Abdomen is soft.     Tenderness: There is no abdominal tenderness.  Musculoskeletal:        General: No deformity.     Right hand: She exhibits tenderness and swelling. Normal sensation noted. Normal strength noted.       Hands:     Right lower leg: No edema.     Left lower leg: No edema.     Comments: Pulses present, swelling noted to the base of the second metacarpal, full flexion on exam.  Capillary refill.  Bruising, erythema noted to the area.  Skin:    General: Skin is warm and dry.  Neurological:     Mental Status: She is alert and oriented to person, place, and time.      ED Treatments / Results  Labs (all labs ordered are listed, but only abnormal results are displayed) Labs Reviewed - No data to display  EKG None  Radiology Dg Finger Index Right  Result Date: 10/12/2018 CLINICAL DATA:  Acute onset of a stinging pain in the RIGHT index finger that began yesterday, now associated with erythema and swelling. No injury. EXAM: RIGHT INDEX FINGER 2+V COMPARISON:  None. FINDINGS: Diffuse soft  tissue swelling. No evidence of acute fracture or dislocation. Joint spaces well preserved. Well-preserved bone mineral density. No intrinsic osseous abnormalities. IMPRESSION: No osseous abnormality. Electronically Signed   By: Evangeline Dakin M.D.   On: 10/12/2018 17:36    Procedures Procedures (including critical care time)  Medications  Ordered in ED Medications - No data to display   Initial Impression / Assessment and Plan / ED Course  I have reviewed the triage vital signs and the nursing notes.  Pertinent labs & imaging results that were available during my care of the patient were reviewed by me and considered in my medical decision making (see chart for details).    Patient with an extensive past medical history presents to the ED status post right index finger injury.  During primary evaluation finger appears swollen, some redness noted to the meta carpal base of the second digit.  Patient does have full flexion.  No streaking noted, low suspicion for any cellulitis. X-ray was obtained to rule out any fracture, dislocation.   X-ray of the right index finger showed: Diffuse soft tissue swelling. No evidence of acute fracture or  dislocation. Joint spaces well preserved. Well-preserved bone  mineral density. No intrinsic osseous abnormalities.   Patient likely suffering from a strain, placed her on a splint, will also have her follow-up with orthopedics as needed.  Patient understands and agrees with management.  Also sent home with a short course of anti-inflammatories.  Return precautions discussed at length.   Portions of this note were generated with [employer]. Dictation errors may occur despite best attempts at proofreading.    Final Clinical Impressions(s) / ED Diagnoses   Final diagnoses:  Injury of finger of right hand, initial encounter    ED Discharge Orders         Ordered    naproxen (NAPROSYN) 500 MG tablet  2 times daily     10/12/18 1751            Janeece Fitting, PA-C 10/12/18 1813    Maudie Flakes, MD 10/16/18 [phone]

## 2018-10-12 NOTE — ED Triage Notes (Signed)
Pt states she was pressing a button repeatedly with her right pointer finger yesterday and felt a pop, has bruising and swelling to the finger.

## 2018-10-12 NOTE — Discharge Instructions (Addendum)
Your xray's today were normal. I have prescribe medication to help with your inflammation.  Please take 1 tablet twice a day for the next 7 days.  You may apply ice to your finger.  If you experience any fever, worsening symptoms please return to the emergency department.

## 2018-10-12 NOTE — ED Notes (Signed)
Pt to xray

## 2018-10-13 MED FILL — NAPROXEN 500 MG TABLET: 500 | 7 days supply | Qty: 14 | Fill #0

## 2018-10-24 ENCOUNTER — Other Ambulatory Visit: Payer: No Typology Code available for payment source

## 2018-11-02 ENCOUNTER — Encounter: Payer: Self-pay | Admitting: Internal Medicine

## 2018-11-02 ENCOUNTER — Other Ambulatory Visit: Payer: Self-pay | Admitting: Internal Medicine

## 2018-11-02 ENCOUNTER — Ambulatory Visit
Admission: RE | Admit: 2018-11-02 | Discharge: 2018-11-02 | Disposition: A | Discharge status: Home / Self Care | Payer: No Typology Code available for payment source | Source: Ambulatory Visit | Attending: Internal Medicine | Admitting: Internal Medicine

## 2018-11-02 DIAGNOSIS — K7581 Nonalcoholic steatohepatitis (NASH): Secondary | ICD-10-CM | POA: Insufficient documentation

## 2018-11-02 DIAGNOSIS — K700 Alcoholic fatty liver: Secondary | ICD-10-CM

## 2018-11-02 MED ORDER — PIOGLITAZONE HCL 15 MG PO TABS: 15.0000 mg | ORAL_TABLET | Freq: Every day | ORAL | 1 refills | Status: DC

## 2018-11-02 MED FILL — PIOGLITAZONE HCL 15 MG TAB: 15 | 90 days supply | Qty: 90 | Fill #0

## 2019-01-09 ENCOUNTER — Telehealth: Payer: Self-pay

## 2019-01-09 NOTE — Telephone Encounter (Signed)
Key: [id number]  Pt is no longer on this medication.

## 2019-02-13 ENCOUNTER — Ambulatory Visit (INDEPENDENT_AMBULATORY_CARE_PROVIDER_SITE_OTHER): Payer: No Typology Code available for payment source | Admitting: Internal Medicine

## 2019-02-13 ENCOUNTER — Other Ambulatory Visit (INDEPENDENT_AMBULATORY_CARE_PROVIDER_SITE_OTHER): Payer: No Typology Code available for payment source

## 2019-02-13 ENCOUNTER — Encounter: Payer: Self-pay | Admitting: Internal Medicine

## 2019-02-13 ENCOUNTER — Other Ambulatory Visit: Payer: Self-pay

## 2019-02-13 VITALS — BP 134/84 | HR 74 | Temp 98.4°F | Resp 16 | Ht 60.0 in | Wt 167.0 lb

## 2019-02-13 DIAGNOSIS — E559 Vitamin D deficiency, unspecified: Secondary | ICD-10-CM | POA: Diagnosis not present

## 2019-02-13 DIAGNOSIS — Z0000 Encounter for general adult medical examination without abnormal findings: Secondary | ICD-10-CM | POA: Diagnosis not present

## 2019-02-13 DIAGNOSIS — I10 Essential (primary) hypertension: Secondary | ICD-10-CM

## 2019-02-13 DIAGNOSIS — K7581 Nonalcoholic steatohepatitis (NASH): Secondary | ICD-10-CM

## 2019-02-13 LAB — URINALYSIS, ROUTINE W REFLEX MICROSCOPIC
Bilirubin Urine: NEGATIVE
Ketones, ur: NEGATIVE
Leukocytes,Ua: NEGATIVE
Nitrite: NEGATIVE
Specific Gravity, Urine: >=1.03 — AB (ref 1.000–1.030)
Total Protein, Urine: NEGATIVE
Urine Glucose: NEGATIVE
Urobilinogen, UA: 0.2 (ref 0.0–1.0)
pH: 5.5 (ref 5.0–8.0)

## 2019-02-13 LAB — CBC WITH DIFFERENTIAL/PLATELET
Basophils Absolute: 0 10*3/uL (ref 0.0–0.1)
Basophils Relative: 0.8 % (ref 0.0–3.0)
Eosinophils Absolute: 0.3 10*3/uL (ref 0.0–0.7)
Eosinophils Relative: 5.4 % — ABNORMAL HIGH (ref 0.0–5.0)
HCT: 42.3 % (ref 36.0–46.0)
Hemoglobin: 13.8 g/dL (ref 12.0–15.0)
Lymphocytes Relative: 40.6 % (ref 12.0–46.0)
Lymphs Abs: 1.9 10*3/uL (ref 0.7–4.0)
MCHC: 32.7 g/dL (ref 30.0–36.0)
MCV: 96.4 fl (ref 78.0–100.0)
Monocytes Absolute: 0.5 10*3/uL (ref 0.1–1.0)
Monocytes Relative: 10.7 % (ref 3.0–12.0)
Neutro Abs: 2 10*3/uL (ref 1.4–7.7)
Neutrophils Relative %: 42.5 % — ABNORMAL LOW (ref 43.0–77.0)
Platelets: 268 10*3/uL (ref 150.0–400.0)
RBC: 4.39 Mil/uL (ref 3.87–5.11)
RDW: 13 % (ref 11.5–15.5)
WBC: 4.8 10*3/uL (ref 4.0–10.5)

## 2019-02-13 LAB — BASIC METABOLIC PANEL
BUN: 21 mg/dL (ref 6–23)
CO2: 27 mEq/L (ref 19–32)
Calcium: 8.9 mg/dL (ref 8.4–10.5)
Chloride: 107 mEq/L (ref 96–112)
Creatinine, Ser: 0.88 mg/dL (ref 0.40–1.20)
GFR: 66.13 mL/min (ref >60.00)
Glucose, Bld: 104 mg/dL — ABNORMAL HIGH (ref 70–99)
Potassium: 4.2 mEq/L (ref 3.5–5.1)
Sodium: 141 mEq/L (ref 135–145)

## 2019-02-13 LAB — HEPATIC FUNCTION PANEL
ALT: 18 U/L (ref 0–35)
AST: 17 U/L (ref 0–37)
Albumin: 4.2 g/dL (ref 3.5–5.2)
Alkaline Phosphatase: 47 U/L (ref 39–117)
Bilirubin, Direct: 0.1 mg/dL (ref 0.0–0.3)
Total Bilirubin: 0.3 mg/dL (ref 0.2–1.2)
Total Protein: 6.5 g/dL (ref 6.0–8.3)

## 2019-02-13 LAB — LIPID PANEL
Cholesterol: 179 mg/dL (ref 0–200)
HDL: 62.1 mg/dL (ref >39.00)
LDL Cholesterol: 106 mg/dL — ABNORMAL HIGH (ref 0–99)
NonHDL: 116.5
Total CHOL/HDL Ratio: 3
Triglycerides: 55 mg/dL (ref 0.0–149.0)
VLDL: 11 mg/dL (ref 0.0–40.0)

## 2019-02-13 LAB — TSH: TSH: 3.6 u[IU]/mL (ref 0.35–4.50)

## 2019-02-13 LAB — VITAMIN D 25 HYDROXY (VIT D DEFICIENCY, FRACTURES): VITD: 67.62 ng/mL (ref 30.00–100.00)

## 2019-02-13 NOTE — Progress Notes (Signed)
Subjective:  Patient ID: Paula Parker, female    DOB: 1960-10-16  Age: 58 y.o. MRN: [id number]  CC: Annual Exam and Hypertension   HPI [name] presents for a CPX.  She tells me her blood pressure has been well controlled.  She denies any recent episodes of headache, blurred vision, chest pain, shortness of breath, dizziness, or lightheadedness.  Outpatient Medications Prior to Visit  Medication Sig Dispense Refill  . Cholecalciferol 50 MCG (2000 UT) TABS Take 1 tablet (2,000 Units total) by mouth daily. 90 tablet 1  . EDARBI 40 MG TABS TAKE 1 TABLET EACH DAY. (Patient taking differently: Take 40 mg by mouth daily. ) 90 tablet 1  . pioglitazone (ACTOS) 15 MG tablet Take 1 tablet (15 mg total) by mouth daily. (Patient not taking: Reported on 02/13/2019) 90 tablet 1   No facility-administered medications prior to visit.     ROS Review of Systems  Constitutional: Negative for diaphoresis and fatigue.  HENT: Negative.   Eyes: Negative for visual disturbance.  Respiratory: Negative for cough, chest tightness, shortness of breath and wheezing.   Cardiovascular: Negative for chest pain, palpitations and leg swelling.  Gastrointestinal: Negative for abdominal pain, constipation, diarrhea, nausea and vomiting.  Endocrine: Negative.   Genitourinary: Negative.  Negative for difficulty urinating.  Musculoskeletal: Negative.  Negative for arthralgias and myalgias.  Skin: Negative.  Negative for color change and pallor.  Neurological: Negative.  Negative for dizziness, weakness, light-headedness and headaches.  Hematological: Negative for adenopathy. Does not bruise/bleed easily.  Psychiatric/Behavioral: Negative.     Objective:  BP 134/84 (BP Location: Left Arm, Patient Position: Sitting, Cuff Size: Large)   Pulse 74   Temp 98.4 F (36.9 C) (Oral)   Resp 16   Ht 5' (1.524 m)   Wt 167 lb (75.8 kg)   SpO2 98%   BMI 32.61 kg/m   BP Readings from Last 3 Encounters:   02/13/19 134/84  10/12/18 (!) 136/94  10/10/18 (!) 144/90    Wt Readings from Last 3 Encounters:  02/13/19 167 lb (75.8 kg)  10/10/18 174 lb 4 oz (79 kg)  06/15/18 171 lb (77.6 kg)    Physical Exam Vitals signs reviewed.  Constitutional:      Appearance: Normal appearance.  HENT:     Nose: Nose normal.     Mouth/Throat:     Mouth: Mucous membranes are moist.  Eyes:     General: No scleral icterus.    Conjunctiva/sclera: Conjunctivae normal.  Neck:     Musculoskeletal: Neck supple. No muscular tenderness.  Cardiovascular:     Rate and Rhythm: Normal rate and regular rhythm.     Heart sounds: No murmur.  Pulmonary:     Effort: Pulmonary effort is normal.     Breath sounds: No stridor. No wheezing, rhonchi or rales.  Abdominal:     General: Abdomen is flat. Bowel sounds are normal. There is no distension.     Palpations: There is no hepatomegaly or splenomegaly.     Tenderness: There is no abdominal tenderness.  Musculoskeletal: Normal range of motion.     Right lower leg: No edema.     Left lower leg: No edema.  Lymphadenopathy:     Cervical: No cervical adenopathy.  Skin:    General: Skin is warm and dry.  Neurological:     General: No focal deficit present.     Mental Status: She is alert.  Psychiatric:        Mood  and Affect: Mood normal.        Behavior: Behavior normal.     Lab Results  Component Value Date   WBC 4.8 02/13/2019   HGB 13.8 02/13/2019   HCT 42.3 02/13/2019   PLT 268.0 02/13/2019   GLUCOSE 104 (H) 02/13/2019   CHOL 179 02/13/2019   TRIG 55.0 02/13/2019   HDL 62.10 02/13/2019   LDLCALC 106 (H) 02/13/2019   ALT 18 02/13/2019   AST 17 02/13/2019   NA 141 02/13/2019   K 4.2 02/13/2019   CL 107 02/13/2019   CREATININE 0.88 02/13/2019   BUN 21 02/13/2019   CO2 27 02/13/2019   TSH 3.60 02/13/2019    US Abdomen Limited Ruq  Result Date: 11/02/2018 CLINICAL DATA:  Chronic RIGHT upper quadrant pain for 9 months. History of  cholecystectomy. EXAM: ULTRASOUND ABDOMEN LIMITED RIGHT UPPER QUADRANT COMPARISON:  None. FINDINGS: Gallbladder: Status post cholecystectomy. Common bile duct: Diameter: 6 mm Liver: Diffusely echogenic suggesting fatty infiltration. No focal mass or lesion is identified within the liver. Portal vein is patent on color Doppler imaging with normal direction of blood flow towards the liver. Other: None. IMPRESSION: 1. Fatty infiltration of the liver. 2. No acute findings. 3. Status post cholecystectomy. Electronically Signed   By: Franki Cabot M.D.   On: 11/02/2018 15:01    Assessment & Plan:   Nandini was seen today for annual exam and hypertension.  Diagnoses and all orders for this visit:  Nonalcoholic steatohepatitis (NASH)- Her liver enzymes are normal now.  She was praised for her success with lifestyle modifications. -     Hepatic function panel; Future  Essential hypertension, benign- Her blood pressure is adequately well controlled.  Electrolytes and renal function are normal.  Will continue the ARB at the current dose. -     CBC with Differential; Future -     Basic metabolic panel; Future -     TSH; Future -     Urinalysis, Routine w reflex microscopic; Future  Routine general medical examination at a health care facility- Exam completed, labs reviewed, vaccines reviewed, Pap/mammogram/colon cancer screening are all up-to-date, patient education was given. -     Lipid panel; Future -     HIV antibody (Reflex); Future  Vitamin D deficiency disease- Her vitamin D level is in the normal range.  Will continue the current vitamin D supplement. -     Vitamin D 25 hydroxy; Future   I am having Connye Burkitt. Hyser maintain her Edarbi, Cholecalciferol, and pioglitazone.  No orders of the defined types were placed in this encounter.    Follow-up: Return in about 6 months (around 08/13/2019).  Scarlette Calico, MD

## 2019-02-13 NOTE — Patient Instructions (Signed)
Health Maintenance, Female Adopting a healthy lifestyle and getting preventive care are important in promoting health and wellness. Ask your health care provider about:  The right schedule for you to have regular tests and exams.  Things you can do on your own to prevent diseases and keep yourself healthy. What should I know about diet, weight, and exercise? Eat a healthy diet   Eat a diet that includes plenty of vegetables, fruits, low-fat dairy products, and lean protein.  Do not eat a lot of foods that are high in solid fats, added sugars, or sodium. Maintain a healthy weight Body mass index (BMI) is used to identify weight problems. It estimates body fat based on height and weight. Your health care provider can help determine your BMI and help you achieve or maintain a healthy weight. Get regular exercise Get regular exercise. This is one of the most important things you can do for your health. Most adults should:  Exercise for at least 150 minutes each week. The exercise should increase your heart rate and make you sweat (moderate-intensity exercise).  Do strengthening exercises at least twice a week. This is in addition to the moderate-intensity exercise.  Spend less time sitting. Even light physical activity can be beneficial. Watch cholesterol and blood lipids Have your blood tested for lipids and cholesterol at 57 years of age, then have this test every 5 years. Have your cholesterol levels checked more often if:  Your lipid or cholesterol levels are high.  You are older than 57 years of age.  You are at high risk for heart disease. What should I know about cancer screening? Depending on your health history and family history, you may need to have cancer screening at various ages. This may include screening for:  Breast cancer.  Cervical cancer.  Colorectal cancer.  Skin cancer.  Lung cancer. What should I know about heart disease, diabetes, and high blood  pressure? Blood pressure and heart disease  High blood pressure causes heart disease and increases the risk of stroke. This is more likely to develop in people who have high blood pressure readings, are of African descent, or are overweight.  Have your blood pressure checked: ? Every 3-5 years if you are 18-39 years of age. ? Every year if you are 40 years old or older. Diabetes Have regular diabetes screenings. This checks your fasting blood sugar level. Have the screening done:  Once every three years after age 40 if you are at a normal weight and have a low risk for diabetes.  More often and at a younger age if you are overweight or have a high risk for diabetes. What should I know about preventing infection? Hepatitis B If you have a higher risk for hepatitis B, you should be screened for this virus. Talk with your health care provider to find out if you are at risk for hepatitis B infection. Hepatitis C Testing is recommended for:  Everyone born from 1945 through 1965.  Anyone with known risk factors for hepatitis C. Sexually transmitted infections (STIs)  Get screened for STIs, including gonorrhea and chlamydia, if: ? You are sexually active and are younger than 57 years of age. ? You are older than 57 years of age and your health care provider tells you that you are at risk for this type of infection. ? Your sexual activity has changed since you were last screened, and you are at increased risk for chlamydia or gonorrhea. Ask your health care provider if   you are at risk.  Ask your health care provider about whether you are at high risk for HIV. Your health care provider may recommend a prescription medicine to help prevent HIV infection. If you choose to take medicine to prevent HIV, you should first get tested for HIV. You should then be tested every 3 months for as long as you are taking the medicine. Pregnancy  If you are about to stop having your period (premenopausal) and  you may become pregnant, seek counseling before you get pregnant.  Take 400 to 800 micrograms (mcg) of folic acid every day if you become pregnant.  Ask for birth control (contraception) if you want to prevent pregnancy. Osteoporosis and menopause Osteoporosis is a disease in which the bones lose minerals and strength with aging. This can result in bone fractures. If you are 65 years old or older, or if you are at risk for osteoporosis and fractures, ask your health care provider if you should:  Be screened for bone loss.  Take a calcium or vitamin D supplement to lower your risk of fractures.  Be given hormone replacement therapy (HRT) to treat symptoms of menopause. Follow these instructions at home: Lifestyle  Do not use any products that contain nicotine or tobacco, such as cigarettes, e-cigarettes, and chewing tobacco. If you need help quitting, ask your health care provider.  Do not use street drugs.  Do not share needles.  Ask your health care provider for help if you need support or information about quitting drugs. Alcohol use  Do not drink alcohol if: ? Your health care provider tells you not to drink. ? You are pregnant, may be pregnant, or are planning to become pregnant.  If you drink alcohol: ? Limit how much you use to 0-1 drink a day. ? Limit intake if you are breastfeeding.  Be aware of how much alcohol is in your drink. In the [place] one drink equals one 12 oz bottle of beer (355 mL), one 5 oz glass of wine (148 mL), or one 1 oz glass of hard liquor (44 mL). General instructions  Schedule regular health, dental, and eye exams.  Stay current with your vaccines.  Tell your health care provider if: ? You often feel depressed. ? You have ever been abused or do not feel safe at home. Summary  Adopting a healthy lifestyle and getting preventive care are important in promoting health and wellness.  Follow your health care provider's instructions about healthy  diet, exercising, and getting tested or screened for diseases.  Follow your health care provider's instructions on monitoring your cholesterol and blood pressure. This information is not intended to replace advice given to you by your health care provider. Make sure you discuss any questions you have with your health care provider. Document Released: 10/05/2010 Document Revised: 03/15/2018 Document Reviewed: 03/15/2018 Elsevier Patient Education  2020 Elsevier Inc.  

## 2019-02-14 ENCOUNTER — Encounter: Payer: Self-pay | Admitting: Internal Medicine

## 2019-02-14 LAB — HIV ANTIBODY (ROUTINE TESTING W REFLEX): HIV 1&2 Ab, 4th Generation: NONREACTIVE

## 2019-03-07 ENCOUNTER — Other Ambulatory Visit: Payer: Self-pay | Admitting: Internal Medicine

## 2019-03-07 DIAGNOSIS — I10 Essential (primary) hypertension: Secondary | ICD-10-CM

## 2019-03-12 ENCOUNTER — Other Ambulatory Visit: Payer: Self-pay

## 2019-03-13 ENCOUNTER — Encounter: Payer: Self-pay | Admitting: Women's Health

## 2019-03-13 ENCOUNTER — Ambulatory Visit (INDEPENDENT_AMBULATORY_CARE_PROVIDER_SITE_OTHER): Payer: No Typology Code available for payment source | Admitting: Women's Health

## 2019-03-13 VITALS — BP 138/80 | Ht 60.0 in | Wt 167.0 lb

## 2019-03-13 DIAGNOSIS — Z01419 Encounter for gynecological examination (general) (routine) without abnormal findings: Secondary | ICD-10-CM | POA: Diagnosis not present

## 2019-03-13 DIAGNOSIS — Z13820 Encounter for screening for osteoporosis: Secondary | ICD-10-CM

## 2019-03-13 NOTE — Progress Notes (Signed)
Paula Parker 57/21/1962 [id number]    History:    Presents for annual exam.  Postmenopausal on no HRT with no bleeding.  Normal Pap and mammogram history overdue for mammogram last mammogram 2017 at [facility].  Has not had a screening colonoscopy.  02/2018 cholecystectomy.  Primary care manages hypertension, and labs.  Not sexually active, husband's health.  Past medical history, past surgical history, family history and social history were all reviewed and documented in the EPIC chart.  Works in housekeeping at [employer].  Smoking and alcohol free for 6 years.  1 daughter and 3 grandchildren.  ROS:  A ROS was performed and pertinent positives and negatives are included.  Exam:  Vitals:   57/08/20 0828  BP: 57/80  Weight: 167 lb (75.8 kg)  Height: 5' (1.524 m)   Body mass index is 32.61 kg/m.   General appearance:  Normal Thyroid:  Symmetrical, normal in size, without palpable masses or nodularity. Respiratory  Auscultation:  Clear without wheezing or rhonchi Cardiovascular  Auscultation:  Regular rate, without rubs, murmurs or gallops  Edema/varicosities:  Not grossly evident Abdominal  Soft,nontender, without masses, guarding or rebound.  Liver/spleen:  No organomegaly noted  Hernia:  None appreciated  Skin  Inspection:  Grossly normal   Breasts: Examined lying and sitting.     Right: Without masses, retractions, discharge or axillary adenopathy.     Left: Without masses, retractions, discharge or axillary adenopathy. Gentitourinary   Inguinal/mons:  Normal without inguinal adenopathy  External genitalia:  Normal  BUS/Urethra/Skene's glands:  Normal  Vagina:  Normal  Cervix:  Normal  Uterus:  normal in size, shape and contour.  Midline and mobile  Adnexa/parametria:     Rt: Without masses or tenderness.   Lt: Without masses or tenderness.  Anus and perineum: Normal  Digital rectal exam: Normal sphincter tone without palpated masses or  tenderness  Assessment/Plan:  58 y.o. MWF G3, P1 for annual exam with no complaints.  Postmenopausal/no HRT/no bleeding Hypertension-primary care manages labs and meds Overdue for screening colonoscopy and mammogram Obesity  Plan: Reviewed importance of screenings, [facility] information given instructed to schedule.  SBEs, annual screening mammogram,'s [facility] phone number given instructed to schedule.  Increase regular cardio type and balance type exercise reviewed and encouraged.  Calcium rich foods, vitamin D 2000 daily and decrease calories/carbs encouraged.  Schedule DEXA.  Pap normal 2018, new screening guidelines reviewed.      [place], 1:56 PM 03/13/2019

## 2019-03-13 NOTE — Patient Instructions (Addendum)
Fish oil supp daily [facility]  Dr Carlean Purl  [phone] Teola Bradley [phone] Vit D 2000 iu daily dexa A&D ointment Health Maintenance for Postmenopausal Women Menopause is a normal process in which your ability to get pregnant comes to an end. This process happens slowly over many months or years, usually between the ages of 80 and 58. Menopause is complete when you have missed your menstrual periods for 12 months. It is important to talk with your health care provider about some of the most common conditions that affect women after menopause (postmenopausal women). These include heart disease, cancer, and bone loss (osteoporosis). Adopting a healthy lifestyle and getting preventive care can help to promote your health and wellness. The actions you take can also lower your chances of developing some of these common conditions. What should I know about menopause? During menopause, you may get a number of symptoms, such as:  Hot flashes. These can be moderate or severe.  Night sweats.  Decrease in sex drive.  Mood swings.  Headaches.  Tiredness.  Irritability.  Memory problems.  Insomnia. Choosing to treat or not to treat these symptoms is a decision that you make with your health care provider. Do I need hormone replacement therapy?  Hormone replacement therapy is effective in treating symptoms that are caused by menopause, such as hot flashes and night sweats.  Hormone replacement carries certain risks, especially as you become older. If you are thinking about using estrogen or estrogen with progestin, discuss the benefits and risks with your health care provider. What is my risk for heart disease and stroke? The risk of heart disease, heart attack, and stroke increases as you age. One of the causes may be a change in the body's hormones during menopause. This can affect how your body uses dietary fats, triglycerides, and cholesterol. Heart attack and stroke are medical emergencies. There  are many things that you can do to help prevent heart disease and stroke. Watch your blood pressure  High blood pressure causes heart disease and increases the risk of stroke. This is more likely to develop in people who have high blood pressure readings, are of African descent, or are overweight.  Have your blood pressure checked: ? Every 3-5 years if you are 74-69 years of age. ? Every year if you are 92 years old or older. Eat a healthy diet   Eat a diet that includes plenty of vegetables, fruits, low-fat dairy products, and lean protein.  Do not eat a lot of foods that are high in solid fats, added sugars, or sodium. Get regular exercise Get regular exercise. This is one of the most important things you can do for your health. Most adults should:  Try to exercise for at least 150 minutes each week. The exercise should increase your heart rate and make you sweat (moderate-intensity exercise).  Try to do strengthening exercises at least twice each week. Do these in addition to the moderate-intensity exercise.  Spend less time sitting. Even light physical activity can be beneficial. Other tips  Work with your health care provider to achieve or maintain a healthy weight.  Do not use any products that contain nicotine or tobacco, such as cigarettes, e-cigarettes, and chewing tobacco. If you need help quitting, ask your health care provider.  Know your numbers. Ask your health care provider to check your cholesterol and your blood sugar (glucose). Continue to have your blood tested as directed by your health care provider. Do I need screening for cancer? Depending  on your health history and family history, you may need to have cancer screening at different stages of your life. This may include screening for:  Breast cancer.  Cervical cancer.  Lung cancer.  Colorectal cancer. What is my risk for osteoporosis? After menopause, you may be at increased risk for osteoporosis.  Osteoporosis is a condition in which bone destruction happens more quickly than new bone creation. To help prevent osteoporosis or the bone fractures that can happen because of osteoporosis, you may take the following actions:  If you are 70-69 years old, get at least 1,000 mg of calcium and at least 600 mg of vitamin D per day.  If you are older than age 28 but younger than age 25, get at least 1,200 mg of calcium and at least 600 mg of vitamin D per day.  If you are older than age 50, get at least 1,200 mg of calcium and at least 800 mg of vitamin D per day. Smoking and drinking excessive alcohol increase the risk of osteoporosis. Eat foods that are rich in calcium and vitamin D, and do weight-bearing exercises several times each week as directed by your health care provider. How does menopause affect my mental health? Depression may occur at any age, but it is more common as you become older. Common symptoms of depression include:  Low or sad mood.  Changes in sleep patterns.  Changes in appetite or eating patterns.  Feeling an overall lack of motivation or enjoyment of activities that you previously enjoyed.  Frequent crying spells. Talk with your health care provider if you think that you are experiencing depression. General instructions See your health care provider for regular wellness exams and vaccines. This may include:  Scheduling regular health, dental, and eye exams.  Getting and maintaining your vaccines. These include: ? Influenza vaccine. Get this vaccine each year before the flu season begins. ? Pneumonia vaccine. ? Shingles vaccine. ? Tetanus, diphtheria, and pertussis (Tdap) booster vaccine. Your health care provider may also recommend other immunizations. Tell your health care provider if you have ever been abused or do not feel safe at home. Summary  Menopause is a normal process in which your ability to get pregnant comes to an end.  This condition causes  hot flashes, night sweats, decreased interest in sex, mood swings, headaches, or lack of sleep.  Treatment for this condition may include hormone replacement therapy.  Take actions to keep yourself healthy, including exercising regularly, eating a healthy diet, watching your weight, and checking your blood pressure and blood sugar levels.  Get screened for cancer and depression. Make sure that you are up to date with all your vaccines. This information is not intended to replace advice given to you by your health care provider. Make sure you discuss any questions you have with your health care provider. Document Released: 05/14/2005 Document Revised: 03/15/2018 Document Reviewed: 03/15/2018 Elsevier Patient Education  2020 [employer].

## 2019-03-14 LAB — URINALYSIS, COMPLETE W/RFL CULTURE
Bacteria, UA: NONE SEEN /HPF
Bilirubin Urine: NEGATIVE
Glucose, UA: NEGATIVE
Hgb urine dipstick: NEGATIVE
Hyaline Cast: NONE SEEN /LPF
Ketones, ur: NEGATIVE
Leukocyte Esterase: NEGATIVE
Nitrites, Initial: NEGATIVE
Protein, ur: NEGATIVE
RBC / HPF: NONE SEEN /HPF (ref 0–2)
Specific Gravity, Urine: 1.006 (ref 1.001–1.03)
Squamous Epithelial / HPF: NONE SEEN /HPF (ref <=5)
WBC, UA: NONE SEEN /HPF (ref 0–5)
pH: <=5 (ref 5.0–8.0)

## 2019-03-14 LAB — NO CULTURE INDICATED

## 2019-03-15 ENCOUNTER — Encounter: Payer: Self-pay | Admitting: Internal Medicine

## 2019-03-19 ENCOUNTER — Encounter: Payer: Self-pay | Admitting: Internal Medicine

## 2019-03-20 DIAGNOSIS — Z0279 Encounter for issue of other medical certificate: Secondary | ICD-10-CM

## 2019-03-20 NOTE — Telephone Encounter (Signed)
Forms have been signed, Faxed, sent to scan &Charged for.  Original has been mailed to patient and mychart message sent to inform.

## 2019-03-21 ENCOUNTER — Encounter: Payer: Self-pay | Admitting: Internal Medicine

## 2019-04-18 ENCOUNTER — Encounter: Payer: Self-pay | Admitting: Internal Medicine

## 2019-04-18 ENCOUNTER — Other Ambulatory Visit: Payer: Self-pay

## 2019-04-18 DIAGNOSIS — K580 Irritable bowel syndrome with diarrhea: Secondary | ICD-10-CM

## 2019-04-18 NOTE — Telephone Encounter (Signed)
She has seen [facility] in the past. You could go ahead and enter referral and see if they will schedule her.

## 2019-04-19 ENCOUNTER — Other Ambulatory Visit: Payer: Self-pay | Admitting: Obstetrics & Gynecology

## 2019-04-19 ENCOUNTER — Ambulatory Visit (INDEPENDENT_AMBULATORY_CARE_PROVIDER_SITE_OTHER): Payer: No Typology Code available for payment source

## 2019-04-19 DIAGNOSIS — M810 Age-related osteoporosis without current pathological fracture: Secondary | ICD-10-CM

## 2019-04-19 DIAGNOSIS — Z780 Asymptomatic menopausal state: Secondary | ICD-10-CM | POA: Diagnosis not present

## 2019-04-19 DIAGNOSIS — Z13820 Encounter for screening for osteoporosis: Secondary | ICD-10-CM

## 2019-05-03 ENCOUNTER — Encounter: Payer: Self-pay | Admitting: *Deleted

## 2019-05-15 NOTE — Telephone Encounter (Signed)
Error

## 2019-05-25 ENCOUNTER — Ambulatory Visit: Payer: No Typology Code available for payment source | Admitting: Internal Medicine

## 2019-07-09 ENCOUNTER — Other Ambulatory Visit: Payer: Self-pay | Admitting: Internal Medicine

## 2019-07-09 DIAGNOSIS — I10 Essential (primary) hypertension: Secondary | ICD-10-CM

## 2019-07-30 ENCOUNTER — Other Ambulatory Visit: Payer: Self-pay | Admitting: Women's Health

## 2019-07-30 MED ORDER — NYSTATIN-TRIAMCINOLONE 100000-0.1 UNIT/GM-% EX OINT: 1.0000 "application " | TOPICAL_OINTMENT | Freq: Two times a day (BID) | CUTANEOUS | 0 refills | Status: DC

## 2019-07-30 MED FILL — NYSTATIN-TRIAMCINOLONE OINT: 100000-0.1 | 7 days supply | Qty: 30 | Fill #0

## 2019-07-30 NOTE — Progress Notes (Signed)
m °

## 2019-08-09 ENCOUNTER — Other Ambulatory Visit: Payer: Self-pay

## 2019-08-09 ENCOUNTER — Encounter: Payer: Self-pay | Admitting: Internal Medicine

## 2019-08-09 ENCOUNTER — Ambulatory Visit (INDEPENDENT_AMBULATORY_CARE_PROVIDER_SITE_OTHER): Payer: No Typology Code available for payment source | Admitting: Internal Medicine

## 2019-08-09 VITALS — BP 130/74 | HR 75 | Temp 98.6°F | Resp 16 | Ht 60.0 in | Wt 172.0 lb

## 2019-08-09 DIAGNOSIS — Z23 Encounter for immunization: Secondary | ICD-10-CM

## 2019-08-09 DIAGNOSIS — K7581 Nonalcoholic steatohepatitis (NASH): Secondary | ICD-10-CM | POA: Diagnosis not present

## 2019-08-09 DIAGNOSIS — I10 Essential (primary) hypertension: Secondary | ICD-10-CM

## 2019-08-09 DIAGNOSIS — K219 Gastro-esophageal reflux disease without esophagitis: Secondary | ICD-10-CM | POA: Diagnosis not present

## 2019-08-09 DIAGNOSIS — Z1231 Encounter for screening mammogram for malignant neoplasm of breast: Secondary | ICD-10-CM

## 2019-08-09 LAB — BASIC METABOLIC PANEL
BUN: 20 mg/dL (ref 6–23)
CO2: 30 mEq/L (ref 19–32)
Calcium: 9.4 mg/dL (ref 8.4–10.5)
Chloride: 107 mEq/L (ref 96–112)
Creatinine, Ser: 0.94 mg/dL (ref 0.40–1.20)
GFR: 61.18 mL/min (ref >60.00)
Glucose, Bld: 93 mg/dL (ref 70–99)
Potassium: 4.4 mEq/L (ref 3.5–5.1)
Sodium: 141 mEq/L (ref 135–145)

## 2019-08-09 LAB — HEPATIC FUNCTION PANEL
ALT: 18 U/L (ref 0–35)
AST: 19 U/L (ref 0–37)
Albumin: 4.2 g/dL (ref 3.5–5.2)
Alkaline Phosphatase: 49 U/L (ref 39–117)
Bilirubin, Direct: 0.1 mg/dL (ref 0.0–0.3)
Total Bilirubin: 0.5 mg/dL (ref 0.2–1.2)
Total Protein: 6.3 g/dL (ref 6.0–8.3)

## 2019-08-09 MED ORDER — ESOMEPRAZOLE MAGNESIUM 40 MG PO CPDR: 40.0000 mg | DELAYED_RELEASE_CAPSULE | Freq: Every day | ORAL | 1 refills | Status: DC

## 2019-08-09 MED FILL — ESOMEPRAZOLE MAG DR 40 MG C: 40 | 90 days supply | Qty: 90 | Fill #0

## 2019-08-09 NOTE — Patient Instructions (Signed)
Gastroesophageal Reflux Disease, Adult Gastroesophageal reflux (GER) happens when acid from the stomach flows up into the tube that connects the mouth and the stomach (esophagus). Normally, food travels down the esophagus and stays in the stomach to be digested. However, when a person has GER, food and stomach acid sometimes move back up into the esophagus. If this becomes a more serious problem, the person may be diagnosed with a disease called gastroesophageal reflux disease (GERD). GERD occurs when the reflux:  Happens often.  Causes frequent or severe symptoms.  Causes problems such as damage to the esophagus. When stomach acid comes in contact with the esophagus, the acid may cause soreness (inflammation) in the esophagus. Over time, GERD may create small holes (ulcers) in the lining of the esophagus. What are the causes? This condition is caused by a problem with the muscle between the esophagus and the stomach (lower esophageal sphincter, or LES). Normally, the LES muscle closes after food passes through the esophagus to the stomach. When the LES is weakened or abnormal, it does not close properly, and that allows food and stomach acid to go back up into the esophagus. The LES can be weakened by certain dietary substances, medicines, and medical conditions, including:  Tobacco use.  Pregnancy.  Having a hiatal hernia.  Alcohol use.  Certain foods and beverages, such as coffee, chocolate, onions, and peppermint. What increases the risk? You are more likely to develop this condition if you:  Have an increased body weight.  Have a connective tissue disorder.  Use NSAID medicines. What are the signs or symptoms? Symptoms of this condition include:  Heartburn.  Difficult or painful swallowing.  The feeling of having a lump in the throat.  Abitter taste in the mouth.  Bad breath.  Having a large amount of saliva.  Having an upset or bloated  stomach.  Belching.  Chest pain. Different conditions can cause chest pain. Make sure you see your health care provider if you experience chest pain.  Shortness of breath or wheezing.  Ongoing (chronic) cough or a night-time cough.  Wearing away of tooth enamel.  Weight loss. How is this diagnosed? Your health care provider will take a medical history and perform a physical exam. To determine if you have mild or severe GERD, your health care provider may also monitor how you respond to treatment. You may also have tests, including:  A test to examine your stomach and esophagus with a small camera (endoscopy).  A test thatmeasures the acidity level in your esophagus.  A test thatmeasures how much pressure is on your esophagus.  A barium swallow or modified barium swallow test to show the shape, size, and functioning of your esophagus. How is this treated? The goal of treatment is to help relieve your symptoms and to prevent complications. Treatment for this condition may vary depending on how severe your symptoms are. Your health care provider may recommend:  Changes to your diet.  Medicine.  Surgery. Follow these instructions at home: Eating and drinking   Follow a diet as recommended by your health care provider. This may involve avoiding foods and drinks such as: ? Coffee and tea (with or without caffeine). ? Drinks that containalcohol. ? Energy drinks and sports drinks. ? Carbonated drinks or sodas. ? Chocolate and cocoa. ? Peppermint and mint flavorings. ? Garlic and onions. ? Horseradish. ? Spicy and acidic foods, including peppers, chili powder, curry powder, vinegar, hot sauces, and barbecue sauce. ? Citrus fruit juices and citrus   fruits, such as oranges, lemons, and limes. ? Tomato-based foods, such as red sauce, chili, salsa, and pizza with red sauce. ? Fried and fatty foods, such as donuts, french fries, potato chips, and high-fat dressings. ? High-fat  meats, such as hot dogs and fatty cuts of red and white meats, such as rib eye steak, sausage, ham, and bacon. ? High-fat dairy items, such as whole milk, butter, and cream cheese.  Eat small, frequent meals instead of large meals.  Avoid drinking large amounts of liquid with your meals.  Avoid eating meals during the 2-3 hours before bedtime.  Avoid lying down right after you eat.  Do not exercise right after you eat. Lifestyle   Do not use any products that contain nicotine or tobacco, such as cigarettes, e-cigarettes, and chewing tobacco. If you need help quitting, ask your health care provider.  Try to reduce your stress by using methods such as yoga or meditation. If you need help reducing stress, ask your health care provider.  If you are overweight, reduce your weight to an amount that is healthy for you. Ask your health care provider for guidance about a safe weight loss goal. General instructions  Pay attention to any changes in your symptoms.  Take over-the-counter and prescription medicines only as told by your health care provider. Do not take aspirin, ibuprofen, or other NSAIDs unless your health care provider told you to do so.  Wear loose-fitting clothing. Do not wear anything tight around your waist that causes pressure on your abdomen.  Raise (elevate) the head of your bed about 6 inches (15 cm).  Avoid bending over if this makes your symptoms worse.  Keep all follow-up visits as told by your health care provider. This is important. Contact a health care provider if:  You have: ? New symptoms. ? Unexplained weight loss. ? Difficulty swallowing or it hurts to swallow. ? Wheezing or a persistent cough. ? A hoarse voice.  Your symptoms do not improve with treatment. Get help right away if you:  Have pain in your arms, neck, jaw, teeth, or back.  Feel sweaty, dizzy, or light-headed.  Have chest pain or shortness of breath.  Vomit and your vomit looks  like blood or coffee grounds.  Faint.  Have stool that is bloody or black.  Cannot swallow, drink, or eat. Summary  Gastroesophageal reflux happens when acid from the stomach flows up into the esophagus. GERD is a disease in which the reflux happens often, causes frequent or severe symptoms, or causes problems such as damage to the esophagus.  Treatment for this condition may vary depending on how severe your symptoms are. Your health care provider may recommend diet and lifestyle changes, medicine, or surgery.  Contact a health care provider if you have new or worsening symptoms.  Take over-the-counter and prescription medicines only as told by your health care provider. Do not take aspirin, ibuprofen, or other NSAIDs unless your health care provider told you to do so.  Keep all follow-up visits as told by your health care provider. This is important. This information is not intended to replace advice given to you by your health care provider. Make sure you discuss any questions you have with your health care provider. Document Revised: 09/28/2017 Document Reviewed: 09/28/2017 Elsevier Patient Education  2020 [employer] Inc[employer]  

## 2019-08-09 NOTE — Progress Notes (Signed)
Subjective:  Patient ID: Paula Parker, female    DOB: 1960/09/16  Age: 59 y.o. MRN: [id number]  CC: Hypertension and Gastroesophageal Reflux  This visit occurred during the SARS-CoV-2 public health emergency.  Safety protocols were in place, including screening questions prior to the visit, additional usage of staff PPE, and extensive cleaning of exam room while observing appropriate contact time as indicated for disinfecting solutions.    HPI Paula Parker presents for f/up - She complains of heartburn and occasional sensation of feeling bloated.  She has not taken anything for the heartburn.  She denies odynophagia, dysphagia, loss of appetite.  She complains of weight gain.  Outpatient Medications Prior to Visit  Medication Sig Dispense Refill  . Cholecalciferol 50 MCG (2000 UT) TABS Take 1 tablet (2,000 Units total) by mouth daily. 90 tablet 1  . EDARBI 40 MG TABS TAKE 1 TABLET EACH DAY. 90 tablet 1  . nystatin-triamcinolone ointment (MYCOLOG) Apply 1 application topically 2 (two) times daily. 30 g 0  . pioglitazone (ACTOS) 15 MG tablet Take 1 tablet (15 mg total) by mouth daily. (Patient not taking: Reported on 08/09/2019) 90 tablet 1   No facility-administered medications prior to visit.    ROS Review of Systems  Constitutional: Negative.  Negative for diaphoresis, fatigue and unexpected weight change.  HENT: Negative.  Negative for sore throat, trouble swallowing and voice change.   Eyes: Negative.   Respiratory: Negative for cough, chest tightness, shortness of breath and wheezing.   Cardiovascular: Negative for chest pain, palpitations and leg swelling.  Gastrointestinal: Negative for abdominal distention, abdominal pain, blood in stool, constipation, diarrhea, nausea and vomiting.  Endocrine: Negative.   Genitourinary: Negative.   Musculoskeletal: Negative for arthralgias and myalgias.  Skin: Negative.  Negative for color change and pallor.  Neurological: Negative.   Negative for dizziness, weakness and light-headedness.  Hematological: Negative for adenopathy. Does not bruise/bleed easily.  Psychiatric/Behavioral: Negative.     Objective:  BP 130/74 (BP Location: Left Arm, Patient Position: Sitting, Cuff Size: Large)   Pulse 75   Temp 98.6 F (37 C) (Oral)   Resp 16   Ht 5' (1.524 m)   Wt 172 lb (78 kg)   SpO2 98%   BMI 33.59 kg/m   BP Readings from Last 3 Encounters:  08/09/19 130/74  03/13/19 138/80  02/13/19 134/84    Wt Readings from Last 3 Encounters:  08/09/19 172 lb (78 kg)  03/13/19 167 lb (75.8 kg)  02/13/19 167 lb (75.8 kg)    Physical Exam Vitals reviewed.  Constitutional:      Appearance: Normal appearance.  HENT:     Nose: Nose normal.  Eyes:     General: No scleral icterus.    Conjunctiva/sclera: Conjunctivae normal.  Cardiovascular:     Rate and Rhythm: Normal rate and regular rhythm.     Heart sounds: No murmur.  Pulmonary:     Effort: Pulmonary effort is normal.     Breath sounds: No stridor. No wheezing, rhonchi or rales.  Abdominal:     General: Abdomen is protuberant. Bowel sounds are normal. There is no distension.     Palpations: Abdomen is soft. There is no hepatomegaly, splenomegaly or mass.     Tenderness: There is no abdominal tenderness.  Musculoskeletal:     Cervical back: Neck supple.  Lymphadenopathy:     Cervical: No cervical adenopathy.  Skin:    General: Skin is warm and dry.  Neurological:  General: No focal deficit present.     Mental Status: She is alert.  Psychiatric:        Mood and Affect: Mood normal.        Behavior: Behavior normal.     Lab Results  Component Value Date   WBC 4.8 02/13/2019   HGB 13.8 02/13/2019   HCT 42.3 02/13/2019   PLT 268.0 02/13/2019   GLUCOSE 93 08/09/2019   CHOL 179 02/13/2019   TRIG 55.0 02/13/2019   HDL 62.10 02/13/2019   LDLCALC 106 (H) 02/13/2019   ALT 18 08/09/2019   AST 19 08/09/2019   NA 141 08/09/2019   K 4.4 08/09/2019   CL  107 08/09/2019   CREATININE 0.94 08/09/2019   BUN 20 08/09/2019   CO2 30 08/09/2019   TSH 3.60 02/13/2019    US Abdomen Limited RUQ  Result Date: 11/02/2018 CLINICAL DATA:  Chronic RIGHT upper quadrant pain for 9 months. History of cholecystectomy. EXAM: ULTRASOUND ABDOMEN LIMITED RIGHT UPPER QUADRANT COMPARISON:  None. FINDINGS: Gallbladder: Status post cholecystectomy. Common bile duct: Diameter: 6 mm Liver: Diffusely echogenic suggesting fatty infiltration. No focal mass or lesion is identified within the liver. Portal vein is patent on color Doppler imaging with normal direction of blood flow towards the liver. Other: None. IMPRESSION: 1. Fatty infiltration of the liver. 2. No acute findings. 3. Status post cholecystectomy. Electronically Signed   By: Franki Cabot M.D.   On: 11/02/2018 15:01    Assessment & Plan:   Paula Parker was seen today for hypertension and gastroesophageal reflux.  Diagnoses and all orders for this visit:  Essential hypertension, benign- Her blood pressure is adequately well controlled.  Electrolytes and renal function are normal. -     Basic metabolic panel; Future -     Basic metabolic panel  Nonalcoholic steatohepatitis (NASH)- Her liver enzymes are normal now.  She was praised for lifestyle modifications. -     Hepatic function panel; Future -     Hepatic function panel  Visit for screening mammogram -     MM DIGITAL SCREENING BILATERAL; Future  Gastroesophageal reflux disease without esophagitis- I have asked her to start taking a PPI for this. -     esomeprazole (NEXIUM) 40 MG capsule; Take 1 capsule (40 mg total) by mouth daily at 12 noon.  Need for Tdap vaccination -     Tdap vaccine greater than or equal to 7yo IM   I have discontinued Connye Burkitt. Payton's pioglitazone. I am also having her start on esomeprazole. Additionally, I am having her maintain her Cholecalciferol, Edarbi, and nystatin-triamcinolone ointment.  Meds ordered this encounter    Medications  . esomeprazole (NEXIUM) 40 MG capsule    Sig: Take 1 capsule (40 mg total) by mouth daily at 12 noon.    Dispense:  90 capsule    Refill:  1     Follow-up: Return in about 6 months (around 02/09/2020).  Scarlette Calico, MD

## 2019-08-16 ENCOUNTER — Ambulatory Visit: Payer: No Typology Code available for payment source | Admitting: Internal Medicine

## 2019-08-16 ENCOUNTER — Encounter: Payer: Self-pay | Admitting: Internal Medicine

## 2019-08-16 ENCOUNTER — Other Ambulatory Visit: Payer: Self-pay

## 2019-08-16 VITALS — BP 124/76 | HR 75 | Temp 98.5°F | Ht 60.0 in | Wt 172.4 lb

## 2019-08-16 DIAGNOSIS — K760 Fatty (change of) liver, not elsewhere classified: Secondary | ICD-10-CM | POA: Diagnosis not present

## 2019-08-16 DIAGNOSIS — R197 Diarrhea, unspecified: Secondary | ICD-10-CM | POA: Diagnosis not present

## 2019-08-16 DIAGNOSIS — K219 Gastro-esophageal reflux disease without esophagitis: Secondary | ICD-10-CM | POA: Diagnosis not present

## 2019-08-16 DIAGNOSIS — Z6833 Body mass index (BMI) 33.0-33.9, adult: Secondary | ICD-10-CM

## 2019-08-16 DIAGNOSIS — E669 Obesity, unspecified: Secondary | ICD-10-CM

## 2019-08-16 DIAGNOSIS — E739 Lactose intolerance, unspecified: Secondary | ICD-10-CM

## 2019-08-16 DIAGNOSIS — E66811 Obesity, class 1: Secondary | ICD-10-CM

## 2019-08-16 NOTE — Progress Notes (Signed)
Paula Parker 59 y.o. 02/11/1961 [id number] Referred by: Janith Lima, MD  Assessment & Plan:   Encounter Diagnoses  Name Primary?  . Gastroesophageal reflux disease, unspecified whether esophagitis present Yes  . Diarrhea, unspecified type   . Class 1 obesity without serious comorbidity with body mass index (BMI) of 33.0 to 33.9 in adult, unspecified obesity type   . NAFLD (nonalcoholic fatty liver disease)   . Lactose intolerance     She can use Gaviscon at bedtime and to try to work on lifestyle measures for her reflux disease at this point.  Evaluate upper GI tract with an EGD off PPI and then depending upon how she is add back the PPI.  Note that in the past she thought Nexium caused chest tightness so we will bear that in mind, she has purchased but not yet started the generic Nexium prescribed by Dr. Ronnald Ramp.  I did briefly mention the possibility of a TIF procedure in the past she had a 2 cm hiatal hernia which is the limit of what is acceptable she does not seem interested in pursuing this I was just educating about other treatment possibilities.  It seems quite possible that her diarrhea is postcholecystectomy though she had some similar symptoms prior to cholecystectomy when she saw [place] in 2015.  IBS is certainly in the differential as is inflammatory bowel disease though I doubt that it is possible.  Colonoscopy will be performed to evaluate this.  I am thinking of using a bile salt binder in her depending upon what I find.  Consider celiac biopsies with the EGD.  She does appear to be lactose intolerant by history and she was given handouts regarding this.  Her BMI is high and she has some belly fat issues and we reviewed how difficult weight loss is and how the prevailing information over the decades has been a low-fat diet and how I think that has been a mistake as do many other experts.  I have provided her the link to dietdoctor.com to consider a low-carb diet which  may also help with diarrhea symptoms.  She is encouraged to investigate and consider that over time.  Losing weight may adequately treat her GERD symptoms.   CC: Janith Lima, MD   Subjective:   Chief Complaint:  HPI Paula Parker is a 59 year old white woman with a history of gallstones status post cholecystectomy, C. difficile colitis many years ago and GERD with stricture status post dilation who is here with a history of postprandial diarrhea bloating gas and cramps.  She thinks that this became more of a problem after a cholecystectomy.  She also notices milk and ice cream because the symptoms as well.  No bleeding.  When she has these episodes stools are watery.  They tend to be that way most of the time but she can have some more formed stools.  Previous flex sig many years ago showed C. difficile colitis and an EGD with stricture dilation was performed years ago as well that was in 2004 with a sigmoidoscopy in 2005.  Cholecystectomy was in November 2019  She had seen [place] in 2015 and is scheduled an EGD and colonoscopy for evaluation of epigastric pain and lower abdominal pain and change in bowel habits.  She believes there was a snow that came in because the cancellation and she never did these.  She does have a history of successful treatment with Nexium for GERD but then she felt like it was causing  a chest tightness so she stopped it years ago.  She is not having dysphagia but does have heartburn at times and sometimes has nocturnal reflux.  She eats at 7 PM and goes to bed around 930 or 10.  She has been trying to lose weight by increasing exercise and walking.  She has 1 or 2 caffeinated beverages a day.  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  TSH was normal then as well.  She does have a history of suspected fatty liver.  July 2020 ultrasound showed this.   Allergies  Allergen Reactions  .  Doxycycline Other (See Comments)    Throat swelling  . Fish Allergy     Pt ate flounder and calves swelled  . Benzonatate Rash and Other (See Comments)  . Penicillins Rash    Has patient had a PCN reaction causing immediate rash, facial/tongue/throat swelling, SOB or lightheadedness with hypotension: Yes Has patient had a PCN reaction causing severe rash involving mucus membranes or skin necrosis: No Has patient had a PCN reaction that required hospitalization: No Has patient had a PCN reaction occurring within the last 10 years: Yes If all of the above answers are "NO", then may proceed with Cephalosporin use.    . Sulfonamide Derivatives Rash and Other (See Comments)    unknown   Current Meds  Medication Sig  . EDARBI 40 MG TABS TAKE 1 TABLET EACH DAY.  Marland Kitchen esomeprazole (NEXIUM) 40 MG capsule Take 1 capsule (40 mg total) by mouth daily at 12 noon.  . nystatin-triamcinolone ointment (MYCOLOG) Apply 1 application topically 2 (two) times daily.   Past Medical History:  Diagnosis Date  . Fatty liver   . Gallstones   . GERD (gastroesophageal reflux disease)   . GLAUCOMA, BORDERLINE   . Hiatal hernia   . HTN (hypertension)   . HYPERLIPIDEMIA   . HYPERTENSION   . PALPITATIONS, OCCASIONAL   . Pseudomembranous colitis   . SMOKER    Past Surgical History:  Procedure Laterality Date  . CERVIX LESION DESTRUCTION  1980'S  . CHOLECYSTECTOMY N/A 02/03/2018   Procedure: LAPAROSCOPIC CHOLECYSTECTOMY;  Surgeon: Ileana Roup, MD;  Location: [facility];  Service: General;  Laterality: N/A;  . CHOLECYSTECTOMY    . COLONOSCOPY    . ESOPHAGOGASTRODUODENOSCOPY    . OVARY SURGERY     removed precancerous cells with a laser   Social History   Social History Narrative   Married 1 grown daughter, Production manager at [employer]   Former smoker no alcohol no tobacco now no drug use.  1-2 caffeinated beverages daily.   family history includes ALS in her mother; Coronary artery  disease in an other family member; Diabetes in her maternal grandfather and maternal grandmother; Early death in her sister; Heart disease in her father and sister; Ovarian cancer (age of onset: 37) in her sister.   Review of Systems As per HPI.  Allergy problems sometimes itching and increased thirst.  Objective:   Physical Exam BP 124/76   Pulse 75   Temp 98.5 F (36.9 C)   Ht 5' (1.524 m)   Wt 172 lb 6.4 oz (78.2 kg)   SpO2 98%   BMI 33.67 kg/m  NAD Eyes anicteric Mouth - poor dentition - caries Lungs cta Cor NL abd obese - pinpoint RUQ tenderness I cannot feel a nodule or hernia Ext no c/c/e  Alert and orieted

## 2019-08-16 NOTE — Patient Instructions (Addendum)
Please look at low carb and keto diets on dietdoctor.com and see if you can make some changes in your eating.  Hold your Nexium.  You may take 1-2 tablespoons of Gaviscon at bedtime.   We are giving you GERD information to read and follow.   You have been scheduled for an endoscopy and colonoscopy. Please follow the written instructions given to you at your visit today. Please pick up your prep supplies at the pharmacy within the next 1-3 days. If you use inhalers (even only as needed), please bring them with you on the day of your procedure.   I appreciate the opportunity to care for you. Silvano Rusk, MD, [facility]

## 2019-08-28 ENCOUNTER — Encounter: Payer: Self-pay | Admitting: Internal Medicine

## 2019-08-29 ENCOUNTER — Encounter: Payer: Self-pay | Admitting: Internal Medicine

## 2019-08-30 DIAGNOSIS — Z0279 Encounter for issue of other medical certificate: Secondary | ICD-10-CM

## 2019-08-30 NOTE — Telephone Encounter (Signed)
We have received forms. Forms have been completed &Placed in providers box to review and sign.

## 2019-08-30 NOTE — Telephone Encounter (Signed)
Forms have been signed, Copy sent to scan &Charged for.   Patient informed and original mailed for her records.

## 2019-09-11 ENCOUNTER — Ambulatory Visit
Admission: RE | Admit: 2019-09-11 | Discharge: 2019-09-11 | Disposition: A | Discharge status: Home / Self Care | Payer: No Typology Code available for payment source | Source: Ambulatory Visit | Attending: Internal Medicine | Admitting: Internal Medicine

## 2019-09-11 ENCOUNTER — Other Ambulatory Visit: Payer: Self-pay

## 2019-09-11 DIAGNOSIS — Z1231 Encounter for screening mammogram for malignant neoplasm of breast: Secondary | ICD-10-CM

## 2019-09-12 LAB — HM MAMMOGRAPHY

## 2019-09-25 ENCOUNTER — Ambulatory Visit (INDEPENDENT_AMBULATORY_CARE_PROVIDER_SITE_OTHER): Payer: No Typology Code available for payment source

## 2019-09-25 ENCOUNTER — Other Ambulatory Visit: Payer: Self-pay | Admitting: Internal Medicine

## 2019-09-25 DIAGNOSIS — Z1159 Encounter for screening for other viral diseases: Secondary | ICD-10-CM

## 2019-09-26 LAB — SARS CORONAVIRUS 2 (TAT 6-24 HRS): SARS Coronavirus 2: NEGATIVE

## 2019-09-27 ENCOUNTER — Ambulatory Visit (AMBULATORY_SURGERY_CENTER): Payer: No Typology Code available for payment source | Admitting: Internal Medicine

## 2019-09-27 ENCOUNTER — Other Ambulatory Visit: Payer: Self-pay

## 2019-09-27 ENCOUNTER — Encounter: Payer: Self-pay | Admitting: Internal Medicine

## 2019-09-27 VITALS — BP 115/73 | HR 66 | Temp 97.5°F | Resp 14 | Ht 60.0 in | Wt 172.0 lb

## 2019-09-27 DIAGNOSIS — K219 Gastro-esophageal reflux disease without esophagitis: Secondary | ICD-10-CM

## 2019-09-27 DIAGNOSIS — K2100 Gastro-esophageal reflux disease with esophagitis, without bleeding: Secondary | ICD-10-CM

## 2019-09-27 DIAGNOSIS — K2210 Ulcer of esophagus without bleeding: Secondary | ICD-10-CM

## 2019-09-27 DIAGNOSIS — D121 Benign neoplasm of appendix: Secondary | ICD-10-CM | POA: Diagnosis not present

## 2019-09-27 DIAGNOSIS — K635 Polyp of colon: Secondary | ICD-10-CM | POA: Diagnosis not present

## 2019-09-27 DIAGNOSIS — R197 Diarrhea, unspecified: Secondary | ICD-10-CM | POA: Diagnosis not present

## 2019-09-27 DIAGNOSIS — D125 Benign neoplasm of sigmoid colon: Secondary | ICD-10-CM

## 2019-09-27 DIAGNOSIS — K449 Diaphragmatic hernia without obstruction or gangrene: Secondary | ICD-10-CM

## 2019-09-27 LAB — HM COLONOSCOPY

## 2019-09-27 MED ORDER — SODIUM CHLORIDE 0.9 % IV SOLN: 500.0000 mL | INTRAVENOUS | Status: DC

## 2019-09-27 NOTE — Patient Instructions (Addendum)
There are signs of acid reflux damage in the esophagus - esophagitis.  Hiatal hernia is a bit larger now.  Two colon polyps removed.  I also took intestinal biopsies to see if that shows [place] why you are having diarrhea.  Please go ahead and start taking the generic Nexium (esomeprazole) 40 mg every day. Take it before breakfast - about 30 mins before is best.  Also avoid foods that cause acid reflux - see the handout.  I appreciate the opportunity to care for you. Gatha Mayer, MD, [facility]   Discharge instructions given. Handouts on polyps,esophagitis,hiatal hernia. Resume previous medications. YOU HAD AN ENDOSCOPIC PROCEDURE TODAY AT [facility]:   Refer to the procedure report that was given to you for any specific questions about what was found during the examination.  If the procedure report does not answer your questions, please call your gastroenterologist to clarify.  If you requested that your care partner not be given the details of your procedure findings, then the procedure report has been included in a sealed envelope for you to review at your convenience later.  YOU SHOULD EXPECT: Some feelings of bloating in the abdomen. Passage of more gas than usual.  Walking can help get rid of the air that was put into your GI tract during the procedure and reduce the bloating. If you had a lower endoscopy (such as a colonoscopy or flexible sigmoidoscopy) you may notice spotting of blood in your stool or on the toilet paper. If you underwent a bowel prep for your procedure, you may not have a normal bowel movement for a few days.  Please Note:  You might notice some irritation and congestion in your nose or some drainage.  This is from the oxygen used during your procedure.  There is no need for concern and it should clear up in a day or so.  SYMPTOMS TO REPORT IMMEDIATELY:   Following lower endoscopy (colonoscopy or flexible sigmoidoscopy):  Excessive amounts of blood  in the stool  Significant tenderness or worsening of abdominal pains  Swelling of the abdomen that is new, acute  Fever of 100F or higher   Following upper endoscopy (EGD)  Vomiting of blood or coffee ground material  New chest pain or pain under the shoulder blades  Painful or persistently difficult swallowing  New shortness of breath  Fever of 100F or higher  Black, tarry-looking stools  For urgent or emergent issues, a gastroenterologist can be reached at any hour by calling [phone]. Do not use MyChart messaging for urgent concerns.    DIET:  We do recommend a small meal at first, but then you may proceed to your regular diet.  Drink plenty of fluids but you should avoid alcoholic beverages for 24 hours.  ACTIVITY:  You should plan to take it easy for the rest of today and you should NOT DRIVE or use heavy machinery until tomorrow (because of the sedation medicines used during the test).    FOLLOW UP: Our staff will call the number listed on your records 48-72 hours following your procedure to check on you and address any questions or concerns that you may have regarding the information given to you following your procedure. If we do not reach you, we will leave a message.  We will attempt to reach you two times.  During this call, we will ask if you have developed any symptoms of COVID 19. If you develop any symptoms (ie: fever, flu-like symptoms,  shortness of breath, cough etc.) before then, please call [phone].  If you test positive for Covid 19 in the 2 weeks post procedure, please call and report this information to [place].    If any biopsies were taken you will be contacted by phone or by letter within the next 1-3 weeks.  Please call us at [phone] if you have not heard about the biopsies in 3 weeks.    SIGNATURES/CONFIDENTIALITY: You and/or your care partner have signed paperwork which will be entered into your electronic medical record.  These signatures  attest to the fact that that the information above on your After Visit Summary has been reviewed and is understood.  Full responsibility of the confidentiality of this discharge information lies with you and/or your care-partner.

## 2019-09-27 NOTE — Op Note (Signed)
[facility] Patient Name: Paula Parker Procedure Date: 09/27/2019 2:02 PM MRN: [id number] Endoscopist: Gatha Mayer , MD Age: 59 Referring MD:  Date of Birth: 1960/09/18 Gender: Female Account #: [id number] Procedure:                Colonoscopy Indications:              Clinically significant diarrhea of unexplained                            origin Medicines:                Propofol per Anesthesia, Monitored Anesthesia Care Procedure:                Pre-Anesthesia Assessment:                           - Prior to the procedure, a History and Physical                            was performed, and patient medications and                            allergies were reviewed. The patient's tolerance of                            previous anesthesia was also reviewed. The risks                            and benefits of the procedure and the sedation                            options and risks were discussed with the patient.                            All questions were answered, and informed consent                            was obtained. Prior Anticoagulants: The patient has                            taken no previous anticoagulant or antiplatelet                            agents. ASA Grade Assessment: II - A patient with                            mild systemic disease. After reviewing the risks                            and benefits, the patient was deemed in                            satisfactory condition to undergo the procedure.  After obtaining informed consent, the colonoscope                            was passed under direct vision. Throughout the                            procedure, the patient's blood pressure, pulse, and                            oxygen saturations were monitored continuously. The                            Colonoscope was introduced through the anus and                            advanced to the the terminal ileum,  with                            identification of the appendiceal orifice and IC                            valve. The colonoscopy was performed without                            difficulty. The patient tolerated the procedure                            well. The quality of the bowel preparation was                            good. The terminal ileum, ileocecal valve,                            appendiceal orifice, and rectum were photographed.                            The bowel preparation used was Miralax via split                            dose instruction. Scope In: 2:22:20 PM Scope Out: 2:36:23 PM Scope Withdrawal Time: 0 hours 12 minutes 33 seconds  Total Procedure Duration: 0 hours 14 minutes 3 seconds  Findings:                 The perianal and digital rectal examinations were                            normal.                           A diminutive polyp was found in the appendiceal                            orifice. The polyp was sessile. The polyp was  removed with a cold snare. Resection and retrieval                            were complete. Verification of patient                            identification for the specimen was done. Estimated                            blood loss was minimal.                           A diminutive polyp was found in the sigmoid colon.                            The polyp was sessile. The polyp was removed with a                            cold snare. Resection and retrieval were complete.                            Verification of patient identification for the                            specimen was done. Estimated blood loss was minimal.                           Multiple diverticula were found in the sigmoid                            colon.                           The terminal ileum appeared normal.                           The exam was otherwise without abnormality on                            direct  and retroflexion views.                           Biopsies for histology were taken with a cold                            forceps from the ascending colon, transverse colon,                            descending colon and sigmoid colon for evaluation                            of microscopic colitis. Complications:            No immediate complications. Estimated Blood Loss:     Estimated blood loss was minimal. Estimated blood  loss was minimal. Impression:               - One diminutive polyp at the appendiceal orifice,                            removed with a cold snare. Resected and retrieved.                           - One diminutive polyp in the sigmoid colon,                            removed with a cold snare. Resected and retrieved.                           - Diverticulosis in the sigmoid colon.                           - The examined portion of the ileum was normal.                           - The examination was otherwise normal on direct                            and retroflexion views.                           - Biopsies were taken with a cold forceps from the                            ascending colon, transverse colon, descending colon                            and sigmoid colon for evaluation of microscopic                            colitis. Recommendation:           - Patient has a contact number available for                            emergencies. The signs and symptoms of potential                            delayed complications were discussed with the                            patient. Return to normal activities tomorrow.                            Written discharge instructions were provided to the                            patient.                           -  Resume previous diet.                           - Continue present medications.                           - Await pathology results.                           - Repeat  colonoscopy is recommended. The                            colonoscopy date will be determined after pathology                            results from today's exam become available for                            review. Gatha Mayer, MD 09/27/2019 2:57:52 PM This report has been signed electronically.

## 2019-09-27 NOTE — Progress Notes (Signed)
Called to room to assist during endoscopic procedure.  Patient ID and intended procedure confirmed with present staff. Received instructions for my participation in the procedure from the performing physician.  

## 2019-09-27 NOTE — Progress Notes (Signed)
To PACU, VSS. Report to Rn. VO for additional meds.tb

## 2019-09-27 NOTE — Progress Notes (Signed)
Vs VV

## 2019-09-27 NOTE — Op Note (Signed)
[facility] Patient Name: Paula Parker Procedure Date: 09/27/2019 2:02 PM MRN: [id number] Endoscopist: Gatha Mayer , MD Age: 59 Referring MD:  Date of Birth: [birth year] Gender: Female Account #: [id number] Procedure:                Upper GI endoscopy Indications:              Heartburn, Esophageal reflux, Follow-up of                            esophageal reflux Medicines:                Propofol per Anesthesia, Monitored Anesthesia Care Procedure:                Pre-Anesthesia Assessment:                           - Prior to the procedure, a History and Physical                            was performed, and patient medications and                            allergies were reviewed. The patient's tolerance of                            previous anesthesia was also reviewed. The risks                            and benefits of the procedure and the sedation                            options and risks were discussed with the patient.                            All questions were answered, and informed consent                            was obtained. Prior Anticoagulants: The patient has                            taken no previous anticoagulant or antiplatelet                            agents. ASA Grade Assessment: II - A patient with                            mild systemic disease. After reviewing the risks                            and benefits, the patient was deemed in                            satisfactory condition to undergo the procedure.  After obtaining informed consent, the endoscope was                            passed under direct vision. Throughout the                            procedure, the patient's blood pressure, pulse, and                            oxygen saturations were monitored continuously. The                            Endoscope was introduced through the mouth, and                            advanced to the second  part of duodenum. The upper                            GI endoscopy was accomplished without difficulty.                            The patient tolerated the procedure well. Scope In: Scope Out: Findings:                 LA Grade B (one or more mucosal breaks greater than                            5 mm, not extending between the tops of two mucosal                            folds) esophagitis was found in the distal                            esophagus. Biopsies were taken with a cold forceps                            for histology. Verification of patient                            identification for the specimen was done. Estimated                            blood loss was minimal.                           A 5 cm hiatal hernia was present.                           The exam was otherwise without abnormality.                           The cardia and gastric fundus were normal on  retroflexion.                           Biopsies for histology were taken with a cold                            forceps in the entire duodenum for evaluation of                            celiac disease. Verification of patient                            identification for the specimen was done. Estimated                            blood loss was minimal. Complications:            No immediate complications. Estimated Blood Loss:     Estimated blood loss was minimal. Impression:               - LA Grade B reflux esophagitis. Biopsied.                           - 5 cm hiatal hernia.                           - The examination was otherwise normal.                           - Biopsies were taken with a cold forceps for                            evaluation of celiac disease. Recommendation:           - Patient has a contact number available for                            emergencies. The signs and symptoms of potential                            delayed complications were discussed  with the                            patient. Return to normal activities tomorrow.                            Written discharge instructions were provided to the                            patient.                           - Resume previous diet.                           - Follow an antireflux regimen.                           -  Go ahead and start previously prescribed                            esomeprazole 40 mg every AM Gatha Mayer, MD 09/27/2019 2:47:53 PM This report has been signed electronically.

## 2019-10-01 ENCOUNTER — Telehealth: Payer: Self-pay

## 2019-10-01 NOTE — Telephone Encounter (Signed)
  Follow up Call-  Call back number 09/27/2019  Post procedure Call Back phone  # [phone]  Permission to leave phone message Yes  Some recent data might be hidden     Patient questions:  Do you have a fever, pain , or abdominal swelling? No. Pain Score  0 *  Have you tolerated food without any problems? Yes.    Have you been able to return to your normal activities? Yes.    Do you have any questions about your discharge instructions: Diet   No. Medications  No. Follow up visit  No.  Do you have questions or concerns about your Care? No.  Actions: * If pain score is 4 or above: No action needed, pain <4.  Pt. Said her throat was a little sore, but was swallowing fine.  Maw  1. Have you developed a fever since your procedure? no  2.   Have you had an respiratory symptoms (SOB or cough) since your procedure? no  3.   Have you tested positive for COVID 19 since your procedure no  4.   Have you had any family members/close contacts diagnosed with the COVID 19 since your procedure?  mo   If yes to any of these questions please route to Joylene John, RN and Erenest Rasher, RN

## 2019-10-02 ENCOUNTER — Encounter: Payer: No Typology Code available for payment source | Admitting: Internal Medicine

## 2019-10-04 ENCOUNTER — Other Ambulatory Visit: Payer: Self-pay | Admitting: Internal Medicine

## 2019-10-04 MED ORDER — COLESTIPOL HCL 5 G PO GRAN: 5.0000 g | GRANULES | Freq: Every day | ORAL | 0 refills | Status: DC

## 2019-10-04 MED FILL — COLESTIPOL HCL 5 GM GRAN: 5 | 90 days supply | Qty: 500 | Fill #0

## 2019-10-05 ENCOUNTER — Telehealth: Payer: Self-pay | Admitting: Internal Medicine

## 2019-10-05 NOTE — Telephone Encounter (Signed)
500 g is fine

## 2019-10-05 NOTE — Telephone Encounter (Signed)
Please advise Sir, thank you. 

## 2019-10-05 NOTE — Telephone Encounter (Signed)
[facility] pharmacy informed that the 500g is okay per Dr Carlean Purl.

## 2019-11-05 ENCOUNTER — Telehealth: Payer: Self-pay | Admitting: Emergency Medicine

## 2019-11-05 NOTE — Telephone Encounter (Signed)
Pt called to get clarification on the other meds she was speaking of: Edarbi & esomeprazole. Will route to Dr Ronnald Ramp.

## 2019-11-05 NOTE — Telephone Encounter (Signed)
Pt would like to know if it is ok for her to take the Covid vaccine with on the medication nystatin-triamcinolone ointment (MYCOLOG). She also has question about taking certain medications the day of getting the vaccine. Please give her a call back thanks.

## 2019-11-06 NOTE — Telephone Encounter (Signed)
Per PCP - okay to take all meds prior to covid vaccine.   Pt contacted and informed of same. Pt stated understanding and will call us back if we have any questions.

## 2019-12-31 MED FILL — ESOMEPRAZOLE MAG DR 40 MG C: 40 | 90 days supply | Qty: 90 | Fill #1

## 2020-01-07 ENCOUNTER — Other Ambulatory Visit: Payer: Self-pay | Admitting: Internal Medicine

## 2020-01-07 DIAGNOSIS — I10 Essential (primary) hypertension: Secondary | ICD-10-CM

## 2020-02-07 ENCOUNTER — Ambulatory Visit: Payer: No Typology Code available for payment source

## 2020-02-12 ENCOUNTER — Other Ambulatory Visit: Payer: Self-pay

## 2020-02-12 ENCOUNTER — Encounter: Payer: Self-pay | Admitting: Internal Medicine

## 2020-02-12 ENCOUNTER — Ambulatory Visit (INDEPENDENT_AMBULATORY_CARE_PROVIDER_SITE_OTHER): Payer: No Typology Code available for payment source | Admitting: Internal Medicine

## 2020-02-12 ENCOUNTER — Other Ambulatory Visit: Payer: Self-pay | Admitting: Internal Medicine

## 2020-02-12 VITALS — BP 144/96 | HR 93 | Temp 98.8°F | Resp 16 | Ht 60.0 in | Wt 180.0 lb

## 2020-02-12 DIAGNOSIS — I10 Essential (primary) hypertension: Secondary | ICD-10-CM | POA: Diagnosis not present

## 2020-02-12 DIAGNOSIS — K700 Alcoholic fatty liver: Secondary | ICD-10-CM | POA: Diagnosis not present

## 2020-02-12 LAB — CBC WITH DIFFERENTIAL/PLATELET
Basophils Absolute: 0 10*3/uL (ref 0.0–0.1)
Basophils Relative: 0.9 % (ref 0.0–3.0)
Eosinophils Absolute: 0.2 10*3/uL (ref 0.0–0.7)
Eosinophils Relative: 3.2 % (ref 0.0–5.0)
HCT: 42.6 % (ref 36.0–46.0)
Hemoglobin: 13.9 g/dL (ref 12.0–15.0)
Lymphocytes Relative: 33.2 % (ref 12.0–46.0)
Lymphs Abs: 1.7 10*3/uL (ref 0.7–4.0)
MCHC: 32.5 g/dL (ref 30.0–36.0)
MCV: 94.5 fl (ref 78.0–100.0)
Monocytes Absolute: 0.5 10*3/uL (ref 0.1–1.0)
Monocytes Relative: 10 % (ref 3.0–12.0)
Neutro Abs: 2.7 10*3/uL (ref 1.4–7.7)
Neutrophils Relative %: 52.7 % (ref 43.0–77.0)
Platelets: 292 10*3/uL (ref 150.0–400.0)
RBC: 4.51 Mil/uL (ref 3.87–5.11)
RDW: 13.6 % (ref 11.5–15.5)
WBC: 5.1 10*3/uL (ref 4.0–10.5)

## 2020-02-12 LAB — BASIC METABOLIC PANEL
BUN: 18 mg/dL (ref 6–23)
CO2: 29 mEq/L (ref 19–32)
Calcium: 9.2 mg/dL (ref 8.4–10.5)
Chloride: 105 mEq/L (ref 96–112)
Creatinine, Ser: 1.02 mg/dL (ref 0.40–1.20)
GFR: 60.68 mL/min (ref >60.00)
Glucose, Bld: 97 mg/dL (ref 70–99)
Potassium: 4.3 mEq/L (ref 3.5–5.1)
Sodium: 139 mEq/L (ref 135–145)

## 2020-02-12 LAB — HEPATIC FUNCTION PANEL
ALT: 28 U/L (ref 0–35)
AST: 25 U/L (ref 0–37)
Albumin: 4.1 g/dL (ref 3.5–5.2)
Alkaline Phosphatase: 51 U/L (ref 39–117)
Bilirubin, Direct: 0.1 mg/dL (ref 0.0–0.3)
Total Bilirubin: 0.4 mg/dL (ref 0.2–1.2)
Total Protein: 6.3 g/dL (ref 6.0–8.3)

## 2020-02-12 LAB — TSH: TSH: 2.16 u[IU]/mL (ref 0.35–4.50)

## 2020-02-12 MED ORDER — INDAPAMIDE 1.25 MG PO TABS: 1.2500 mg | ORAL_TABLET | Freq: Every day | ORAL | 0 refills | Status: DC

## 2020-02-12 MED FILL — INDAPAMIDE 1.25 MG TABS: 1.25 | 90 days supply | Qty: 90 | Fill #0

## 2020-02-12 NOTE — Progress Notes (Signed)
Subjective:  Patient ID: Paula Parker, female    DOB: [birth year]  Age: 59 y.o. MRN: [id number]  CC: Hypertension  This visit occurred during the SARS-CoV-2 public health emergency.  Safety protocols were in place, including screening questions prior to the visit, additional usage of staff PPE, and extensive cleaning of exam room while observing appropriate contact time as indicated for disinfecting solutions.    HPI Paula Parker presents for f/up - She does monitor her BP exercise. She complains of chronic fatigue, weight gain, and leg edema. She is taking the ARB.  Outpatient Medications Prior to Visit  Medication Sig Dispense Refill   Cholecalciferol 50 MCG (2000 UT) TABS Take 1 tablet (2,000 Units total) by mouth daily. 90 tablet 1   colestipol (COLESTID) 5 g granules Take 5 g by mouth daily with supper. 300 g 0   EDARBI 40 MG TABS TAKE 1 TABLET EACH DAY. 90 tablet 0   esomeprazole (NEXIUM) 40 MG capsule Take 1 capsule (40 mg total) by mouth daily before breakfast. 90 capsule 1   nystatin-triamcinolone ointment (MYCOLOG) Apply 1 application topically 2 (two) times daily. 30 g 0   No facility-administered medications prior to visit.    ROS Review of Systems  Constitutional: Positive for fatigue and unexpected weight change (wt gain). Negative for appetite change, chills, diaphoresis and fever.  HENT: Negative.   Eyes: Negative.   Respiratory: Negative for cough, chest tightness, shortness of breath and wheezing.   Cardiovascular: Positive for leg swelling. Negative for chest pain and palpitations.  Gastrointestinal: Negative for abdominal pain, diarrhea and nausea.  Endocrine: Negative.   Genitourinary: Negative.  Negative for difficulty urinating.  Musculoskeletal: Negative.  Negative for back pain and myalgias.  Skin: Negative.   Neurological: Negative for dizziness, weakness and light-headedness.  Hematological: Negative for adenopathy. Does not bruise/bleed  easily.  Psychiatric/Behavioral: Negative.     Objective:  BP (!) 144/96 Comment: BP(R) 144/96 (L) 142/98   Pulse 93    Temp 98.8 F (37.1 C) (Oral)    Resp 16    Ht 5' (1.524 m)    Wt 180 lb (81.6 kg)    SpO2 98%    BMI 35.15 kg/m   BP Readings from Last 3 Encounters:  02/12/20 (!) 144/96  09/27/19 115/73  08/16/19 124/76    Wt Readings from Last 3 Encounters:  02/12/20 180 lb (81.6 kg)  09/27/19 172 lb (78 kg)  08/16/19 172 lb 6.4 oz (78.2 kg)    Physical Exam Vitals reviewed.  HENT:     Nose: Nose normal.     Mouth/Throat:     Mouth: Mucous membranes are moist.  Eyes:     General: No scleral icterus.    Conjunctiva/sclera: Conjunctivae normal.  Cardiovascular:     Rate and Rhythm: Normal rate and regular rhythm.     Heart sounds: No murmur heard.   Pulmonary:     Effort: Pulmonary effort is normal.     Breath sounds: No stridor. No wheezing, rhonchi or rales.  Abdominal:     General: Abdomen is flat.     Palpations: There is no mass.     Tenderness: There is no abdominal tenderness. There is no guarding.  Musculoskeletal:     Cervical back: Neck supple.     Right lower leg: Edema (trace pitting) present.     Left lower leg: Edema (trace pitting) present.  Lymphadenopathy:     Cervical: No cervical adenopathy.  Skin:  General: Skin is warm and dry.  Neurological:     Mental Status: She is alert.  Psychiatric:        Mood and Affect: Mood normal.        Behavior: Behavior normal.     Lab Results  Component Value Date   WBC 5.1 02/12/2020   HGB 13.9 02/12/2020   HCT 42.6 02/12/2020   PLT 292.0 02/12/2020   GLUCOSE 97 02/12/2020   CHOL 179 02/13/2019   TRIG 55.0 02/13/2019   HDL 62.10 02/13/2019   LDLCALC 106 (H) 02/13/2019   ALT 28 02/12/2020   AST 25 02/12/2020   NA 139 02/12/2020   K 4.3 02/12/2020   CL 105 02/12/2020   CREATININE 1.02 02/12/2020   BUN 18 02/12/2020   CO2 29 02/12/2020   TSH 2.16 02/12/2020    MM DIGITAL SCREENING  BILATERAL  Result Date: 09/12/2019 CLINICAL DATA:  Screening. EXAM: DIGITAL SCREENING BILATERAL MAMMOGRAM WITH CAD COMPARISON:  None. ACR Breast Density Category b: There are scattered areas of fibroglandular density. FINDINGS: There are no findings suspicious for malignancy. Images were processed with CAD. IMPRESSION: No mammographic evidence of malignancy. A result letter of this screening mammogram will be mailed directly to the patient. RECOMMENDATION: Screening mammogram in one year. (Code:SM-B-01Y) BI-RADS CATEGORY  1: Negative. Electronically Signed   By: Claudie Revering M.D.   On: 09/12/2019 09:17    Assessment & Plan:   Diedre was seen today for hypertension.  Diagnoses and all orders for this visit:  Essential hypertension, benign- She has not achieved her BP goal of 130/80. Labs are negative for secondary causes or end organ damage. Will add a thiazide diuretic to the ARB. She will work on her lifestyle modifications. -     CBC with Differential/Platelet; Future -     Basic metabolic panel; Future -     TSH; Future -     indapamide (LOZOL) 1.25 MG tablet; Take 1 tablet (1.25 mg total) by mouth daily. -     TSH -     Basic metabolic panel -     CBC with Differential/Platelet  Alcoholic fatty liver- Her LFT's are normal now. -     Hepatic function panel; Future -     Hepatic function panel   I am having Connye Burkitt. Kemmerling start on indapamide. I am also having her maintain her Cholecalciferol, nystatin-triamcinolone ointment, esomeprazole, colestipol, and Edarbi.  Meds ordered this encounter  Medications   indapamide (LOZOL) 1.25 MG tablet    Sig: Take 1 tablet (1.25 mg total) by mouth daily.    Dispense:  90 tablet    Refill:  0     Follow-up: Return in about 3 months (around 05/14/2020).  Scarlette Calico, MD

## 2020-02-12 NOTE — Patient Instructions (Signed)

## 2020-02-21 ENCOUNTER — Telehealth: Payer: Self-pay | Admitting: Internal Medicine

## 2020-02-21 NOTE — Telephone Encounter (Signed)
I received renewal FMLA forms via fax from [employer]. Forms have been completed &Placed in providers box to review and sign.

## 2020-02-22 DIAGNOSIS — Z0279 Encounter for issue of other medical certificate: Secondary | ICD-10-CM

## 2020-02-22 NOTE — Telephone Encounter (Signed)
Forms have been signed, Faxed to [employer] @ [phone], Copy sent to scan &Charged for.   LVM to inform patient and original mailed to patient for her records.

## 2020-03-14 ENCOUNTER — Encounter: Payer: Self-pay | Admitting: Internal Medicine

## 2020-04-07 ENCOUNTER — Other Ambulatory Visit: Payer: Self-pay | Admitting: Internal Medicine

## 2020-04-07 DIAGNOSIS — I10 Essential (primary) hypertension: Secondary | ICD-10-CM

## 2020-04-10 ENCOUNTER — Other Ambulatory Visit: Payer: Self-pay | Admitting: Internal Medicine

## 2020-04-10 DIAGNOSIS — K219 Gastro-esophageal reflux disease without esophagitis: Secondary | ICD-10-CM

## 2020-04-10 MED FILL — ESOMEPRAZOLE MAG DR 40 MG C: 40 | 90 days supply | Qty: 90 | Fill #0

## 2020-05-07 ENCOUNTER — Other Ambulatory Visit: Payer: Self-pay | Admitting: Internal Medicine

## 2020-05-07 DIAGNOSIS — I10 Essential (primary) hypertension: Secondary | ICD-10-CM

## 2020-05-07 MED FILL — INDAPAMIDE 1.25 MG TABLET: 1.25 | 90 days supply | Qty: 90 | Fill #0

## 2020-05-11 ENCOUNTER — Emergency Department
Admission: EM | Admit: 2020-05-11 | Discharge: 2020-05-11 | Disposition: A | Discharge status: Home / Self Care | Payer: No Typology Code available for payment source | Source: Home / Self Care | Attending: Emergency Medicine | Admitting: Emergency Medicine

## 2020-05-11 ENCOUNTER — Encounter: Payer: Self-pay | Admitting: Emergency Medicine

## 2020-05-11 ENCOUNTER — Other Ambulatory Visit: Payer: Self-pay

## 2020-05-11 ENCOUNTER — Emergency Department: Payer: No Typology Code available for payment source

## 2020-05-11 ENCOUNTER — Emergency Department
Admission: EM | Admit: 2020-05-11 | Discharge: 2020-05-11 | Disposition: A | Discharge status: Home / Self Care | Payer: No Typology Code available for payment source | Attending: Emergency Medicine | Admitting: Emergency Medicine

## 2020-05-11 DIAGNOSIS — Z87891 Personal history of nicotine dependence: Secondary | ICD-10-CM | POA: Diagnosis not present

## 2020-05-11 DIAGNOSIS — W108XXA Fall (on) (from) other stairs and steps, initial encounter: Secondary | ICD-10-CM | POA: Insufficient documentation

## 2020-05-11 DIAGNOSIS — E860 Dehydration: Secondary | ICD-10-CM | POA: Insufficient documentation

## 2020-05-11 DIAGNOSIS — Y92009 Unspecified place in unspecified non-institutional (private) residence as the place of occurrence of the external cause: Secondary | ICD-10-CM | POA: Insufficient documentation

## 2020-05-11 DIAGNOSIS — I10 Essential (primary) hypertension: Secondary | ICD-10-CM | POA: Insufficient documentation

## 2020-05-11 DIAGNOSIS — S82001A Unspecified fracture of right patella, initial encounter for closed fracture: Secondary | ICD-10-CM | POA: Insufficient documentation

## 2020-05-11 DIAGNOSIS — Z79899 Other long term (current) drug therapy: Secondary | ICD-10-CM | POA: Insufficient documentation

## 2020-05-11 DIAGNOSIS — R55 Syncope and collapse: Secondary | ICD-10-CM

## 2020-05-11 DIAGNOSIS — S8991XA Unspecified injury of right lower leg, initial encounter: Secondary | ICD-10-CM | POA: Diagnosis present

## 2020-05-11 LAB — CBC
HCT: 41.3 % (ref 36.0–46.0)
Hemoglobin: 13.9 g/dL (ref 12.0–15.0)
MCH: 31.4 pg (ref 26.0–34.0)
MCHC: 33.7 g/dL (ref 30.0–36.0)
MCV: 93.2 fL (ref 80.0–100.0)
Platelets: 305 10*3/uL (ref 150–400)
RBC: 4.43 MIL/uL (ref 3.87–5.11)
RDW: 13.1 % (ref 11.5–15.5)
WBC: 9.6 10*3/uL (ref 4.0–10.5)
nRBC: 0 % (ref 0.0–0.2)

## 2020-05-11 LAB — URINALYSIS, COMPLETE (UACMP) WITH MICROSCOPIC
Bilirubin Urine: NEGATIVE
Glucose, UA: NEGATIVE mg/dL
Ketones, ur: NEGATIVE mg/dL
Nitrite: NEGATIVE
Protein, ur: NEGATIVE mg/dL
Specific Gravity, Urine: 1.025 (ref 1.005–1.030)
pH: 5 (ref 5.0–8.0)

## 2020-05-11 LAB — BASIC METABOLIC PANEL
Anion gap: 11 (ref 5–15)
BUN: 22 mg/dL — ABNORMAL HIGH (ref 6–20)
CO2: 25 mmol/L (ref 22–32)
Calcium: 9.4 mg/dL (ref 8.9–10.3)
Chloride: 102 mmol/L (ref 98–111)
Creatinine, Ser: 1.32 mg/dL — ABNORMAL HIGH (ref 0.44–1.00)
GFR, Estimated: 47 mL/min — ABNORMAL LOW (ref >60)
Glucose, Bld: 150 mg/dL — ABNORMAL HIGH (ref 70–99)
Potassium: 3.7 mmol/L (ref 3.5–5.1)
Sodium: 138 mmol/L (ref 135–145)

## 2020-05-11 LAB — POC URINE PREG, ED: Preg Test, Ur: NEGATIVE

## 2020-05-11 MED ORDER — SODIUM CHLORIDE 0.9 % IV BOLUS
500.0000 mL | Freq: Once | INTRAVENOUS | Status: AC
  Administered 2020-05-11: 500 mL via INTRAVENOUS

## 2020-05-11 MED ORDER — TRAMADOL HCL 50 MG PO TABS: 50.0000 mg | ORAL_TABLET | Freq: Four times a day (QID) | ORAL | 0 refills | Status: DC | PRN

## 2020-05-11 MED ORDER — MELOXICAM 15 MG PO TABS
15.0000 mg | ORAL_TABLET | Freq: Every day | ORAL | 2 refills | Status: DC
Start: 2020-05-11 — End: 2020-05-15

## 2020-05-11 NOTE — ED Provider Notes (Signed)
[facility] Emergency Department Provider Note  ____________________________________________   Event Date/Time   First MD Initiated Contact with Patient 05/11/20 1347     (approximate)  I have reviewed the triage vital signs and the nursing notes.   HISTORY  Chief Complaint Fall and Knee Pain    HPI Paula Parker is a 60 y.o. female presents to the ER after a fall while trying to take her dog out, happened at home on her steps, landed directly on the right knee, pain when she bears weight, pain with extension and flexion, no numbness or tingling, no other injuries reported    Past Medical History:  Diagnosis Date  . Fatty liver   . Gallstones   . GERD (gastroesophageal reflux disease)   . GLAUCOMA, BORDERLINE   . Hiatal hernia   . HTN (hypertension)   . HYPERLIPIDEMIA   . HYPERTENSION   . PALPITATIONS, OCCASIONAL   . Pseudomembranous colitis   . SMOKER     Patient Active Problem List   Diagnosis Date Noted  . Gastroesophageal reflux disease without esophagitis 08/09/2019  . Nonalcoholic steatohepatitis (NASH) 11/02/2018  . Irritable bowel syndrome with diarrhea 10/10/2018  . Visit for screening mammogram 04/27/2018  . Vitamin D deficiency disease 10/11/2017  . DJD (degenerative joint disease), ankle and foot, left 07/15/2016  . Osteoporosis 12/05/2014  . Right lumbar radiculitis 12/05/2014  . Allergic rhinitis 07/19/2013  . GERD (gastroesophageal reflux disease) 04/17/2013  . Colon cancer screening 04/17/2013  . Atrophic vaginitis 02/03/2012  . Routine general medical examination at a health care facility 01/24/2012  . Alcoholic fatty liver 28/31/5176  . GLAUCOMA, BORDERLINE 04/27/2010  . HIATAL HERNIA 04/27/2010  . HYPERLIPIDEMIA 04/16/2010  . Essential hypertension, benign 06/02/2007    Past Surgical History:  Procedure Laterality Date  . CERVIX LESION DESTRUCTION  1980'S  . CHOLECYSTECTOMY N/A 02/03/2018   Procedure:  LAPAROSCOPIC CHOLECYSTECTOMY;  Surgeon: Ileana Roup, MD;  Location: [facility];  Service: General;  Laterality: N/A;  . CHOLECYSTECTOMY    . COLONOSCOPY    . ESOPHAGOGASTRODUODENOSCOPY    . OVARY SURGERY     removed precancerous cells with a laser    Prior to Admission medications   Medication Sig Start Date End Date Taking? Authorizing Provider  meloxicam (MOBIC) 15 MG tablet Take 1 tablet (15 mg total) by mouth daily. 05/11/20 05/11/21 Yes Moorea Boissonneault, Linden Dolin, PA-C  traMADol (ULTRAM) 50 MG tablet Take 1 tablet (50 mg total) by mouth every 6 (six) hours as needed. 05/11/20  Yes Versie Starks, PA-C  Cholecalciferol 50 MCG (2000 UT) TABS Take 1 tablet (2,000 Units total) by mouth daily. 10/10/18   Janith Lima, MD  colestipol (COLESTID) 5 g granules Take 5 g by mouth daily with supper. 10/04/19   Gatha Mayer, MD  EDARBI 40 MG TABS TAKE 1 TABLET EACH DAY. 04/07/20   Janith Lima, MD  esomeprazole (NEXIUM) 40 MG capsule TAKE 1 CAPSULE (40 MG TOTAL) BY MOUTH DAILY AT 12 NOON. 04/10/20   Janith Lima, MD  indapamide (LOZOL) 1.25 MG tablet TAKE 1 TABLET (1.25 MG TOTAL) BY MOUTH DAILY. 05/07/20   Janith Lima, MD  nystatin-triamcinolone ointment [facility]) Apply 1 application topically 2 (two) times daily. 07/30/19   Huel Cote, NP    Allergies Doxycycline, Fish allergy, Benzonatate, Penicillins, and Sulfonamide derivatives  Family History  Problem Relation Age of Onset  . Ovarian cancer Sister 55  . Heart disease Sister   .  Early death Sister   . ALS Mother   . Heart disease Father   . Coronary artery disease Other        female first degree <50  . Diabetes Maternal Grandfather   . Diabetes Maternal Grandmother   . Stroke Neg Hx   . Kidney disease Neg Hx   . Hypertension Neg Hx   . Hyperlipidemia Neg Hx   . Osteoporosis Neg Hx     Social History Social History   Tobacco Use  . Smoking status: Former Smoker    Packs/day: 1.00    Types: Cigarettes    Quit date: 04/27/2014     Years since quitting: 6.0  . Smokeless tobacco: Never Used  Vaping Use  . Vaping Use: Never used  Substance Use Topics  . Alcohol use: No    Alcohol/week: 0.0 standard drinks  . Drug use: No    Review of Systems  Constitutional: No fever/chills Eyes: No visual changes. ENT: No sore throat. Respiratory: Denies cough Cardiovascular: Denies chest pain Gastrointestinal: Denies abdominal pain Genitourinary: Negative for dysuria. Musculoskeletal: Negative for back pain. Positive for right knee pain Skin: Negative for rash. Psychiatric: no mood changes,     ____________________________________________   PHYSICAL EXAM:  VITAL SIGNS: ED Triage Vitals  Enc Vitals Group     BP 05/11/20 1307 117/75     Pulse Rate 05/11/20 1307 97     Resp 05/11/20 1307 20     Temp 05/11/20 1307 98.3 F (36.8 C)     Temp Source 05/11/20 1307 Oral     SpO2 05/11/20 1307 96 %     Weight 05/11/20 1302 179 lb 14.3 oz (81.6 kg)     Height 05/11/20 1302 5' (1.524 m)     Head Circumference --      Peak Flow --      Pain Score 05/11/20 1301 6     Pain Loc --      Pain Edu? --      Excl. in [place]? --     Constitutional: Alert and oriented. Well appearing and in no acute distress. Eyes: Conjunctivae are normal.  Head: Atraumatic. Nose: No congestion/rhinnorhea. Mouth/Throat: Mucous membranes are moist.  Neck:  supple no lymphadenopathy noted Cardiovascular: Normal rate, regular rhythm.  Respiratory: Normal respiratory effort.  No retractions GU: deferred Musculoskeletal: FROM all extremities, warm and well perfused, range of motion reproduces discomfort, patella and joint line are very tender to palpation, no bruising is noted, neurovascular is intact, femur and tib-fib are nontender Neurologic:  Normal speech and language.  Skin:  Skin is warm, dry and intact. No rash noted. Psychiatric: Mood and affect are normal. Speech and behavior are normal.  ____________________________________________    LABS (all labs ordered are listed, but only abnormal results are displayed)  Labs Reviewed - No data to display ____________________________________________   ____________________________________________  RADIOLOGY  X-ray of the right knee shows a lucency on the patella  ____________________________________________   PROCEDURES  Procedure(s) performed: Knee immobilizer and walker-nursing staff  Procedures    ____________________________________________   INITIAL IMPRESSION / ASSESSMENT AND PLAN / ED COURSE  Pertinent labs & imaging results that were available during my care of the patient were reviewed by me and considered in my medical decision making (see chart for details).   Patient is 60 year old female presents emergency department after a fall.  See HPI.  Physical exam shows patient's right knee did be very tender along the patella joint line.  X-ray of  the right knee was reviewed by me and confirmed by radiology shows a lucency of the patella.  Due to the tenderness in the same area I do  consider this a fracture.  I did explain all findings to the patient.  She is to follow-up with orthopedics.  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  I feel that she will need to use her short-term disability.  I did explain to her that we do not fill out FMLA or disability forms.  She will need to have this filled out by her regular physician or orthopedics.  She was given a prescription for meloxicam and tramadol.  She is to return to the emergency department for worsening.  She is discharged in stable condition.     Paula Parker was evaluated in Emergency Department on 05/11/2020 for the symptoms described in the history of present illness. She was evaluated in the context of the global COVID-19 pandemic, which necessitated consideration that the patient might be at  risk for infection with the SARS-CoV-2 virus that causes COVID-19. Institutional protocols and algorithms that pertain to the evaluation of patients at risk for COVID-19 are in a state of rapid change based on information released by regulatory bodies including the CDC and federal and state organizations. These policies and algorithms were followed during the patient's care in the ED.    As part of my medical decision making, I reviewed the following data within the [place] notes reviewed and incorporated, Old chart reviewed, Radiograph reviewed , Notes from prior ED visits and [place] Controlled Substance Database  ____________________________________________   FINAL CLINICAL IMPRESSION(S) / ED DIAGNOSES  Final diagnoses:  Closed nondisplaced fracture of right patella, unspecified fracture morphology, initial encounter      NEW MEDICATIONS STARTED DURING THIS VISIT:  New Prescriptions   MELOXICAM (MOBIC) 15 MG TABLET    Take 1 tablet (15 mg total) by mouth daily.   TRAMADOL (ULTRAM) 50 MG TABLET    Take 1 tablet (50 mg total) by mouth every 6 (six) hours as needed.     Note:  This document was prepared using Dragon voice recognition software and may include unintentional dictation errors.    Versie Starks, PA-C 05/11/20 1424    Naaman Plummer, MD 05/11/20 1520

## 2020-05-11 NOTE — ED Triage Notes (Signed)
Pt to ED via POV with c/o R knee pain s/p mechanical fall. Pt states was going up steps walking her dog and her fell.

## 2020-05-11 NOTE — Discharge Instructions (Addendum)
Please seek medical attention for any high fevers, chest pain, shortness of breath, change in behavior, persistent vomiting, bloody stool or any other new or concerning symptoms.  

## 2020-05-11 NOTE — ED Triage Notes (Signed)
Patient states that she was seen here earlier today for a fractured knee. Patient states about 16:00 she too tramadol for pain. Patient states that about an hour ago she felt like her heart was racing and became dizzy. Patient states that she felt like she was going to pass out.

## 2020-05-11 NOTE — ED Notes (Signed)
Pt verbalized understanding of d/c instructions at this time. Pt denies further questions.   This RN visualized pt using proper walker technique at this time.   Pt ambulatory to lobby using walker at this time, NAD noted.

## 2020-05-11 NOTE — ED Notes (Signed)
Pt ambulatory to the bathroom with use of walker

## 2020-05-11 NOTE — ED Notes (Signed)
Pt to ED42A in wheelchair at this time. Pt in NAD. Pt states she was at home, missed the top step of her stairs, and fell on her R knee. Pt states she was able to walk in here today, and bear some weight, but had moderate pain with ambulation. Pt states when bending her knee at all the pain is 10/10.

## 2020-05-11 NOTE — ED Provider Notes (Signed)
[facility] Emergency Department Provider Note   ____________________________________________   I have reviewed the triage vital signs and the nursing notes.   HISTORY  Chief Complaint Near Syncope   History limited by: Not Limited   HPI Paula Parker is a 60 y.o. female who presents to the emergency department today after a near syncopal episode.  The patient was seen in the emergency department earlier today and was diagnosed with a right patellar fracture.  She states that she went home and took one of the tramadol's.  Shortly thereafter she started feeling lightheaded and dizzy.  She tried to get up when she started feeling like her vision was going off.  She did sit back down.  By the time my exam she is feeling somewhat better.  She states that she is had issues with lightheadedness while taking other opioids in the past.  Patient also states that she did not eat or drink her normal amount today because she was in the emergency department for part of it.   Records reviewed. Per medical record review patient has a history of HTN, HLD. Right patellar fracture diagnosed earlier today.  Past Medical History:  Diagnosis Date  . Fatty liver   . Gallstones   . GERD (gastroesophageal reflux disease)   . GLAUCOMA, BORDERLINE   . Hiatal hernia   . HTN (hypertension)   . HYPERLIPIDEMIA   . HYPERTENSION   . PALPITATIONS, OCCASIONAL   . Pseudomembranous colitis   . SMOKER     Patient Active Problem List   Diagnosis Date Noted  . Gastroesophageal reflux disease without esophagitis 08/09/2019  . Nonalcoholic steatohepatitis (NASH) 11/02/2018  . Irritable bowel syndrome with diarrhea 10/10/2018  . Visit for screening mammogram 04/27/2018  . Vitamin D deficiency disease 10/11/2017  . DJD (degenerative joint disease), ankle and foot, left 07/15/2016  . Osteoporosis 12/05/2014  . Right lumbar radiculitis 12/05/2014  . Allergic rhinitis 07/19/2013  . GERD  (gastroesophageal reflux disease) 04/17/2013  . Colon cancer screening 04/17/2013  . Atrophic vaginitis 02/03/2012  . Routine general medical examination at a health care facility 01/24/2012  . Alcoholic fatty liver 37/85/8850  . GLAUCOMA, BORDERLINE 04/27/2010  . HIATAL HERNIA 04/27/2010  . HYPERLIPIDEMIA 04/16/2010  . Essential hypertension, benign 06/02/2007    Past Surgical History:  Procedure Laterality Date  . CERVIX LESION DESTRUCTION  1980'S  . CHOLECYSTECTOMY N/A 02/03/2018   Procedure: LAPAROSCOPIC CHOLECYSTECTOMY;  Surgeon: Ileana Roup, MD;  Location: [facility];  Service: General;  Laterality: N/A;  . CHOLECYSTECTOMY    . COLONOSCOPY    . ESOPHAGOGASTRODUODENOSCOPY    . OVARY SURGERY     removed precancerous cells with a laser    Prior to Admission medications   Medication Sig Start Date End Date Taking? Authorizing Provider  Cholecalciferol 50 MCG (2000 UT) TABS Take 1 tablet (2,000 Units total) by mouth daily. 10/10/18   Janith Lima, MD  colestipol (COLESTID) 5 g granules Take 5 g by mouth daily with supper. 10/04/19   Gatha Mayer, MD  EDARBI 40 MG TABS TAKE 1 TABLET EACH DAY. 04/07/20   Janith Lima, MD  esomeprazole (NEXIUM) 40 MG capsule TAKE 1 CAPSULE (40 MG TOTAL) BY MOUTH DAILY AT 12 NOON. 04/10/20   Janith Lima, MD  indapamide (LOZOL) 1.25 MG tablet TAKE 1 TABLET (1.25 MG TOTAL) BY MOUTH DAILY. 05/07/20   Janith Lima, MD  meloxicam (MOBIC) 15 MG tablet Take 1 tablet (15 mg  total) by mouth daily. 05/11/20 05/11/21  Versie Starks, PA-C  nystatin-triamcinolone ointment [facility]) Apply 1 application topically 2 (two) times daily. 07/30/19   Huel Cote, NP  traMADol (ULTRAM) 50 MG tablet Take 1 tablet (50 mg total) by mouth every 6 (six) hours as needed. 05/11/20   Versie Starks, PA-C    Allergies Doxycycline, Fish allergy, Benzonatate, Penicillins, and Sulfonamide derivatives  Family History  Problem Relation Age of Onset  . Ovarian cancer Sister  18  . Heart disease Sister   . Early death Sister   . ALS Mother   . Heart disease Father   . Coronary artery disease Other        female first degree <50  . Diabetes Maternal Grandfather   . Diabetes Maternal Grandmother   . Stroke Neg Hx   . Kidney disease Neg Hx   . Hypertension Neg Hx   . Hyperlipidemia Neg Hx   . Osteoporosis Neg Hx     Social History Social History   Tobacco Use  . Smoking status: Former Smoker    Packs/day: 1.00    Types: Cigarettes    Quit date: 04/27/2014    Years since quitting: 6.0  . Smokeless tobacco: Never Used  Vaping Use  . Vaping Use: Never used  Substance Use Topics  . Alcohol use: No    Alcohol/week: 0.0 standard drinks  . Drug use: No    Review of Systems Constitutional: No fever/chills Eyes: No visual changes. ENT: No sore throat. Cardiovascular: Denies chest pain. Respiratory: Denies shortness of breath. Gastrointestinal: No abdominal pain.  No nausea, no vomiting.  No diarrhea.   Genitourinary: Negative for dysuria. Musculoskeletal: Positive for right knee pain. Skin: Negative for rash. Neurological: Positive for lightheadedness. ____________________________________________   PHYSICAL EXAM:  VITAL SIGNS: ED Triage Vitals  Enc Vitals Group     BP 05/11/20 1954 114/67     Pulse Rate 05/11/20 1954 (!) 110     Resp 05/11/20 1954 20     Temp 05/11/20 1954 98.3 F (36.8 C)     Temp Source 05/11/20 1954 Oral     SpO2 05/11/20 1954 95 %     Weight 05/11/20 1947 179 lb 14.3 oz (81.6 kg)     Height 05/11/20 1947 5' (1.524 m)     Head Circumference --      Peak Flow --      Pain Score 05/11/20 1947 4   Constitutional: Alert and oriented.  Eyes: Conjunctivae are normal.  ENT      Head: Normocephalic and atraumatic.      Nose: No congestion/rhinnorhea.      Mouth/Throat: Mucous membranes are moist.      Neck: No stridor. Hematological/Lymphatic/Immunilogical: No cervical lymphadenopathy. Cardiovascular: Normal rate,  regular rhythm.  No murmurs, rubs, or gallops.  Respiratory: Normal respiratory effort without tachypnea nor retractions. Breath sounds are clear and equal bilaterally. No wheezes/rales/rhonchi. Gastrointestinal: Soft and non tender. No rebound. No guarding.  Genitourinary: Deferred Musculoskeletal: Right knee in knee immobilizer. Neurologic:  Normal speech and language. No gross focal neurologic deficits are appreciated.  Skin:  Skin is warm, dry and intact. No rash noted. Psychiatric: Mood and affect are normal. Speech and behavior are normal. Patient exhibits appropriate insight and judgment.  ____________________________________________    LABS (pertinent positives/negatives)  CBC wbc 9.6, hgb 13.9, plt 305 BMP wnl except glu 150, bun 22, cr 1.32 UA cloudy, small hgb dipstick, small leukocytes, 6-10 rbc, 11-20 wbc, 11-20 squamous  epitheleal ____________________________________________   EKG  INance Pear, attending physician, personally viewed and interpreted this EKG  EKG Time: 1939 Rate: 100 Rhythm: sinus rhythm Axis: normal Intervals: qtc 430 QRS: narrow ST changes: no st elevation Impression: normal ekg   ____________________________________________    RADIOLOGY  None ____________________________________________   PROCEDURES  Procedures  ____________________________________________   INITIAL IMPRESSION / ASSESSMENT AND PLAN / ED COURSE  Pertinent labs & imaging results that were available during my care of the patient were reviewed by me and considered in my medical decision making (see chart for details).   Presented to the emergency department today after a near syncopal episode.  I do think this was likely a combination of tramadol medication, dehydration and pain.  The time my exam patient was feeling better.  Patient's creatinine is slightly elevated on blood work so will be given some IV fluids.  Discussed this with the patient.  This time I  have low suspicion for ACS.  Low suspicion for PE.  Discussed pain control options with the patient.  ____________________________________________   FINAL CLINICAL IMPRESSION(S) / ED DIAGNOSES  Final diagnoses:  Near syncope  Dehydration     Note: This dictation was prepared with Dragon dictation. Any transcriptional errors that result from this process are unintentional     Nance Pear, MD 05/11/20 2244

## 2020-05-12 ENCOUNTER — Telehealth: Payer: Self-pay | Admitting: Internal Medicine

## 2020-05-12 ENCOUNTER — Other Ambulatory Visit: Payer: Self-pay | Admitting: Internal Medicine

## 2020-05-12 DIAGNOSIS — S82001D Unspecified fracture of right patella, subsequent encounter for closed fracture with routine healing: Secondary | ICD-10-CM | POA: Insufficient documentation

## 2020-05-12 NOTE — Telephone Encounter (Signed)
Patient called and was wondering if a referral could be placed for an orthopedic surgeon. She said she fell and fractured her right knee. She went to the ED on 05/11/20. Please advise.

## 2020-05-13 ENCOUNTER — Ambulatory Visit (INDEPENDENT_AMBULATORY_CARE_PROVIDER_SITE_OTHER): Payer: No Typology Code available for payment source | Admitting: Orthopaedic Surgery

## 2020-05-13 ENCOUNTER — Encounter: Payer: Self-pay | Admitting: Orthopaedic Surgery

## 2020-05-13 DIAGNOSIS — S82001D Unspecified fracture of right patella, subsequent encounter for closed fracture with routine healing: Secondary | ICD-10-CM | POA: Diagnosis not present

## 2020-05-13 LAB — URINE CULTURE

## 2020-05-13 NOTE — Progress Notes (Signed)
Office Visit Note   Patient: Paula Parker           Date of Birth: 05/05/1960           MRN: [id number] Visit Date: 05/13/2020              Requested by: Janith Lima, MD [address] [place],  [place] [address] PCP: Janith Lima, MD   Assessment & Plan: Visit Diagnoses:  1. Closed nondisplaced fracture of right patella with routine healing, unspecified fracture morphology, subsequent encounter     Plan: She is weightbearing as tolerated in the knee immobilizer.  She can take the knee immobilizer off for hygiene purposes only.  She still avoid bending the knee.  Wiggling toes elevation encouraged.  Questions encouraged and answered.  We will see her back in 1 month and obtain a lateral view of her right knee.  She is placed out of work until follow-up in 1 month we will reevaluate her work status at that time.  Follow-Up Instructions: Return in about 4 weeks (around 06/10/2020) for Radiographs.   Orders:  No orders of the defined types were placed in this encounter.  No orders of the defined types were placed in this encounter.     Procedures: No procedures performed   Clinical Data: No additional findings.   Subjective: Chief Complaint  Patient presents with  . Right Knee - Pain    HPI Paula Parker is a pleasant 60 year old female who unfortunately had a fall while taking her dog out on 05/11/2020.  She was seen later that day in the ER where she was found to have a nondisplaced patella fracture right knee.  Reviewed the films that shows a nondisplaced inferior pole patella fracture.  No other fractures identified.  No significant arthritic changes. She was placed appropriately in the knee immobilizer.  She is given tramadol for pain however this made her lightheaded therefore she is taking Tylenol.  Patient does work for [employer] in housekeeping. Review of Systems Denies any other injuries outside the patella fracture.  Please see HPI otherwise  negative or noncontributory.  Objective: Vital Signs: There were no vitals taken for this visit.  Physical Exam Constitutional:      Appearance: She is not ill-appearing or diaphoretic.  Pulmonary:     Effort: Pulmonary effort is normal.  Neurological:     Mental Status: She is alert and oriented to person, place, and time.  Psychiatric:        Mood and Affect: Mood normal.     Ortho Exam Right knee no abnormal warmth erythema or effusion.  No ecchymosis.  No instability valgus varus stressing.  Right calf supple nontender. Specialty Comments:  No specialty comments available.  Imaging: No results found.   PMFS History: Patient Active Problem List   Diagnosis Date Noted  . Closed nondisplaced fracture of right patella with routine healing 05/12/2020  . Gastroesophageal reflux disease without esophagitis 08/09/2019  . Nonalcoholic steatohepatitis (NASH) 11/02/2018  . Irritable bowel syndrome with diarrhea 10/10/2018  . Visit for screening mammogram 04/27/2018  . Vitamin D deficiency disease 10/11/2017  . DJD (degenerative joint disease), ankle and foot, left 07/15/2016  . Osteoporosis 12/05/2014  . Right lumbar radiculitis 12/05/2014  . Allergic rhinitis 07/19/2013  . GERD (gastroesophageal reflux disease) 04/17/2013  . Colon cancer screening 04/17/2013  . Atrophic vaginitis 02/03/2012  . Routine general medical examination at a health care facility 01/24/2012  . Alcoholic fatty liver  04/28/2010  . GLAUCOMA, BORDERLINE 04/27/2010  . HIATAL HERNIA 04/27/2010  . HYPERLIPIDEMIA 04/16/2010  . Essential hypertension, benign 06/02/2007   Past Medical History:  Diagnosis Date  . Fatty liver   . Gallstones   . GERD (gastroesophageal reflux disease)   . GLAUCOMA, BORDERLINE   . Hiatal hernia   . HTN (hypertension)   . HYPERLIPIDEMIA   . HYPERTENSION   . PALPITATIONS, OCCASIONAL   . Pseudomembranous colitis   . SMOKER     Family History  Problem Relation Age of  Onset  . Ovarian cancer Sister 74  . Heart disease Sister   . Early death Sister   . ALS Mother   . Heart disease Father   . Coronary artery disease Other        female first degree <50  . Diabetes Maternal Grandfather   . Diabetes Maternal Grandmother   . Stroke Neg Hx   . Kidney disease Neg Hx   . Hypertension Neg Hx   . Hyperlipidemia Neg Hx   . Osteoporosis Neg Hx     Past Surgical History:  Procedure Laterality Date  . CERVIX LESION DESTRUCTION  1980'S  . CHOLECYSTECTOMY N/A 02/03/2018   Procedure: LAPAROSCOPIC CHOLECYSTECTOMY;  Surgeon: Ileana Roup, MD;  Location: [facility];  Service: General;  Laterality: N/A;  . CHOLECYSTECTOMY    . COLONOSCOPY    . ESOPHAGOGASTRODUODENOSCOPY    . OVARY SURGERY     removed precancerous cells with a laser   Social History   Occupational History    Employer: [employer]  Tobacco Use  . Smoking status: Former Smoker    Packs/day: 1.00    Types: Cigarettes    Quit date: 04/27/2014    Years since quitting: 6.0  . Smokeless tobacco: Never Used  Vaping Use  . Vaping Use: Never used  Substance and Sexual Activity  . Alcohol use: No    Alcohol/week: 0.0 standard drinks  . Drug use: No  . Sexual activity: Not Currently

## 2020-05-14 ENCOUNTER — Telehealth: Payer: Self-pay | Admitting: Orthopaedic Surgery

## 2020-05-14 ENCOUNTER — Telehealth: Payer: Self-pay | Admitting: Physician Assistant

## 2020-05-14 NOTE — Telephone Encounter (Signed)
Patient submitted medical release form for FMLA, medical release form for short term disability, $25.00 payment to [employer] for short term another $25 payment to FMLA to [employer] in total $50.00 in cash. Accepted 05/14/2020. Paula Parker has received [employer] paperwork. Still waiting for [employer].

## 2020-05-14 NOTE — Telephone Encounter (Signed)
[employer] forms received. Sent to [employer].

## 2020-05-14 NOTE — Telephone Encounter (Signed)
[place] forms received. Sent to [employer] 

## 2020-05-15 ENCOUNTER — Encounter: Payer: Self-pay | Admitting: Internal Medicine

## 2020-05-15 ENCOUNTER — Other Ambulatory Visit: Payer: Self-pay

## 2020-05-15 ENCOUNTER — Ambulatory Visit (INDEPENDENT_AMBULATORY_CARE_PROVIDER_SITE_OTHER): Payer: No Typology Code available for payment source | Admitting: Internal Medicine

## 2020-05-15 VITALS — BP 118/84 | HR 93 | Temp 98.3°F | Ht 60.0 in | Wt 184.0 lb

## 2020-05-15 DIAGNOSIS — E559 Vitamin D deficiency, unspecified: Secondary | ICD-10-CM

## 2020-05-15 DIAGNOSIS — Z0000 Encounter for general adult medical examination without abnormal findings: Secondary | ICD-10-CM | POA: Diagnosis not present

## 2020-05-15 DIAGNOSIS — N1832 Chronic kidney disease, stage 3b: Secondary | ICD-10-CM

## 2020-05-15 DIAGNOSIS — R739 Hyperglycemia, unspecified: Secondary | ICD-10-CM

## 2020-05-15 DIAGNOSIS — M8000XA Age-related osteoporosis with current pathological fracture, unspecified site, initial encounter for fracture: Secondary | ICD-10-CM | POA: Diagnosis not present

## 2020-05-15 DIAGNOSIS — I10 Essential (primary) hypertension: Secondary | ICD-10-CM | POA: Diagnosis not present

## 2020-05-15 DIAGNOSIS — R7303 Prediabetes: Secondary | ICD-10-CM | POA: Insufficient documentation

## 2020-05-15 LAB — LIPID PANEL
Cholesterol: 175 mg/dL (ref 0–200)
HDL: 58.6 mg/dL (ref >39.00)
LDL Cholesterol: 94 mg/dL (ref 0–99)
NonHDL: 116.78
Total CHOL/HDL Ratio: 3
Triglycerides: 115 mg/dL (ref 0.0–149.0)
VLDL: 23 mg/dL (ref 0.0–40.0)

## 2020-05-15 LAB — URINALYSIS, ROUTINE W REFLEX MICROSCOPIC
Bilirubin Urine: NEGATIVE
Hgb urine dipstick: NEGATIVE
Ketones, ur: NEGATIVE
Leukocytes,Ua: NEGATIVE
Nitrite: NEGATIVE
RBC / HPF: NONE SEEN (ref 0–?)
Specific Gravity, Urine: 1.01 (ref 1.000–1.030)
Total Protein, Urine: NEGATIVE
Urine Glucose: NEGATIVE
Urobilinogen, UA: 0.2 (ref 0.0–1.0)
pH: 6 (ref 5.0–8.0)

## 2020-05-15 LAB — VITAMIN D 25 HYDROXY (VIT D DEFICIENCY, FRACTURES): VITD: 36.76 ng/mL (ref 30.00–100.00)

## 2020-05-15 NOTE — Progress Notes (Signed)
Subjective:  Patient ID: Paula Parker, female    DOB: 1961/02/04  Age: 60 y.o. MRN: [id number]  CC: Hypertension and Annual Exam  This visit occurred during the SARS-CoV-2 public health emergency.  Safety protocols were in place, including screening questions prior to the visit, additional usage of staff PPE, and extensive cleaning of exam room while observing appropriate contact time as indicated for disinfecting solutions.    HPI Paula Parker presents for a CPX and f/up -  She is being treated by ortho for a recent right patella fracture - she was recently prescribed tramadol and an nsaid and did not tolerate this well as she developed lightheadedness and dizziness. She tells me that her BP has been very well controlled.  Outpatient Medications Prior to Visit  Medication Sig Dispense Refill  . Cholecalciferol 50 MCG (2000 UT) TABS Take 1 tablet (2,000 Units total) by mouth daily. 90 tablet 1  . colestipol (COLESTID) 5 g granules Take 5 g by mouth daily with supper. 300 g 0  . EDARBI 40 MG TABS TAKE 1 TABLET EACH DAY. 90 tablet 1  . esomeprazole (NEXIUM) 40 MG capsule TAKE 1 CAPSULE (40 MG TOTAL) BY MOUTH DAILY AT 12 NOON. 90 capsule 1  . indapamide (LOZOL) 1.25 MG tablet TAKE 1 TABLET (1.25 MG TOTAL) BY MOUTH DAILY. 90 tablet 0  . meloxicam (MOBIC) 15 MG tablet Take 1 tablet (15 mg total) by mouth daily. 30 tablet 2  . nystatin-triamcinolone ointment (MYCOLOG) Apply 1 application topically 2 (two) times daily. 30 g 0  . traMADol (ULTRAM) 50 MG tablet Take 1 tablet (50 mg total) by mouth every 6 (six) hours as needed. 15 tablet 0   No facility-administered medications prior to visit.    ROS Review of Systems  Constitutional: Positive for unexpected weight change (wt gain). Negative for chills, diaphoresis, fatigue and fever.  Eyes: Negative.   Respiratory: Negative for cough, chest tightness, shortness of breath and wheezing.   Cardiovascular: Negative for chest pain,  palpitations and leg swelling.  Gastrointestinal: Negative for abdominal pain, constipation, diarrhea, nausea and vomiting.  Endocrine: Negative.   Genitourinary: Negative.  Negative for difficulty urinating.  Musculoskeletal: Positive for arthralgias.  Skin: Negative.  Negative for color change.  Neurological: Positive for dizziness and light-headedness. Negative for weakness.  Hematological: Negative for adenopathy. Does not bruise/bleed easily.  Psychiatric/Behavioral: Negative.     Objective:  BP 118/84   Pulse 93   Temp 98.3 F (36.8 C) (Oral)   Ht 5' (1.524 m)   Wt 184 lb (83.5 kg)   SpO2 97%   BMI 35.94 kg/m   BP Readings from Last 3 Encounters:  05/15/20 118/84  05/11/20 114/67  05/11/20 117/75    Wt Readings from Last 3 Encounters:  05/15/20 184 lb (83.5 kg)  05/11/20 179 lb 14.3 oz (81.6 kg)  05/11/20 179 lb 14.3 oz (81.6 kg)    Physical Exam Vitals reviewed.  HENT:     Nose: Nose normal.     Mouth/Throat:     Mouth: Mucous membranes are moist.  Eyes:     General: No scleral icterus.    Conjunctiva/sclera: Conjunctivae normal.  Cardiovascular:     Rate and Rhythm: Normal rate and regular rhythm.     Pulses: Normal pulses.  Pulmonary:     Effort: Pulmonary effort is normal.     Breath sounds: No stridor. No wheezing, rhonchi or rales.  Abdominal:     General: Abdomen is flat.  Palpations: There is no mass.     Tenderness: There is no abdominal tenderness. There is no guarding.     Hernia: No hernia is present.  Musculoskeletal:        General: Normal range of motion.     Cervical back: Neck supple.     Right lower leg: No edema.     Left lower leg: No edema.  Lymphadenopathy:     Cervical: No cervical adenopathy.  Skin:    General: Skin is warm and dry.  Neurological:     General: No focal deficit present.     Mental Status: She is alert.  Psychiatric:        Mood and Affect: Mood normal.        Behavior: Behavior normal.     Lab  Results  Component Value Date   WBC 9.6 05/11/2020   HGB 13.9 05/11/2020   HCT 41.3 05/11/2020   PLT 305 05/11/2020   GLUCOSE 90 05/16/2020   CHOL 175 05/15/2020   TRIG 115.0 05/15/2020   HDL 58.60 05/15/2020   LDLCALC 94 05/15/2020   ALT 28 02/12/2020   AST 25 02/12/2020   NA 140 05/16/2020   K 4.0 05/16/2020   CL 103 05/16/2020   CREATININE 1.14 05/16/2020   BUN 21 05/16/2020   CO2 30 05/16/2020   TSH 2.16 02/12/2020   HGBA1C 6.1 05/16/2020    DG Knee Complete 4 Views Right  Result Date: 05/11/2020 CLINICAL DATA:  Post fall, now with right knee pain. EXAM: RIGHT KNEE - COMPLETE 4+ VIEW COMPARISON:  None. FINDINGS: There is a serpiginous lucency involving the inferior pole of the patella which may represent a nondisplaced fracture. No associated joint effusion or evidence of lipohemarthrosis. Joint spaces appear preserved. No evidence of chondrocalcinosis. Regional soft tissues appear normal. No radiopaque body. IMPRESSION: 1. Serpiginous lucency involving the inferior pole of the patella may represent a nondisplaced fracture. Correlation for point tenderness at this location is recommended. 2. Otherwise, no acute findings. Electronically Signed   By: Sandi Mariscal M.D.   On: 05/11/2020 13:37    Assessment & Plan:   Paula Parker was seen today for hypertension and annual exam.  Diagnoses and all orders for this visit:  Age-related osteoporosis with current pathological fracture, initial encounter- I recommended she treat this with an anabolic agent. -     VITAMIN D 25 Hydroxy (Vit-D Deficiency, Fractures); Future -     VITAMIN D 25 Hydroxy (Vit-D Deficiency, Fractures) -     Magnesium; Future -     PTH, intact and calcium; Future -     Romosozumab-aqqg (EVENITY) 105 MG/1.17ML SOSY injection; Inject 210 mg into the skin every 30 (thirty) days.  Essential hypertension, benign- Her blood pressure is overcontrolled and she has a prerenal azotemia.  I recommended that she stop taking the  thiazide diuretic. -     Urinalysis, Routine w reflex microscopic; Future -     Urinalysis, Routine w reflex microscopic -     Basic metabolic panel; Future  Routine general medical examination at a health care facility- Exam completed, labs reviewed, vaccines reviewed and updated, cancer screenings are up-to-date, patient education was given. -     Lipid panel; Future -     Lipid panel  Vitamin D deficiency disease- Her vitamin D level is normal now. -     VITAMIN D 25 Hydroxy (Vit-D Deficiency, Fractures); Future -     VITAMIN D 25 Hydroxy (Vit-D Deficiency, Fractures)  Hyperglycemia- Her A1c is at 6.1%.  She is prediabetic.  Medical therapy is not yet indicated. -     Hemoglobin A1c; Future -     Basic metabolic panel; Future  Chronic renal impairment, stage 3b ([place])- Will discontinue the diuretic and she will avoid NSAIDs. -     Urinalysis, Routine w reflex microscopic; Future -     Urinalysis, Routine w reflex microscopic   I have discontinued Connye Burkitt. [place]'s nystatin-triamcinolone ointment, indapamide, meloxicam, and traMADol. I am also having her start on Evenity. Additionally, I am having her maintain her Cholecalciferol, colestipol, Edarbi, and esomeprazole.  Meds ordered this encounter  Medications  . Romosozumab-aqqg (EVENITY) 105 MG/1.17ML SOSY injection    Sig: Inject 210 mg into the skin every 30 (thirty) days.    Dispense:  2.4 mL    Refill:  11     Follow-up: Return in about 3 months (around 08/12/2020).  Scarlette Calico, MD

## 2020-05-15 NOTE — Patient Instructions (Signed)
Health Maintenance, Female Adopting a healthy lifestyle and getting preventive care are important in promoting health and wellness. Ask your health care provider about:  The right schedule for you to have regular tests and exams.  Things you can do on your own to prevent diseases and keep yourself healthy. What should I know about diet, weight, and exercise? Eat a healthy diet  Eat a diet that includes plenty of vegetables, fruits, low-fat dairy products, and lean protein.  Do not eat a lot of foods that are high in solid fats, added sugars, or sodium.   Maintain a healthy weight Body mass index (BMI) is used to identify weight problems. It estimates body fat based on height and weight. Your health care provider can help determine your BMI and help you achieve or maintain a healthy weight. Get regular exercise Get regular exercise. This is one of the most important things you can do for your health. Most adults should:  Exercise for at least 150 minutes each week. The exercise should increase your heart rate and make you sweat (moderate-intensity exercise).  Do strengthening exercises at least twice a week. This is in addition to the moderate-intensity exercise.  Spend less time sitting. Even light physical activity can be beneficial. Watch cholesterol and blood lipids Have your blood tested for lipids and cholesterol at 59 years of age, then have this test every 5 years. Have your cholesterol levels checked more often if:  Your lipid or cholesterol levels are high.  You are older than 59 years of age.  You are at high risk for heart disease. What should I know about cancer screening? Depending on your health history and family history, you may need to have cancer screening at various ages. This may include screening for:  Breast cancer.  Cervical cancer.  Colorectal cancer.  Skin cancer.  Lung cancer. What should I know about heart disease, diabetes, and high blood  pressure? Blood pressure and heart disease  High blood pressure causes heart disease and increases the risk of stroke. This is more likely to develop in people who have high blood pressure readings, are of African descent, or are overweight.  Have your blood pressure checked: ? Every 3-5 years if you are 18-39 years of age. ? Every year if you are 40 years old or older. Diabetes Have regular diabetes screenings. This checks your fasting blood sugar level. Have the screening done:  Once every three years after age 40 if you are at a normal weight and have a low risk for diabetes.  More often and at a younger age if you are overweight or have a high risk for diabetes. What should I know about preventing infection? Hepatitis B If you have a higher risk for hepatitis B, you should be screened for this virus. Talk with your health care provider to find out if you are at risk for hepatitis B infection. Hepatitis C Testing is recommended for:  Everyone born from 1945 through 1965.  Anyone with known risk factors for hepatitis C. Sexually transmitted infections (STIs)  Get screened for STIs, including gonorrhea and chlamydia, if: ? You are sexually active and are younger than 59 years of age. ? You are older than 59 years of age and your health care provider tells you that you are at risk for this type of infection. ? Your sexual activity has changed since you were last screened, and you are at increased risk for chlamydia or gonorrhea. Ask your health care provider   if you are at risk.  Ask your health care provider about whether you are at high risk for HIV. Your health care provider may recommend a prescription medicine to help prevent HIV infection. If you choose to take medicine to prevent HIV, you should first get tested for HIV. You should then be tested every 3 months for as long as you are taking the medicine. Pregnancy  If you are about to stop having your period (premenopausal) and  you may become pregnant, seek counseling before you get pregnant.  Take 400 to 800 micrograms (mcg) of folic acid every day if you become pregnant.  Ask for birth control (contraception) if you want to prevent pregnancy. Osteoporosis and menopause Osteoporosis is a disease in which the bones lose minerals and strength with aging. This can result in bone fractures. If you are 65 years old or older, or if you are at risk for osteoporosis and fractures, ask your health care provider if you should:  Be screened for bone loss.  Take a calcium or vitamin D supplement to lower your risk of fractures.  Be given hormone replacement therapy (HRT) to treat symptoms of menopause. Follow these instructions at home: Lifestyle  Do not use any products that contain nicotine or tobacco, such as cigarettes, e-cigarettes, and chewing tobacco. If you need help quitting, ask your health care provider.  Do not use street drugs.  Do not share needles.  Ask your health care provider for help if you need support or information about quitting drugs. Alcohol use  Do not drink alcohol if: ? Your health care provider tells you not to drink. ? You are pregnant, may be pregnant, or are planning to become pregnant.  If you drink alcohol: ? Limit how much you use to 0-1 drink a day. ? Limit intake if you are breastfeeding.  Be aware of how much alcohol is in your drink. In the [place] one drink equals one 12 oz bottle of beer (355 mL), one 5 oz glass of wine (148 mL), or one 1 oz glass of hard liquor (44 mL). General instructions  Schedule regular health, dental, and eye exams.  Stay current with your vaccines.  Tell your health care provider if: ? You often feel depressed. ? You have ever been abused or do not feel safe at home. Summary  Adopting a healthy lifestyle and getting preventive care are important in promoting health and wellness.  Follow your health care provider's instructions about healthy  diet, exercising, and getting tested or screened for diseases.  Follow your health care provider's instructions on monitoring your cholesterol and blood pressure. This information is not intended to replace advice given to you by your health care provider. Make sure you discuss any questions you have with your health care provider. Document Revised: 03/15/2018 Document Reviewed: 03/15/2018 Elsevier Patient Education  2021 Elsevier Inc.  

## 2020-05-16 ENCOUNTER — Other Ambulatory Visit (INDEPENDENT_AMBULATORY_CARE_PROVIDER_SITE_OTHER): Payer: No Typology Code available for payment source

## 2020-05-16 ENCOUNTER — Other Ambulatory Visit: Payer: Self-pay | Admitting: Internal Medicine

## 2020-05-16 DIAGNOSIS — R739 Hyperglycemia, unspecified: Secondary | ICD-10-CM | POA: Diagnosis not present

## 2020-05-16 DIAGNOSIS — I10 Essential (primary) hypertension: Secondary | ICD-10-CM | POA: Diagnosis not present

## 2020-05-16 LAB — HEMOGLOBIN A1C: Hgb A1c MFr Bld: 6.1 % (ref 4.6–6.5)

## 2020-05-16 LAB — BASIC METABOLIC PANEL
BUN: 21 mg/dL (ref 6–23)
CO2: 30 mEq/L (ref 19–32)
Calcium: 9.4 mg/dL (ref 8.4–10.5)
Chloride: 103 mEq/L (ref 96–112)
Creatinine, Ser: 1.14 mg/dL (ref 0.40–1.20)
GFR: 53 mL/min — ABNORMAL LOW (ref >60.00)
Glucose, Bld: 90 mg/dL (ref 70–99)
Potassium: 4 mEq/L (ref 3.5–5.1)
Sodium: 140 mEq/L (ref 135–145)

## 2020-05-16 MED ORDER — EVENITY 105 MG/1.17ML ~~LOC~~ SOSY
210.0000 mg | PREFILLED_SYRINGE | SUBCUTANEOUS | 11 refills | Status: DC
Start: 2020-05-16 — End: 2020-06-11

## 2020-05-19 ENCOUNTER — Telehealth: Payer: Self-pay

## 2020-05-19 NOTE — Telephone Encounter (Signed)
Key: [employer]

## 2020-05-19 NOTE — Telephone Encounter (Signed)
Disability form received from [employer] and sent to [employer]

## 2020-05-20 ENCOUNTER — Telehealth: Payer: Self-pay | Admitting: Orthopaedic Surgery

## 2020-05-20 NOTE — Telephone Encounter (Signed)
Patient called requesting a call back from [facility]. For short term, FMLA disability forms have been filled out by Dr. Ninfa Linden and filled out and mailed back to [employer]. Please call patient with updates at [phone].

## 2020-05-22 ENCOUNTER — Telehealth (INDEPENDENT_AMBULATORY_CARE_PROVIDER_SITE_OTHER): Payer: No Typology Code available for payment source | Admitting: Family Medicine

## 2020-05-22 ENCOUNTER — Other Ambulatory Visit: Payer: Self-pay

## 2020-05-22 DIAGNOSIS — J111 Influenza due to unidentified influenza virus with other respiratory manifestations: Secondary | ICD-10-CM | POA: Diagnosis not present

## 2020-05-22 NOTE — Telephone Encounter (Signed)
Approved  05/21/2020 to 05/20/2021

## 2020-05-22 NOTE — Patient Instructions (Signed)
  HOME CARE TIPS:  -[place] testing information: https://www.rivera-powers.org/ OR [id number] Most pharmacies also offer testing and home test kits.   -If you have a positive Covid test, please schedule follow-up video visit through your primary care office or through [facility] health promptly if you are interested in treatment.  Most treatments need to be given within the first 5 to 7 days of symptoms.  -can use tylenol if needed for fevers, aches and pains per instructions  -can use nasal saline a few times per day if you have nasal congestion  -stay hydrated, drink plenty of fluids and eat small healthy meals - avoid dairy  -can take 1000 IU (54mg) Vit D3 and 100-500 mg of Vit C daily per instructions  -If the Covid test is positive, check out the CDC website for more information on home care, transmission and treatment for COVID19  -follow up with your doctor in 2-3 days unless improving and feeling better  -stay home while sick, except to seek medical care, and if you have [place] it would be best to stay home for a full 10 days since the onset of symptoms PLUS one day of no fever and feeling better. Wear a good mask (such as N95 or KN95) if around others to reduce the risk of transmission.  It was nice to meet you today, and I really hope you are feeling better soon. I help [facility] out with telemedicine visits on Tuesdays and Thursdays and am available for visits on those days. If you have any concerns or questions following this visit please schedule a follow up visit with your Primary Care doctor or seek care at a local urgent care clinic to avoid delays in care.    Seek in person care or schedule a follow up video visit promptly if your symptoms worsen, new concerns arise or you are not improving with treatment. Call 911 and/or seek emergency care if your symptoms are severe or life threatening.

## 2020-05-22 NOTE — Progress Notes (Signed)
Virtual Visit via Telephone Note  I connected with Paula Parker on 05/22/20 at  6:00 PM EST by telephone and verified that I am speaking with the correct person using two identifiers.   I discussed the limitations, risks, security and privacy concerns of performing an evaluation and management service by telephone and the availability of in person appointments. I also discussed with the patient that there may be a patient responsible charge related to this service. The patient expressed understanding and agreed to proceed.  Location patient: home, [place] Location provider: work or home office Participants present for the call: patient, provider Patient did not have a visit with me in the prior 7 days to address this/these issue(s).   History of Present Illness:  Acute telemedicine visit for flu like symptoms: -Onset: 04/19/20 -Symptoms include: headache, cough, scratchy throat, fever, headache, chills, nasal congestion, sneezing, pnd, diarrhea the first day, some body aches, loss of taste -Denies: CP, SOB, inability to eat/drink/get able -Pertinent past medical history:HTN, overweight, ? hx kidney dz -she reports was only during an acute hospitalization for an orthopedic injury -Pertinent medication allergies: Tessalon, fish allergy, penicillin, sulfa, doxycycline -COVID-19 vaccine status: has had 2 doses of pfizer; had flu shot   Observations/Objective: Patient sounds cheerful and well on the phone. I do not appreciate any SOB. Speech and thought processing are grossly intact. Patient reported vitals:  Assessment and Plan:  Influenza-like illness  -we discussed possible serious and likely etiologies, options for evaluation and workup, limitations of telemedicine visit vs in person visit, treatment, treatment risks and precautions. Pt prefers to treat via telemedicine empirically rather than in person at this moment.  Query influenza, COVID-19 versus other.  She prefers to avoid  Tamiflu.  Discussed options for testing for COVID-19 and she plans to check into this this evening.  In case of positive Covid test, discussed treatment options, potential complications, isolation and precautions.  Advised prompt follow-up through her PCP office or [place] if has a positive test and wishes to pursue treatment referral.  In the interim, Tylenol and safe dosing amounts discussed if needed, nasal saline, other symptomatic care measures summarized in patient instructions. Scheduled follow up with PCP offered: Agrees to contact PCP office for follow-up as needed. Advised to seek prompt in person care if worsening, new symptoms arise, or if is not improving with treatment. Advised of options for inperson care in case PCP office not available. Did let the patient know that I only do telemedicine shifts for [place] on Tuesdays and Thursdays and advised a follow up visit with PCP or at an [facility] if has further questions or concerns.   Follow Up Instructions:  I did not refer this patient for an OV with me in the next 24 hours for this/these issue(s).  I discussed the assessment and treatment plan with the patient. The patient was provided an opportunity to ask questions and all were answered. The patient agreed with the plan and demonstrated an understanding of the instructions.   I spent 17 minutes on the date of this visit in the care of this patient. See summary of tasks completed to properly care for this patient in the detailed notes above which also included counseling of above, review of PMH, medications, allergies, evaluation of the patient and ordering and/or  instructing patient on testing and care options.     Lucretia Kern, DO

## 2020-05-23 ENCOUNTER — Other Ambulatory Visit: Payer: No Typology Code available for payment source

## 2020-05-23 DIAGNOSIS — Z20822 Contact with and (suspected) exposure to covid-19: Secondary | ICD-10-CM

## 2020-05-24 LAB — NOVEL CORONAVIRUS, NAA: SARS-CoV-2, NAA: DETECTED — AB

## 2020-05-24 LAB — SARS-COV-2, NAA 2 DAY TAT

## 2020-05-25 ENCOUNTER — Telehealth: Payer: Self-pay | Admitting: Physician Assistant

## 2020-05-25 ENCOUNTER — Telehealth: Payer: Self-pay | Admitting: Unknown Physician Specialty

## 2020-05-25 NOTE — Telephone Encounter (Signed)
Called to discuss with patient about Covid symptoms and the use of a monoclonal antibody infusion for those with mild to moderate Covid symptoms and at a high risk of hospitalization.   Pt is qualified for the monoclonal antibody infusion, but will let us know if interested. Currently symptoms improving.     [place], [place]  05/25/2020 11:47 AM

## 2020-05-25 NOTE — Telephone Encounter (Signed)
Called to discuss with patient about COVID-19 symptoms and the use of one of the available treatments for those with mild to moderate Covid symptoms and at a high risk of hospitalization.  Pt appears to qualify for outpatient treatment due to co-morbid conditions and/or a member of an at-risk group in accordance with the FDA Emergency Use Authorization.    Symptom onset: 2/15z/ Vaccinated: yes Booster? no Immunocompromised? no Qualifiers: htn, obesity  Unable to reach pt - LMOM and mychart  [employer]

## 2020-05-27 ENCOUNTER — Other Ambulatory Visit: Payer: Self-pay | Admitting: Internal Medicine

## 2020-05-27 ENCOUNTER — Encounter: Payer: Self-pay | Admitting: Internal Medicine

## 2020-05-27 DIAGNOSIS — N1832 Chronic kidney disease, stage 3b: Secondary | ICD-10-CM

## 2020-05-27 DIAGNOSIS — I10 Essential (primary) hypertension: Secondary | ICD-10-CM

## 2020-05-27 NOTE — Telephone Encounter (Signed)
Patient is requesting that the lab orders be placed.  She also tested positive for Covid 19 on 2.18.2022. Please advise.

## 2020-05-28 ENCOUNTER — Other Ambulatory Visit (INDEPENDENT_AMBULATORY_CARE_PROVIDER_SITE_OTHER): Payer: No Typology Code available for payment source

## 2020-05-28 DIAGNOSIS — N2889 Other specified disorders of kidney and ureter: Secondary | ICD-10-CM

## 2020-05-28 DIAGNOSIS — I10 Essential (primary) hypertension: Secondary | ICD-10-CM | POA: Diagnosis not present

## 2020-05-28 DIAGNOSIS — M8000XA Age-related osteoporosis with current pathological fracture, unspecified site, initial encounter for fracture: Secondary | ICD-10-CM | POA: Diagnosis not present

## 2020-05-28 DIAGNOSIS — N1832 Chronic kidney disease, stage 3b: Secondary | ICD-10-CM

## 2020-05-28 LAB — BASIC METABOLIC PANEL
BUN: 16 mg/dL (ref 6–23)
CO2: 30 mEq/L (ref 19–32)
Calcium: 9.1 mg/dL (ref 8.4–10.5)
Chloride: 105 mEq/L (ref 96–112)
Creatinine, Ser: 0.98 mg/dL (ref 0.40–1.20)
GFR: 63.54 mL/min (ref >60.00)
Glucose, Bld: 89 mg/dL (ref 70–99)
Potassium: 3.9 mEq/L (ref 3.5–5.1)
Sodium: 140 mEq/L (ref 135–145)

## 2020-05-28 LAB — MAGNESIUM: Magnesium: 1.9 mg/dL (ref 1.5–2.5)

## 2020-05-30 LAB — PTH, INTACT AND CALCIUM
Calcium: 9.2 mg/dL (ref 8.6–10.4)
PTH: 21 pg/mL (ref 14–64)

## 2020-06-05 ENCOUNTER — Telehealth: Payer: Self-pay | Admitting: Orthopaedic Surgery

## 2020-06-05 NOTE — Telephone Encounter (Signed)
Her knee is going to be stiff because of having to wear the knee immobilizer.  However, she has wear the knee immobilizer because she has a patella fracture that we need to get to heal.  We will reevaluate this at her next visit and hopefully can by then start getting rid of the knee immobilizer.

## 2020-06-05 NOTE — Telephone Encounter (Signed)
Pt called stating she has been wearing a brace oh her leg for a couple weeks and whenever she takes it off her leg feels really stiff. Pt would like a CB to be advised if this is normal and if there's anything she can do to lessen this?  [phone]

## 2020-06-05 NOTE — Telephone Encounter (Signed)
Patient aware of the below message  

## 2020-06-09 ENCOUNTER — Encounter: Payer: Self-pay | Admitting: Emergency Medicine

## 2020-06-09 ENCOUNTER — Emergency Department
Admission: EM | Admit: 2020-06-09 | Discharge: 2020-06-09 | Disposition: A | Discharge status: Home / Self Care | Payer: No Typology Code available for payment source | Attending: Emergency Medicine | Admitting: Emergency Medicine

## 2020-06-09 ENCOUNTER — Other Ambulatory Visit: Payer: Self-pay

## 2020-06-09 ENCOUNTER — Telehealth: Payer: Self-pay | Admitting: Internal Medicine

## 2020-06-09 ENCOUNTER — Emergency Department: Payer: No Typology Code available for payment source

## 2020-06-09 DIAGNOSIS — Z87891 Personal history of nicotine dependence: Secondary | ICD-10-CM | POA: Insufficient documentation

## 2020-06-09 DIAGNOSIS — I10 Essential (primary) hypertension: Secondary | ICD-10-CM | POA: Diagnosis not present

## 2020-06-09 DIAGNOSIS — M79661 Pain in right lower leg: Secondary | ICD-10-CM | POA: Diagnosis present

## 2020-06-09 DIAGNOSIS — M79604 Pain in right leg: Secondary | ICD-10-CM

## 2020-06-09 NOTE — ED Notes (Signed)
Provided discharge instructions. Verbalized understanding. NAD. Ambulating to waiting room.

## 2020-06-09 NOTE — ED Notes (Signed)
See triage note  Presents with swelling to right lower leg  And also having some discomfort to posterior calf  No redness noted  Swelling noted from knee into ankle

## 2020-06-09 NOTE — Telephone Encounter (Signed)
[facility]:  Caller has a knee fracture on the right. Pt says her feet look a little swollen. Pt says she took her brace off because it started to feel weird and then it swelled all the way up. She says the left side is the same way but not as bad as the right. Pt says it does not hurt. She states from the knee down to the ankle on the right side and the left its the ankle that is slightly swollen   [facility] has advised for pt to go to ED now. Patient has understood.

## 2020-06-09 NOTE — ED Notes (Signed)
Spoke with Dr. Archie Balboa, regarding pt states to start off with imaging at this time. No bloodwork.

## 2020-06-09 NOTE — ED Provider Notes (Signed)
[facility] Emergency Department Provider Note  ____________________________________________   Event Date/Time   First MD Initiated Contact with Patient 06/09/20 1539     (approximate)  I have reviewed the triage vital signs and the nursing notes.   HISTORY  Chief Complaint Leg Swelling    HPI Paula Parker is a 60 y.o. female presents emergency department complaining of right lower leg pain and swelling.  Patient has a history of a recent patella fracture and has been in a knee immobilizer.  She states she has done more than she normally has been doing.  She states she has had pain in the back of the Back of the knee and some swelling of the foot.  No numbness or tingling.  No chest pain or shortness of breath.  No trouble sleeping while laying flat.    Past Medical History:  Diagnosis Date  . Fatty liver   . Gallstones   . GERD (gastroesophageal reflux disease)   . GLAUCOMA, BORDERLINE   . Hiatal hernia   . HTN (hypertension)   . HYPERLIPIDEMIA   . HYPERTENSION   . PALPITATIONS, OCCASIONAL   . Pseudomembranous colitis   . SMOKER     Patient Active Problem List   Diagnosis Date Noted  . Age-related osteoporosis with current pathological fracture 05/15/2020  . Hyperglycemia 05/15/2020  . Chronic renal impairment, stage 3b ([place]) 05/15/2020  . Closed nondisplaced fracture of right patella with routine healing 05/12/2020  . Gastroesophageal reflux disease without esophagitis 08/09/2019  . Nonalcoholic steatohepatitis ([facility]) 11/02/2018  . Irritable bowel syndrome with diarrhea 10/10/2018  . Visit for screening mammogram 04/27/2018  . Vitamin D deficiency disease 10/11/2017  . DJD (degenerative joint disease), ankle and foot, left 07/15/2016  . Right lumbar radiculitis 12/05/2014  . Allergic rhinitis 07/19/2013  . GERD (gastroesophageal reflux disease) 04/17/2013  . Colon cancer screening 04/17/2013  . Atrophic vaginitis 02/03/2012  .  Routine general medical examination at a health care facility 01/24/2012  . Alcoholic fatty liver 02/03/5944  . GLAUCOMA, BORDERLINE 04/27/2010  . HIATAL HERNIA 04/27/2010  . Essential hypertension, benign 06/02/2007    Past Surgical History:  Procedure Laterality Date  . CERVIX LESION DESTRUCTION  1980'S  . CHOLECYSTECTOMY N/A 02/03/2018   Procedure: LAPAROSCOPIC CHOLECYSTECTOMY;  Surgeon: Ileana Roup, MD;  Location: [facility] ORS;  Service: General;  Laterality: N/A;  . CHOLECYSTECTOMY    . COLONOSCOPY    . ESOPHAGOGASTRODUODENOSCOPY    . OVARY SURGERY     removed precancerous cells with a laser    Prior to Admission medications   Medication Sig Start Date End Date Taking? Authorizing Provider  Cholecalciferol 50 MCG (2000 UT) TABS Take 1 tablet (2,000 Units total) by mouth daily. 10/10/18   Janith Lima, MD  colestipol (COLESTID) 5 g granules Take 5 g by mouth daily with supper. 10/04/19   Gatha Mayer, MD  EDARBI 40 MG TABS TAKE 1 TABLET EACH DAY. 04/07/20   Janith Lima, MD  esomeprazole (NEXIUM) 40 MG capsule TAKE 1 CAPSULE (40 MG TOTAL) BY MOUTH DAILY AT 12 NOON. 04/10/20   Janith Lima, MD  Romosozumab-aqqg (EVENITY) 105 MG/1.17ML SOSY injection Inject 210 mg into the skin every 30 (thirty) days. 05/16/20   Janith Lima, MD    Allergies Doxycycline, Fish allergy, Benzonatate, Penicillins, and Sulfonamide derivatives  Family History  Problem Relation Age of Onset  . Ovarian cancer Sister 85  . Heart disease Sister   . Early  death Sister   . ALS Mother   . Heart disease Father   . Coronary artery disease Other        female first degree <50  . Diabetes Maternal Grandfather   . Diabetes Maternal Grandmother   . Stroke Neg Hx   . Kidney disease Neg Hx   . Hypertension Neg Hx   . Hyperlipidemia Neg Hx   . Osteoporosis Neg Hx     Social History Social History   Tobacco Use  . Smoking status: Former Smoker    Packs/day: 1.00    Types: Cigarettes    Quit  date: 04/27/2014    Years since quitting: 6.1  . Smokeless tobacco: Never Used  Vaping Use  . Vaping Use: Never used  Substance Use Topics  . Alcohol use: No    Alcohol/week: 0.0 standard drinks  . Drug use: No    Review of Systems  Constitutional: No fever/chills Eyes: No visual changes. ENT: No sore throat. Respiratory: Denies cough Cardiovascular: Denies chest pain Gastrointestinal: Denies abdominal pain Genitourinary: Negative for dysuria. Musculoskeletal: Negative for back pain.  Positive for right lower leg pain Skin: Negative for rash. Psychiatric: no mood changes,     ____________________________________________   PHYSICAL EXAM:  VITAL SIGNS: ED Triage Vitals  Enc Vitals Group     BP 06/09/20 1506 126/87     Pulse Rate 06/09/20 1506 95     Resp 06/09/20 1516 20     Temp 06/09/20 1506 98.5 F (36.9 C)     Temp Source 06/09/20 1506 Oral     SpO2 06/09/20 1506 94 %     Weight 06/09/20 1506 180 lb (81.6 kg)     Height 06/09/20 1506 5' (1.524 m)     Head Circumference --      Peak Flow --      Pain Score 06/09/20 1516 0     Pain Loc --      Pain Edu? --      Excl. in [place]? --     Constitutional: Alert and oriented. Well appearing and in no acute distress. Eyes: Conjunctivae are normal.  Head: Atraumatic. Nose: No congestion/rhinnorhea. Mouth/Throat: Mucous membranes are moist.   Neck:  supple no lymphadenopathy noted Cardiovascular: Normal rate, regular rhythm. Heart sounds are normal Respiratory: Normal respiratory effort.  No retractions, lungs c t a  GU: deferred Musculoskeletal: Range of motion not performed on the knee, swelling noted distally bilaterally, little worse on the right side, tender behind the right knee and calf.  Neurovascular is intact  neurologic:  Normal speech and language.  Skin:  Skin is warm, dry and intact. No rash noted. Psychiatric: Mood and affect are normal. Speech and behavior are  normal.  ____________________________________________   LABS (all labs ordered are listed, but only abnormal results are displayed)  Labs Reviewed - No data to display ____________________________________________   ____________________________________________  RADIOLOGY  Ultrasound of the right lower leg  ____________________________________________   PROCEDURES  Procedure(s) performed: No  Procedures    ____________________________________________   INITIAL IMPRESSION / ASSESSMENT AND PLAN / ED COURSE  Pertinent labs & imaging results that were available during my care of the patient were reviewed by me and considered in my medical decision making (see chart for details).   The patient is a 60 year old female presents with right lower leg pain and swelling.  See HPI.  Physical exam shows patient to appear stable.  Ultrasound of the right lower leg reviewed by me confirmed  by radiology to be negative for acute blood clot.  I did explain everything to the patient.  Feel that is because she has been up moving around more than usual and probably more than she should.  So she is more of a dependent edema.  She has no trouble lying flat to sleep so do not feel she has congestive heart failure.  She states she understands.  She is to follow-up with her regular orthopedist tomorrow for already scheduled appointment.  She was discharged in stable condition.     Paula Parker was evaluated in Emergency Department on 06/09/2020 for the symptoms described in the history of present illness. She was evaluated in the context of the global COVID-19 pandemic, which necessitated consideration that the patient might be at risk for infection with the SARS-CoV-2 virus that causes COVID-19. Institutional protocols and algorithms that pertain to the evaluation of patients at risk for COVID-19 are in a state of rapid change based on information released by regulatory bodies including the CDC and  federal and state organizations. These policies and algorithms were followed during the patient's care in the ED.    As part of my medical decision making, I reviewed the following data within the [place] notes reviewed and incorporated, Old chart reviewed, Radiograph reviewed , Notes from prior ED visits and [place] Controlled Substance Database  ____________________________________________   FINAL CLINICAL IMPRESSION(S) / ED DIAGNOSES  Final diagnoses:  Right leg pain      NEW MEDICATIONS STARTED DURING THIS VISIT:  New Prescriptions   No medications on file     Note:  This document was prepared using Dragon voice recognition software and may include unintentional dictation errors.    Versie Starks, PA-C 06/09/20 1636    Lucrezia Starch, MD 06/09/20 1739

## 2020-06-09 NOTE — ED Triage Notes (Signed)
Pt via POV from home. Pt has a hx of a R patellar fracture on 2/6. Pt states that she noticed her R knee starting to swell about 2 weeks ago. Pt states that last night her R foot started to . Denies any redness. Pt describes as a tight feeling. Denies any blood thinner use. Pedal pulse presents. Pt is A&Ox4 and NAD.

## 2020-06-10 ENCOUNTER — Telehealth: Payer: Self-pay | Admitting: Pharmacist

## 2020-06-10 ENCOUNTER — Ambulatory Visit (INDEPENDENT_AMBULATORY_CARE_PROVIDER_SITE_OTHER): Payer: No Typology Code available for payment source | Admitting: Orthopaedic Surgery

## 2020-06-10 ENCOUNTER — Telehealth: Payer: Self-pay | Admitting: Orthopaedic Surgery

## 2020-06-10 ENCOUNTER — Encounter: Payer: Self-pay | Admitting: Orthopaedic Surgery

## 2020-06-10 ENCOUNTER — Ambulatory Visit (INDEPENDENT_AMBULATORY_CARE_PROVIDER_SITE_OTHER): Payer: No Typology Code available for payment source

## 2020-06-10 DIAGNOSIS — S82001D Unspecified fracture of right patella, subsequent encounter for closed fracture with routine healing: Secondary | ICD-10-CM | POA: Diagnosis not present

## 2020-06-10 NOTE — Telephone Encounter (Signed)
Patient said she was advised she needed to extend her FMLA/Disability 4 more weeks. Patient will contact [employer] to advise them of this. The number to contact patient if needed is [phone]

## 2020-06-10 NOTE — Telephone Encounter (Signed)
FYI-possibly new forms coming for change/extension of disability

## 2020-06-10 NOTE — Progress Notes (Signed)
Patient is now 4 weeks status post a mechanical fall in which she sustained nondisplaced right patella fracture.  This was at the inferior pole of patella.  We had a weightbearing as tolerated but and a knee immobilizer.  She did have to go to the emergency room yesterday due to leg swelling.  An ultrasound was obtained ruling out a DVT in that right leg.  On exam her extensor mechanism is intact on the right side.  There is still some swelling to be expected with that knee.  The lateral view of the right knee shows interval healing of the inferior pole of patella fracture with no displacement at all.  I will transition her to a hinged knee brace and will allow her to bend her knee some but not past 90 degrees.  She will continue weight-bear as tolerated.  Given the fact that she is in a job requires her to walk long distances and stand for the majority of the day, I need to keep her out of work for four more weeks to allow for maximal healing and a full work return hopefully at that point.  When I see her back in 4 weeks we will need a final lateral view of her right knee.  All question concerns were answered and addressed.

## 2020-06-10 NOTE — Telephone Encounter (Signed)
Called patient to schedule an appointment for the [employer] [facility]. I was unable to reach the patient so I left a HIPAA-compliant message requesting that the patient return my call.   Luke Van Ausdall, PharmD, BCACP, CPP Clinical Pharmacist [facility] [phone]  

## 2020-06-11 ENCOUNTER — Telehealth: Payer: Self-pay | Admitting: Orthopaedic Surgery

## 2020-06-11 ENCOUNTER — Telehealth: Payer: Self-pay | Admitting: Internal Medicine

## 2020-06-11 ENCOUNTER — Other Ambulatory Visit: Payer: Self-pay | Admitting: Internal Medicine

## 2020-06-11 DIAGNOSIS — M8000XG Age-related osteoporosis with current pathological fracture, unspecified site, subsequent encounter for fracture with delayed healing: Secondary | ICD-10-CM

## 2020-06-11 DIAGNOSIS — M8000XA Age-related osteoporosis with current pathological fracture, unspecified site, initial encounter for fracture: Secondary | ICD-10-CM

## 2020-06-11 MED ORDER — EVENITY 105 MG/1.17ML ~~LOC~~ SOSY: 210.0000 mg | PREFILLED_SYRINGE | SUBCUTANEOUS | 11 refills | Status: DC

## 2020-06-11 NOTE — Telephone Encounter (Signed)
Patient submitted medical release form, extended FMLA forms, and $25.00 cash payment to [employer]. Accepted 06/11/20

## 2020-06-11 NOTE — Telephone Encounter (Signed)
Please send a Evenity prescription to [employer] to process this patients Evenity:  [place], [place] Phone:  [phone]  Fax:  [phone]

## 2020-06-11 NOTE — Telephone Encounter (Signed)
Received vm from pt stating needs note sent to [employer] that her FMLA has been extended 4 more weeks.I3/8/22 oow note faxed to [employer] [phone] leave# [phone]

## 2020-06-11 NOTE — Telephone Encounter (Signed)
Update:  Spoke to patient, she wants to delay her start of Evenity. She will call us back when she is ready.

## 2020-07-04 ENCOUNTER — Ambulatory Visit: Payer: No Typology Code available for payment source | Attending: Internal Medicine

## 2020-07-04 ENCOUNTER — Other Ambulatory Visit (HOSPITAL_COMMUNITY): Payer: Self-pay | Admitting: Internal Medicine

## 2020-07-04 DIAGNOSIS — Z23 Encounter for immunization: Secondary | ICD-10-CM

## 2020-07-04 NOTE — Progress Notes (Signed)
   Covid-19 Vaccination Clinic  Name:  Paula Parker    MRN: [id number] DOB: 05-13-1960  07/04/2020  [name] was observed post Covid-19 immunization for 15 minutes without incident. She was provided with Vaccine Information Sheet and instruction to access the V-Safe system.   [name] was instructed to call 911 with any severe reactions post vaccine: Marland Kitchen Difficulty breathing  . Swelling of face and throat  . A fast heartbeat  . A bad rash all over body  . Dizziness and weakness   Immunizations Administered    Name Date Dose VIS Date Route   PFIZER Comrnaty(Gray TOP) Covid-19 Vaccine 07/04/2020  9:26 AM 0.3 mL 03/13/2020 Intramuscular   Manufacturer: [employer], [employer]   Lot: [id number]   [place]: [phone]

## 2020-07-08 ENCOUNTER — Ambulatory Visit: Payer: No Typology Code available for payment source | Admitting: Orthopaedic Surgery

## 2020-07-09 ENCOUNTER — Encounter: Payer: Self-pay | Admitting: Orthopaedic Surgery

## 2020-07-09 ENCOUNTER — Ambulatory Visit (INDEPENDENT_AMBULATORY_CARE_PROVIDER_SITE_OTHER): Payer: No Typology Code available for payment source | Admitting: Orthopaedic Surgery

## 2020-07-09 ENCOUNTER — Ambulatory Visit (INDEPENDENT_AMBULATORY_CARE_PROVIDER_SITE_OTHER): Payer: No Typology Code available for payment source

## 2020-07-09 DIAGNOSIS — S82001D Unspecified fracture of right patella, subsequent encounter for closed fracture with routine healing: Secondary | ICD-10-CM

## 2020-07-09 NOTE — Progress Notes (Signed)
HPI: Paula Parker returns today 8 weeks status post nondisplaced right patella fracture.  She states she is overall doing well.  She has stopped wearing the brace whenever she is inside her home but whenever she is outside the home she does wear the brace.  She denies any mechanical symptoms of the knee.  She is wanting to go back to work full duties.  Physical exam: Right knee no abnormal warmth erythema.  She has some slight tenderness over the inferior pole of patella.  Able to do active straight leg raise.  Full flexion-extension of the knee actively.  No instability valgus varus stressing.  Remainder the knee is nontender.  Calf supple nontender.  No pitting edema bilateral lower extremities.  Radiographs: Lateral view right knee shows the inferior pole patella fracture to be well-healed.  Fracture remains anatomic.  No other bony abnormality seen.  Impression: Nondisplaced right patella fracture  Plan: She is activities as tolerated.  She will follow-up with [place] if she has any increased pain in the knee or mechanical symptoms which are reviewed.  Discussed quad strengthening exercises with her today.  Questions were encouraged and answered.  Follow-up as needed

## 2020-07-11 IMAGING — DX DG CHEST 2V
2 series · 2 of 2 positions shown · non-contrast
Comparison: 05/13/2014

CLINICAL DATA: Cough 4 days.

EXAM:
CHEST - 2 VIEW

[chest pa]
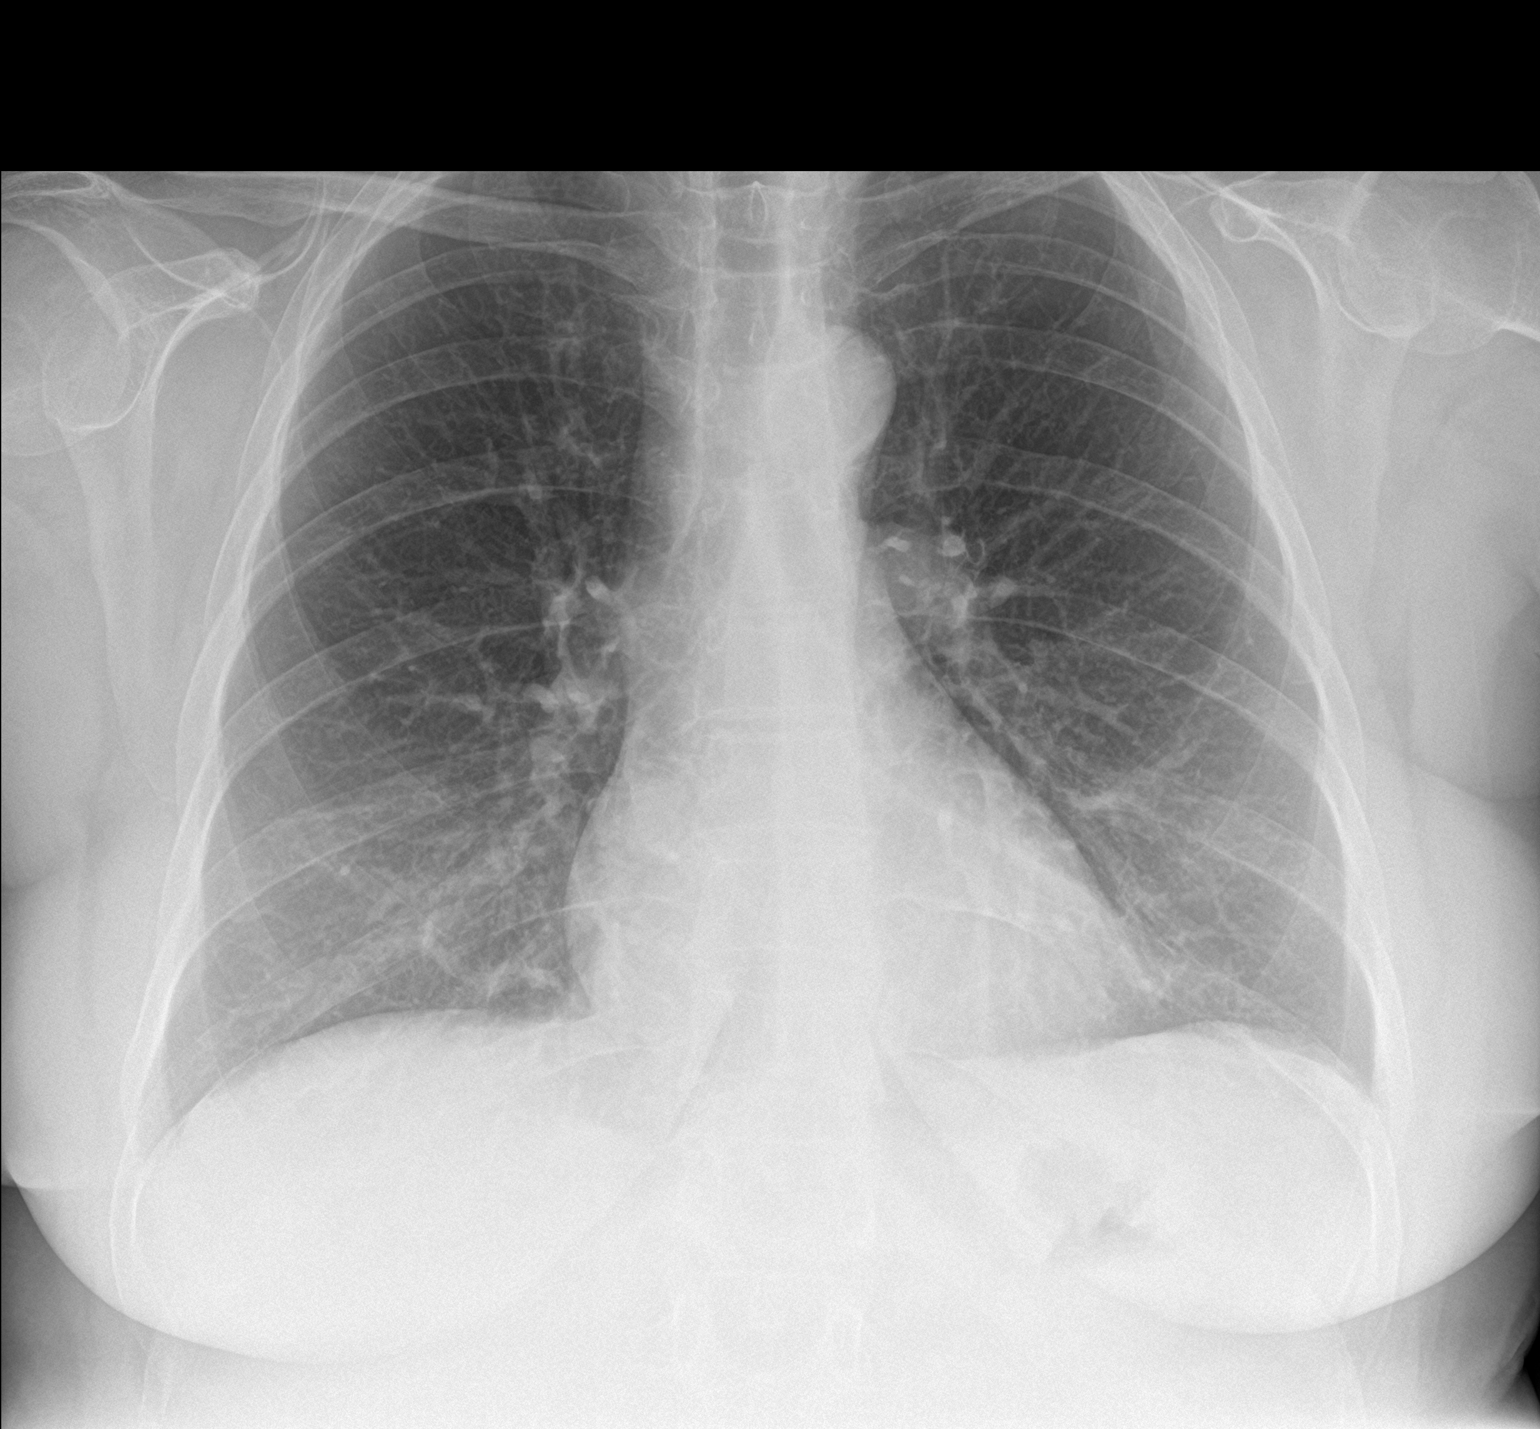

[chest lat]
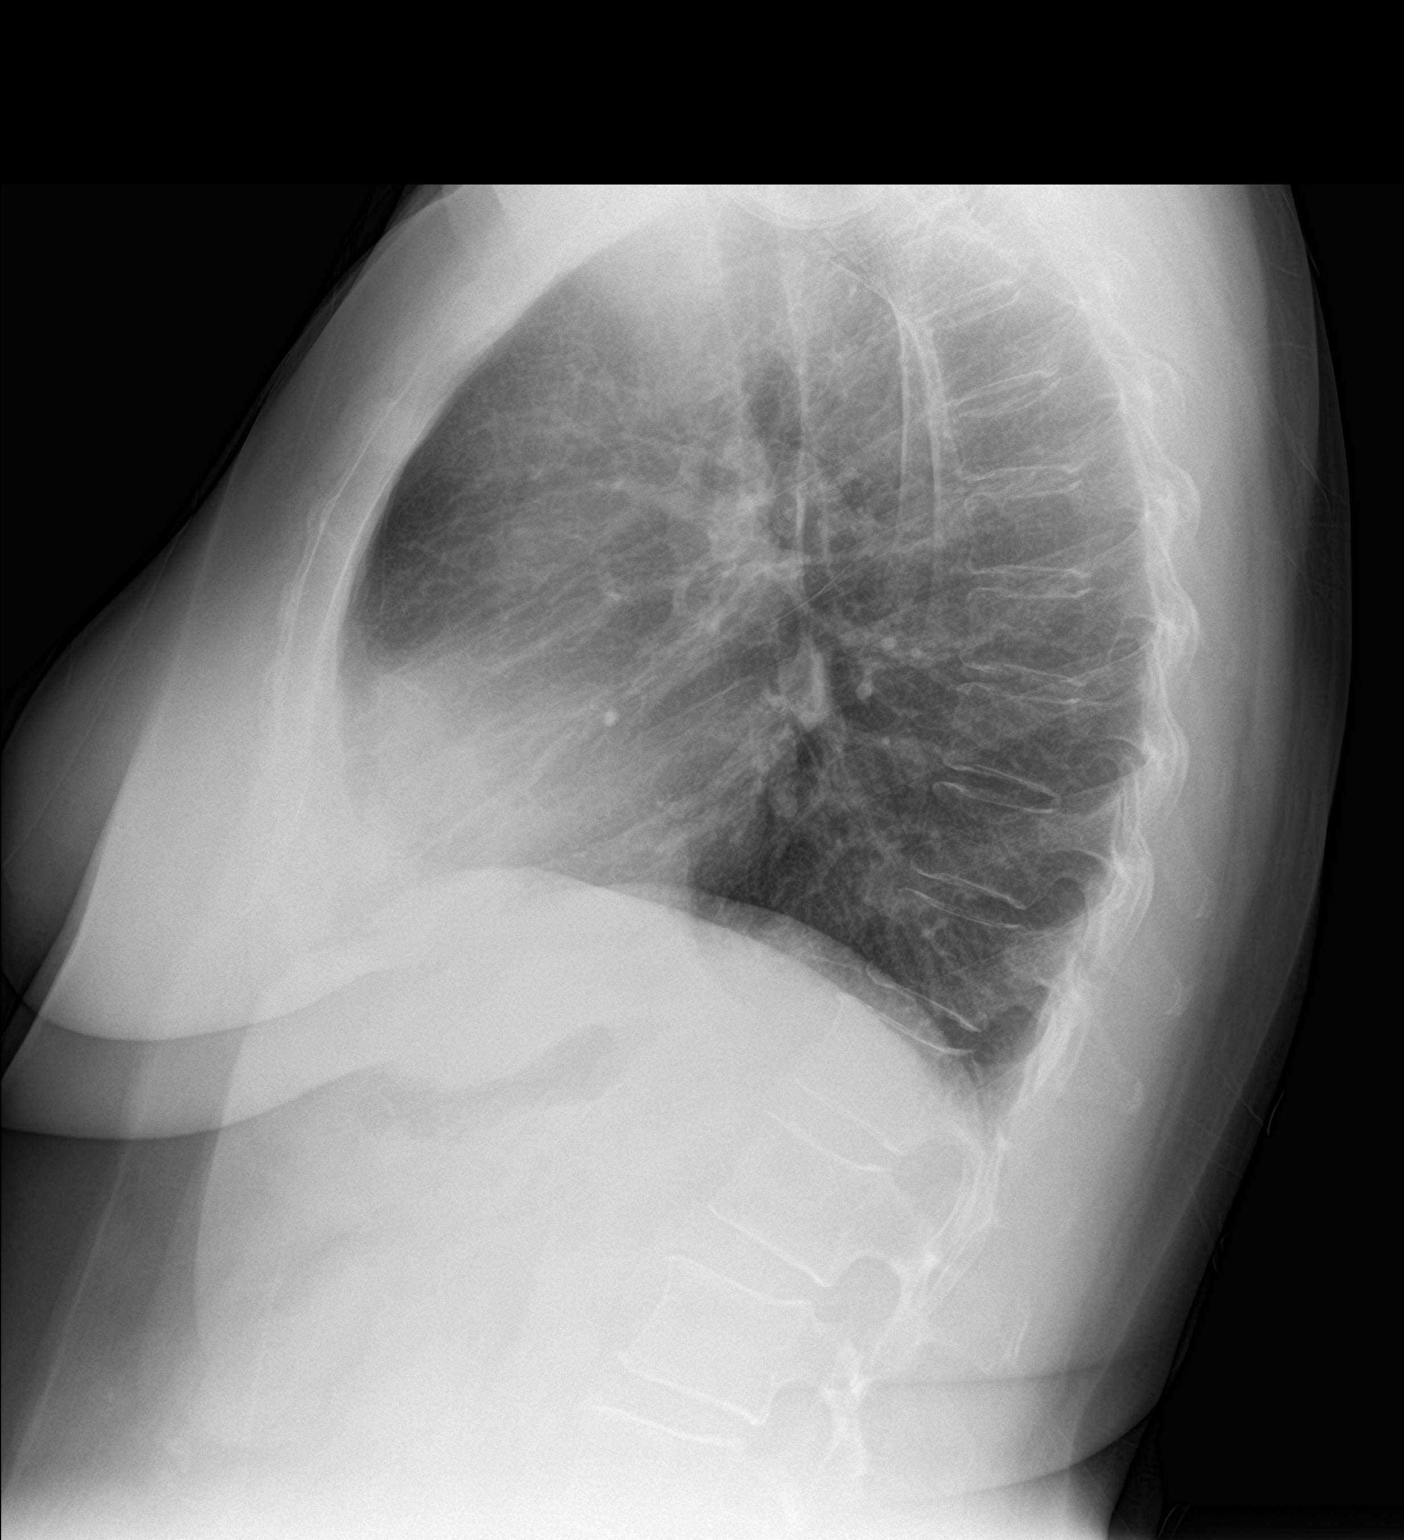

[2 of 2 positions shown; findings below may reference images not displayed]

FINDINGS: Cardiomediastinal silhouette is normal. Mediastinal contours appear
intact.

There is no evidence of focal airspace consolidation, pleural
effusion or pneumothorax.

Osseous structures are without acute abnormality. Soft tissues are
grossly normal.
IMPRESSION: No active cardiopulmonary disease.

## 2020-07-14 ENCOUNTER — Other Ambulatory Visit (HOSPITAL_COMMUNITY): Payer: Self-pay

## 2020-07-14 MED FILL — Esomeprazole Magnesium Cap Delayed Release 40 MG (Base Eq): ORAL | 90 days supply | Qty: 90 | Fill #0 | Status: AC

## 2020-07-24 ENCOUNTER — Encounter: Payer: Self-pay | Admitting: Internal Medicine

## 2020-08-12 ENCOUNTER — Ambulatory Visit (INDEPENDENT_AMBULATORY_CARE_PROVIDER_SITE_OTHER): Payer: Self-pay

## 2020-08-12 ENCOUNTER — Ambulatory Visit (INDEPENDENT_AMBULATORY_CARE_PROVIDER_SITE_OTHER): Payer: Self-pay | Admitting: Internal Medicine

## 2020-08-12 ENCOUNTER — Other Ambulatory Visit: Payer: Self-pay

## 2020-08-12 ENCOUNTER — Encounter: Payer: Self-pay | Admitting: Internal Medicine

## 2020-08-12 VITALS — BP 154/94 | HR 94 | Temp 98.1°F | Resp 16 | Ht 60.0 in | Wt 185.0 lb

## 2020-08-12 DIAGNOSIS — I10 Essential (primary) hypertension: Secondary | ICD-10-CM

## 2020-08-12 DIAGNOSIS — R059 Cough, unspecified: Secondary | ICD-10-CM

## 2020-08-12 DIAGNOSIS — J4521 Mild intermittent asthma with (acute) exacerbation: Secondary | ICD-10-CM | POA: Insufficient documentation

## 2020-08-12 MED ORDER — INDAPAMIDE 1.25 MG PO TABS: 1.2500 mg | ORAL_TABLET | Freq: Every day | ORAL | 0 refills | Status: DC

## 2020-08-12 MED ORDER — MONTELUKAST SODIUM 10 MG PO TABS: 10.0000 mg | ORAL_TABLET | Freq: Every day | ORAL | 3 refills | Status: DC

## 2020-08-12 MED ORDER — BUDESONIDE 90 MCG/ACT IN AEPB
1.0000 | INHALATION_SPRAY | Freq: Two times a day (BID) | RESPIRATORY_TRACT | 1 refills | Status: DC
Start: 2020-08-12 — End: 2020-08-13

## 2020-08-12 NOTE — Patient Instructions (Signed)

## 2020-08-12 NOTE — Progress Notes (Signed)
Subjective:  Patient ID: Paula Parker, female    DOB: 09-Jan-1961  Age: 60 y.o. MRN: [id number]  CC: Hypertension and Cough  This visit occurred during the SARS-CoV-2 public health emergency.  Safety protocols were in place, including screening questions prior to the visit, additional usage of staff PPE, and extensive cleaning of exam room while observing appropriate contact time as indicated for disinfecting solutions.    HPI Paula Parker presents for f/up -   She had a COVID-19 infection about 3 months ago.  She has had a persistent cough since then.  She says the cough is triggered whenever she starts talking.  She has a history of asthma.  She tells me the cough is nonproductive and she denies chest pain, hemoptysis, fever, chills, wheezing, or shortness of breath.  Outpatient Medications Prior to Visit  Medication Sig Dispense Refill  . Cholecalciferol 50 MCG (2000 UT) TABS Take 1 tablet (2,000 Units total) by mouth daily. 90 tablet 1  . EDARBI 40 MG TABS TAKE 1 TABLET EACH DAY. 90 tablet 1  . esomeprazole (NEXIUM) 40 MG capsule TAKE 1 CAPSULE (40 MG TOTAL) BY MOUTH DAILY AT 12 NOON. 90 capsule 1  . COVID-19 mRNA Vac-TriS, Pfizer, SUSP injection USE AS DIRECTED .3 mL 0  . Romosozumab-aqqg (EVENITY) 105 MG/1.17ML SOSY injection INJECT 210 MG INTO THE SKIN EVERY 30 (THIRTY) DAYS. 2.34 mL 11   No facility-administered medications prior to visit.    ROS Review of Systems  Constitutional: Negative for diaphoresis, fatigue and unexpected weight change.  HENT: Negative.   Respiratory: Positive for cough. Negative for shortness of breath and wheezing.   Cardiovascular: Negative for chest pain, palpitations and leg swelling.  Gastrointestinal: Negative for abdominal pain, diarrhea and nausea.  Genitourinary: Negative.   Musculoskeletal: Negative.   Skin: Negative.  Negative for rash.  Neurological: Negative.  Negative for dizziness, weakness, light-headedness and headaches.   Hematological: Negative for adenopathy. Does not bruise/bleed easily.  Psychiatric/Behavioral: Negative.     Objective:  BP (!) 154/94 (BP Location: Left Arm, Patient Position: Sitting, Cuff Size: Large)   Pulse 94   Temp 98.1 F (36.7 C) (Oral)   Resp 16   Ht 5' (1.524 m)   Wt 185 lb (83.9 kg)   SpO2 96%   BMI 36.13 kg/m   BP Readings from Last 3 Encounters:  08/12/20 (!) 154/94  06/09/20 126/87  05/15/20 118/84    Wt Readings from Last 3 Encounters:  08/12/20 185 lb (83.9 kg)  06/09/20 180 lb (81.6 kg)  05/15/20 184 lb (83.5 kg)    Physical Exam Vitals reviewed.  Constitutional:      General: She is not in acute distress.    Appearance: She is not ill-appearing, toxic-appearing or diaphoretic.  HENT:     Nose: Nose normal.     Mouth/Throat:     Mouth: Mucous membranes are moist.  Eyes:     General: No scleral icterus.    Conjunctiva/sclera: Conjunctivae normal.  Cardiovascular:     Rate and Rhythm: Normal rate and regular rhythm.     Heart sounds: No murmur heard.   Pulmonary:     Effort: Pulmonary effort is normal.     Breath sounds: No stridor. No wheezing, rhonchi or rales.  Abdominal:     General: Abdomen is flat. There is no distension.     Palpations: Abdomen is soft. There is no hepatomegaly, splenomegaly or mass.     Tenderness: There is no abdominal  tenderness.  Musculoskeletal:        General: Normal range of motion.     Cervical back: Neck supple.     Right lower leg: No edema.     Left lower leg: No edema.  Lymphadenopathy:     Cervical: No cervical adenopathy.  Skin:    General: Skin is warm and dry.  Neurological:     Mental Status: She is alert. Mental status is at baseline.  Psychiatric:        Mood and Affect: Mood normal.        Behavior: Behavior normal.     Lab Results  Component Value Date   WBC 9.6 05/11/2020   HGB 13.9 05/11/2020   HCT 41.3 05/11/2020   PLT 305 05/11/2020   GLUCOSE 89 05/28/2020   CHOL 175  05/15/2020   TRIG 115.0 05/15/2020   HDL 58.60 05/15/2020   LDLCALC 94 05/15/2020   ALT 28 02/12/2020   AST 25 02/12/2020   NA 140 05/28/2020   K 3.9 05/28/2020   CL 105 05/28/2020   CREATININE 0.98 05/28/2020   BUN 16 05/28/2020   CO2 30 05/28/2020   TSH 2.16 02/12/2020   HGBA1C 6.1 05/16/2020    US Venous Img Lower Unilateral Right  Result Date: 06/09/2020 CLINICAL DATA:  Pain and swelling for 2 weeks EXAM: RIGHT LOWER EXTREMITY VENOUS DOPPLER ULTRASOUND TECHNIQUE: Gray-scale sonography with graded compression, as well as color Doppler and duplex ultrasound were performed to evaluate the lower extremity deep venous systems from the level of the common femoral vein and including the common femoral, femoral, profunda femoral, popliteal and calf veins including the posterior tibial, peroneal and gastrocnemius veins when visible. The superficial great saphenous vein was also interrogated. Spectral Doppler was utilized to evaluate flow at rest and with distal augmentation maneuvers in the common femoral, femoral and popliteal veins. COMPARISON:  None. FINDINGS: Contralateral Common Femoral Vein: Respiratory phasicity is normal and symmetric with the symptomatic side. No evidence of thrombus. Normal compressibility. Common Femoral Vein: No evidence of thrombus. Normal compressibility, respiratory phasicity and response to augmentation. Saphenofemoral Junction: No evidence of thrombus. Normal compressibility and flow on color Doppler imaging. Profunda Femoral Vein: No evidence of thrombus. Normal compressibility and flow on color Doppler imaging. Femoral Vein: No evidence of thrombus. Normal compressibility, respiratory phasicity and response to augmentation. Popliteal Vein: No evidence of thrombus. Normal compressibility, respiratory phasicity and response to augmentation. Calf Veins: No evidence of thrombus. Normal compressibility and flow on color Doppler imaging. IMPRESSION: No evidence of deep  venous thrombosis. Electronically Signed   By: Jerilynn Mages.  Shick M.D.   On: 06/09/2020 16:18   DG Chest 2 View  Result Date: 08/13/2020 CLINICAL DATA:  Cough for 3 months.  Previous COVID-19 infection. EXAM: CHEST - 2 VIEW COMPARISON:  Radiographs 12/15/2017. FINDINGS: There are lower lung volumes. The heart size and mediastinal contours are stable without evidence of adenopathy. There is no pleural effusion or pneumothorax. Mild central airway thickening is present with mildly increased markings at both lung bases. There is no confluent airspace opacity or edema. Cholecystectomy clips are noted. IMPRESSION: Lower lung volumes with central airway thickening and increased markings at both lung bases, possibly postinflammatory scarring. No focal airspace disease or pleural effusion. Electronically Signed   By: Richardean Sale M.D.   On: 08/13/2020 13:31    Assessment & Plan:   Paula Parker was seen today for hypertension and cough.  Diagnoses and all orders for this visit:  Cough- She  has a persistent cough and a history of asthma.  Will treat with a leukotriene inhibitor and an inhaled corticosteroid.  Her chest x-ray is suspicious for an inflammatory process like pneumonitis.  I recommended that she see pulmonology as well. -     DG Chest 2 View; Future -     Ambulatory referral to Pulmonology  Essential hypertension, benign- Her blood pressure is not adequately well controlled.  Will add indapamide to her current regimen. -     indapamide (LOZOL) 1.25 MG tablet; Take 1 tablet (1.25 mg total) by mouth daily.  Mild intermittent asthma with acute exacerbation -     Discontinue: Budesonide 90 MCG/ACT inhaler; Inhale 1 puff into the lungs 2 (two) times daily. -     montelukast (SINGULAIR) 10 MG tablet; Take 1 tablet (10 mg total) by mouth at bedtime. -     fluticasone (FLOVENT HFA) 110 MCG/ACT inhaler; Inhale 1 puff into the lungs in the morning and at bedtime.   I have discontinued Connye Burkitt. Davitt's  Romosozumab-aqqg, Budesonide, and COVID-19 mRNA Vac-TriS [employer]). I am also having her start on montelukast, indapamide, and fluticasone. Additionally, I am having her maintain her Cholecalciferol, Edarbi, and esomeprazole.  Meds ordered this encounter  Medications  . DISCONTD: Budesonide 90 MCG/ACT inhaler    Sig: Inhale 1 puff into the lungs 2 (two) times daily.    Dispense:  3 each    Refill:  1  . montelukast (SINGULAIR) 10 MG tablet    Sig: Take 1 tablet (10 mg total) by mouth at bedtime.    Dispense:  30 tablet    Refill:  3  . indapamide (LOZOL) 1.25 MG tablet    Sig: Take 1 tablet (1.25 mg total) by mouth daily.    Dispense:  90 tablet    Refill:  0  . fluticasone (FLOVENT HFA) 110 MCG/ACT inhaler    Sig: Inhale 1 puff into the lungs in the morning and at bedtime.    Dispense:  3 each    Refill:  1     Follow-up: Return in about 3 months (around 11/12/2020).  Scarlette Calico, MD

## 2020-08-13 ENCOUNTER — Encounter: Payer: Self-pay | Admitting: Internal Medicine

## 2020-08-13 MED ORDER — FLUTICASONE PROPIONATE HFA 110 MCG/ACT IN AERO: 1.0000 | INHALATION_SPRAY | Freq: Two times a day (BID) | RESPIRATORY_TRACT | 1 refills | Status: DC

## 2020-08-27 ENCOUNTER — Encounter: Payer: Self-pay | Admitting: Internal Medicine

## 2020-08-27 ENCOUNTER — Other Ambulatory Visit: Payer: Self-pay | Admitting: Internal Medicine

## 2020-08-27 ENCOUNTER — Other Ambulatory Visit (HOSPITAL_COMMUNITY): Payer: Self-pay

## 2020-08-27 DIAGNOSIS — I10 Essential (primary) hypertension: Secondary | ICD-10-CM

## 2020-08-27 MED ORDER — EDARBI 40 MG PO TABS
1.0000 | ORAL_TABLET | Freq: Every day | ORAL | 1 refills | Status: DC
  Filled 2020-08-27: qty 30, 30d supply, fill #0

## 2020-09-17 ENCOUNTER — Other Ambulatory Visit: Payer: Self-pay | Admitting: Internal Medicine

## 2020-09-17 DIAGNOSIS — I10 Essential (primary) hypertension: Secondary | ICD-10-CM

## 2020-10-15 ENCOUNTER — Ambulatory Visit (INDEPENDENT_AMBULATORY_CARE_PROVIDER_SITE_OTHER): Payer: Self-pay | Admitting: Pulmonary Disease

## 2020-10-15 ENCOUNTER — Encounter: Payer: Self-pay | Admitting: Pulmonary Disease

## 2020-10-15 ENCOUNTER — Other Ambulatory Visit: Payer: Self-pay

## 2020-10-15 VITALS — BP 142/88 | HR 96 | Ht 60.0 in | Wt 183.0 lb

## 2020-10-15 DIAGNOSIS — R059 Cough, unspecified: Secondary | ICD-10-CM

## 2020-10-15 DIAGNOSIS — J849 Interstitial pulmonary disease, unspecified: Secondary | ICD-10-CM

## 2020-10-15 DIAGNOSIS — Z8616 Personal history of COVID-19: Secondary | ICD-10-CM

## 2020-10-15 DIAGNOSIS — J45909 Unspecified asthma, uncomplicated: Secondary | ICD-10-CM

## 2020-10-15 MED ORDER — FLUTICASONE FUROATE-VILANTEROL 200-25 MCG/INH IN AEPB: 1.0000 | INHALATION_SPRAY | Freq: Every day | RESPIRATORY_TRACT | 0 refills | Status: DC

## 2020-10-15 NOTE — Progress Notes (Signed)
Synopsis: Referred in July 2022 for cough by Janith Lima, MD  Subjective:   PATIENT ID: Paula Parker GENDER: female DOB: 09-26-1960, MRN: [id number]  Chief Complaint  Patient presents with   Consult    Chronic cough, when eating ice cream, laugh, post covid February 2022    PMH GERD, hiatal hernia, HLD HTN, pre-diabetic, lived [place] , 2 years ago moved to [place], married, 3 dogs, three grandchildren. History of covid in Feb. She had fractured her knee (hospital and doc offices), mild symtpoms (5 days of illness and tired fro 1 weeks plus). Eventhough the spring time she felt SOB while working in her garden. 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 Chlorox causes her to cough.  Occasionally has symptoms at nighttime.  Cough is nonproductive.  Patient denies hemoptysis   Past Medical History:  Diagnosis Date   Fatty liver    Gallstones    GERD (gastroesophageal reflux disease)    GLAUCOMA, BORDERLINE    Hiatal hernia    HTN (hypertension)    HYPERLIPIDEMIA    HYPERTENSION    PALPITATIONS, OCCASIONAL    Pseudomembranous colitis    SMOKER      Family History  Problem Relation Age of Onset   Ovarian cancer Sister 33   Heart disease Sister    Early death Sister    ALS Mother    Heart disease Father    Coronary artery disease Other        female first degree <50   Diabetes Maternal Grandfather    Diabetes Maternal Grandmother    Stroke Neg Hx    Kidney disease Neg Hx    Hypertension Neg Hx    Hyperlipidemia Neg Hx    Osteoporosis Neg Hx      Past Surgical History:  Procedure Laterality Date   CERVIX LESION DESTRUCTION  1980'S   CHOLECYSTECTOMY N/A 02/03/2018   Procedure: LAPAROSCOPIC CHOLECYSTECTOMY;  Surgeon: Ileana Roup, MD;  Location: [facility];  Service: General;  Laterality: N/A;   CHOLECYSTECTOMY     COLONOSCOPY     ESOPHAGOGASTRODUODENOSCOPY     OVARY SURGERY     removed precancerous cells with a laser    Social  History   Socioeconomic History   Marital status: Married    Spouse name: Not on file   Number of children: 1   Years of education: Not on file   Highest education level: Not on file  Occupational History    Employer:   Tobacco Use   Smoking status: Former    Packs/day: 1.00    Pack years: 0.00    Types: Cigarettes    Quit date: 04/27/2014    Years since quitting: 6.4   Smokeless tobacco: Never  Vaping Use   Vaping Use: Never used  Substance and Sexual Activity   Alcohol use: No    Alcohol/week: 0.0 standard drinks   Drug use: No   Sexual activity: Not Currently  Other Topics Concern   Not on file  Social History Narrative   Married 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  1-2 caffeinated beverages daily.   Social Determinants of Health   Financial Resource Strain: Not on file  Food Insecurity: Not on file  Transportation Needs: Not on file  Physical Activity: Not on file  Stress: Not on file  Social Connections: Not on file  Intimate Partner Violence: Not on file     Allergies  Allergen Reactions   Doxycycline Other (See Comments)    Throat swelling   Fish Allergy     Pt ate flounder and calves swelled   Benzonatate Rash and Other (See Comments)   Penicillins Rash    Has patient had a PCN reaction causing immediate rash, facial/tongue/throat swelling, SOB or lightheadedness with hypotension: Yes Has patient had a PCN reaction causing severe rash involving mucus membranes or skin necrosis: No Has patient had a PCN reaction that required hospitalization: No Has patient had a PCN reaction occurring within the last 10 years: Yes If all of the above answers are "NO", then may proceed with Cephalosporin use.     Sulfonamide Derivatives Rash and Other (See Comments)    unknown     Outpatient Medications Prior to Visit  Medication Sig Dispense Refill   Cholecalciferol 50 MCG  (2000 UT) TABS Take 1 tablet (2,000 Units total) by mouth daily. 90 tablet 1   EDARBI 40 MG TABS TAKE 1 TABLET EACH DAY. 30 tablet 5   esomeprazole (NEXIUM) 40 MG capsule TAKE 1 CAPSULE (40 MG TOTAL) BY MOUTH DAILY AT 12 NOON. 90 capsule 1   fluticasone (FLOVENT HFA) 110 MCG/ACT inhaler Inhale 1 puff into the lungs in the morning and at bedtime. 3 each 1   indapamide (LOZOL) 1.25 MG tablet Take 1 tablet (1.25 mg total) by mouth daily. 90 tablet 0   montelukast (SINGULAIR) 10 MG tablet Take 1 tablet (10 mg total) by mouth at bedtime. 30 tablet 3   No facility-administered medications prior to visit.    Review of Systems  Constitutional:  Negative for chills, fever, malaise/fatigue and weight loss.  HENT:  Negative for hearing loss, sore throat and tinnitus.   Eyes:  Negative for blurred vision and double vision.  Respiratory:  Positive for cough. Negative for hemoptysis, sputum production, shortness of breath, wheezing and stridor.   Cardiovascular:  Negative for chest pain, palpitations, orthopnea, leg swelling and PND.  Gastrointestinal:  Negative for abdominal pain, constipation, diarrhea, heartburn, nausea and vomiting.  Genitourinary:  Negative for dysuria, hematuria and urgency.  Musculoskeletal:  Negative for joint pain and myalgias.  Skin:  Negative for itching and rash.  Neurological:  Negative for dizziness, tingling, weakness and headaches.  Endo/Heme/Allergies:  Negative for environmental allergies. Does not bruise/bleed easily.  Psychiatric/Behavioral:  Negative for depression. The patient is not nervous/anxious and does not have insomnia.   All other systems reviewed and are negative.   Objective:  Physical Exam Vitals reviewed.  Constitutional:      General: She is not in acute distress.    Appearance: She is well-developed.  HENT:     Head: Normocephalic and atraumatic.  Eyes:     General: No scleral icterus.    Conjunctiva/sclera: Conjunctivae normal.     Pupils:  Pupils are equal, round, and reactive to light.  Neck:     Vascular: No JVD.     Trachea: No tracheal deviation.  Cardiovascular:     Rate and Rhythm: Normal rate and regular rhythm.     Heart sounds: Normal heart sounds. No murmur heard. Pulmonary:     Effort: Pulmonary effort is normal. No tachypnea, accessory muscle usage or respiratory distress.     Breath sounds: No stridor. No wheezing, rhonchi or rales.  Abdominal:     General: Bowel sounds are normal. There is no distension.     Palpations: Abdomen is  soft.     Tenderness: There is no abdominal tenderness.  Musculoskeletal:        General: No tenderness.     Cervical back: Neck supple.  Lymphadenopathy:     Cervical: No cervical adenopathy.  Skin:    General: Skin is warm and dry.     Capillary Refill: Capillary refill takes less than 2 seconds.     Findings: No rash.  Neurological:     Mental Status: She is alert and oriented to person, place, and time.  Psychiatric:        Behavior: Behavior normal.     Vitals:   10/15/20 0903  BP: (!) 142/88  Pulse: 96  SpO2: 99%  Weight: 183 lb (83 kg)  Height: 5' (1.524 m)   99% on RA BMI Readings from Last 3 Encounters:  10/15/20 35.74 kg/m  08/12/20 36.13 kg/m  06/09/20 35.15 kg/m   Wt Readings from Last 3 Encounters:  10/15/20 183 lb (83 kg)  08/12/20 185 lb (83.9 kg)  06/09/20 180 lb (81.6 kg)     CBC    Component Value Date/Time   WBC 9.6 05/11/2020 1951   RBC 4.43 05/11/2020 1951   HGB 13.9 05/11/2020 1951   HCT 41.3 05/11/2020 1951   PLT 305 05/11/2020 1951   MCV 93.2 05/11/2020 1951   MCH 31.4 05/11/2020 1951   MCHC 33.7 05/11/2020 1951   RDW 13.1 05/11/2020 1951   LYMPHSABS 1.7 02/12/2020 0930   MONOABS 0.5 02/12/2020 0930   EOSABS 0.2 02/12/2020 0930   BASOSABS 0.0 02/12/2020 0930     Chest Imaging: Chest x-ray 08/12/2020: Low lung volumes central airway thickening bilateral lung bases with interstitial markings, possible  postinflammatory scarring related to her history of COVID no effusion The patient's images have been independently reviewed by me.  .  Pulmonary Functions Testing Results: No flowsheet data found.  FeNO: na  Pathology: na  Echocardiogram: na  Heart Catheterization: na    Assessment & Plan:     ICD-10-CM   1. Cough  R05.9 CT CHEST HIGH RESOLUTION    Pulmonary Function Test    2. Interstitial pulmonary disease (HCC)  J84.9 CT CHEST HIGH RESOLUTION    3. History of COVID-19  Z86.16 Pulmonary Function Test    4. Mild reactive airways disease, unspecified whether persistent  J45.909       Discussion:  This is a 60 year old female, seen today for persistent cough, reactive airway type symptoms have been persistent since March after COVID.  Also in 2020 when she had a EGD by Dr. Carlean Purl that revealed a 5 cm hiatal hernia that was not present on a 2019 CT abdomen.  Plan: We will obtain HRCT scan of the chest after review of her recent chest x-ray which reveals basilar infiltrates. This will rule out any remaining scarring and ILD related to COVID-19. We will give her a sample of Breo 200 and a new prescription. Think this will help with her reactive airway type symptoms post COVID. Depending upon the size of her hiatal hernia on CT imaging we can consider referral to thoracic surgery, Dr. Kipp Brood for consideration of robotic repair.  Patient is agreeable to this plan.  Follow-up with me or APP in approximately 6 to 8 weeks.   Current Outpatient Medications:    Cholecalciferol 50 MCG (2000 UT) TABS, Take 1 tablet (2,000 Units total) by mouth daily., Disp: 90 tablet, Rfl: 1   EDARBI 40 MG TABS, TAKE 1 TABLET EACH DAY.,  Disp: 30 tablet, Rfl: 5   esomeprazole (NEXIUM) 40 MG capsule, TAKE 1 CAPSULE (40 MG TOTAL) BY MOUTH DAILY AT 12 NOON., Disp: 90 capsule, Rfl: 1   fluticasone (FLOVENT HFA) 110 MCG/ACT inhaler, Inhale 1 puff into the lungs in the morning and at bedtime., Disp: 3  each, Rfl: 1   indapamide (LOZOL) 1.25 MG tablet, Take 1 tablet (1.25 mg total) by mouth daily., Disp: 90 tablet, Rfl: 0   montelukast (SINGULAIR) 10 MG tablet, Take 1 tablet (10 mg total) by mouth at bedtime., Disp: 30 tablet, Rfl: 3    Garner Nash, DO [facility] 10/15/2020 9:15 AM

## 2020-10-15 NOTE — Patient Instructions (Addendum)
Thank you for visiting Dr. Valeta Harms at [facility]. Today we recommend the following:  Orders Placed This Encounter  Procedures   CT CHEST HIGH RESOLUTION   Pulmonary Function Test   Samples today.   Return in about 6 weeks (around 11/26/2020) for with APP or Dr. Valeta Harms.    Please do your part to reduce the spread of COVID-19.

## 2020-10-20 ENCOUNTER — Telehealth: Payer: Self-pay

## 2020-10-20 ENCOUNTER — Other Ambulatory Visit (HOSPITAL_COMMUNITY): Payer: Self-pay

## 2020-10-20 ENCOUNTER — Other Ambulatory Visit: Payer: Self-pay | Admitting: Internal Medicine

## 2020-10-20 DIAGNOSIS — K219 Gastro-esophageal reflux disease without esophagitis: Secondary | ICD-10-CM

## 2020-10-20 MED ORDER — ESOMEPRAZOLE MAGNESIUM 40 MG PO CPDR
40.0000 mg | DELAYED_RELEASE_CAPSULE | ORAL | 1 refills | Status: DC
  Filled 2020-10-20: qty 90, 90d supply, fill #0
  Filled 2021-01-20: qty 90, 90d supply, fill #1

## 2020-10-20 NOTE — Telephone Encounter (Signed)
Key: [id number]

## 2020-11-03 ENCOUNTER — Ambulatory Visit
Admission: RE | Admit: 2020-11-03 | Discharge: 2020-11-03 | Disposition: A | Discharge status: Home / Self Care | Payer: Commercial Managed Care - PPO | Source: Ambulatory Visit | Attending: Pulmonary Disease | Admitting: Pulmonary Disease

## 2020-11-03 ENCOUNTER — Other Ambulatory Visit: Payer: Self-pay

## 2020-11-03 DIAGNOSIS — R059 Cough, unspecified: Secondary | ICD-10-CM

## 2020-11-03 DIAGNOSIS — J849 Interstitial pulmonary disease, unspecified: Secondary | ICD-10-CM

## 2020-11-12 ENCOUNTER — Encounter: Payer: Self-pay | Admitting: Internal Medicine

## 2020-11-12 ENCOUNTER — Ambulatory Visit: Payer: Commercial Managed Care - PPO | Admitting: Internal Medicine

## 2020-11-12 ENCOUNTER — Other Ambulatory Visit: Payer: Self-pay

## 2020-11-12 VITALS — BP 136/84 | HR 68 | Temp 98.3°F | Resp 16 | Ht 60.0 in | Wt 179.0 lb

## 2020-11-12 DIAGNOSIS — I10 Essential (primary) hypertension: Secondary | ICD-10-CM

## 2020-11-12 DIAGNOSIS — Z1231 Encounter for screening mammogram for malignant neoplasm of breast: Secondary | ICD-10-CM

## 2020-11-12 NOTE — Progress Notes (Signed)
Subjective:  Patient ID: Paula Parker, female    DOB: 1960/04/22  Age: 60 y.o. MRN: [id number]  CC: Hypertension  This visit occurred during the SARS-CoV-2 public health emergency.  Safety protocols were in place, including screening questions prior to the visit, additional usage of staff PPE, and extensive cleaning of exam room while observing appropriate contact time as indicated for disinfecting solutions.    HPI Paula Parker presents for f/up - She tells me her blood pressure has been well controlled.  She is active and denies chest pain, shortness of breath, diaphoresis, dizziness, lightheadedness, or edema.  She is decided not to take indapamide.  Outpatient Medications Prior to Visit  Medication Sig Dispense Refill   Cholecalciferol 50 MCG (2000 UT) TABS Take 1 tablet (2,000 Units total) by mouth daily. 90 tablet 1   EDARBI 40 MG TABS TAKE 1 TABLET EACH DAY. 30 tablet 5   esomeprazole (NEXIUM) 40 MG capsule TAKE 1 CAPSULE (40 MG TOTAL) BY MOUTH DAILY AT 12 NOON. 90 capsule 1   montelukast (SINGULAIR) 10 MG tablet Take 1 tablet (10 mg total) by mouth at bedtime. 30 tablet 3   indapamide (LOZOL) 1.25 MG tablet Take 1 tablet (1.25 mg total) by mouth daily. 90 tablet 0   fluticasone (FLOVENT HFA) 110 MCG/ACT inhaler Inhale 1 puff into the lungs in the morning and at bedtime. (Patient not taking: Reported on 11/12/2020) 3 each 1   fluticasone furoate-vilanterol (BREO ELLIPTA) 200-25 MCG/INH AEPB Inhale 1 puff into the lungs daily. (Patient not taking: Reported on 11/12/2020) 28 each 0   No facility-administered medications prior to visit.    ROS Review of Systems  Constitutional:  Negative for diaphoresis and fatigue.  HENT: Negative.    Eyes: Negative.   Respiratory:  Negative for chest tightness, shortness of breath and wheezing.   Cardiovascular:  Negative for chest pain, palpitations and leg swelling.  Gastrointestinal:  Negative for abdominal pain.  Endocrine: Negative.    Genitourinary: Negative.  Negative for difficulty urinating.  Musculoskeletal: Negative.   Skin: Negative.   Neurological: Negative.  Negative for dizziness, weakness and light-headedness.  Hematological:  Negative for adenopathy. Does not bruise/bleed easily.  Psychiatric/Behavioral: Negative.     Objective:  BP 136/84 (BP Location: Left Arm, Patient Position: Sitting, Cuff Size: Large)   Pulse 68   Temp 98.3 F (36.8 C) (Oral)   Resp 16   Ht 5' (1.524 m)   Wt 179 lb (81.2 kg)   SpO2 97%   BMI 34.96 kg/m   BP Readings from Last 3 Encounters:  11/12/20 136/84  10/15/20 (!) 142/88  08/12/20 (!) 154/94    Wt Readings from Last 3 Encounters:  11/12/20 179 lb (81.2 kg)  10/15/20 183 lb (83 kg)  08/12/20 185 lb (83.9 kg)    Physical Exam Vitals reviewed.  HENT:     Nose: Nose normal.     Mouth/Throat:     Mouth: Mucous membranes are moist.  Eyes:     Conjunctiva/sclera: Conjunctivae normal.  Cardiovascular:     Rate and Rhythm: Normal rate and regular rhythm.     Heart sounds: No murmur heard. Pulmonary:     Effort: Pulmonary effort is normal.     Breath sounds: No stridor. No wheezing, rhonchi or rales.  Abdominal:     General: Abdomen is flat. Bowel sounds are normal. There is no distension.     Palpations: Abdomen is soft. There is no hepatomegaly, splenomegaly or mass.  Tenderness: There is no abdominal tenderness.  Musculoskeletal:        General: Normal range of motion.     Cervical back: Neck supple.     Right lower leg: No edema.     Left lower leg: No edema.  Lymphadenopathy:     Cervical: No cervical adenopathy.  Skin:    General: Skin is warm and dry.  Neurological:     General: No focal deficit present.     Mental Status: She is alert.  Psychiatric:        Mood and Affect: Mood normal.    Lab Results  Component Value Date   WBC 9.6 05/11/2020   HGB 13.9 05/11/2020   HCT 41.3 05/11/2020   PLT 305 05/11/2020   GLUCOSE 89 05/28/2020    CHOL 175 05/15/2020   TRIG 115.0 05/15/2020   HDL 58.60 05/15/2020   LDLCALC 94 05/15/2020   ALT 28 02/12/2020   AST 25 02/12/2020   NA 140 05/28/2020   K 3.9 05/28/2020   CL 105 05/28/2020   CREATININE 0.98 05/28/2020   BUN 16 05/28/2020   CO2 30 05/28/2020   TSH 2.16 02/12/2020   HGBA1C 6.1 05/16/2020    CT CHEST HIGH RESOLUTION  Result Date: 11/03/2020 CLINICAL DATA:  60 year old female with history of cough and shortness of breath. History of COVID infection in February 2022. EXAM: CT CHEST WITHOUT CONTRAST TECHNIQUE: Multidetector CT imaging of the chest was performed following the standard protocol without intravenous contrast. High resolution imaging of the lungs, as well as inspiratory and expiratory imaging, was performed. COMPARISON:  No priors. FINDINGS: Cardiovascular: Heart size is normal. There is no significant pericardial fluid, thickening or pericardial calcification. No atherosclerotic calcifications in the thoracic aorta or the coronary arteries. Mediastinum/Nodes: No pathologically enlarged mediastinal or hilar lymph nodes. Please note that accurate exclusion of hilar adenopathy is limited on noncontrast CT scans. Esophagus is unremarkable in appearance. No axillary lymphadenopathy. Lungs/Pleura: There are some linear areas of architectural distortion and volume loss in the right middle lobe and inferior segment of the lingula, most likely to reflect areas of chronic post infectious or inflammatory scarring. No generalized regions of ground-glass attenuation, septal thickening, subpleural reticulation, traction bronchiectasis or honeycombing are otherwise noted. No acute consolidative scotch that inspiratory and expiratory imaging is unremarkable. No acute consolidative airspace disease. Upper Abdomen: Status post cholecystectomy. Musculoskeletal: There are no aggressive appearing lytic or blastic lesions noted in the visualized portions of the skeleton. IMPRESSION: 1. There are  some mild areas of post infectious scarring in the right middle lobe and inferior segment of the lingula. 2. No generalized findings to suggest interstitial lung disease. Electronically Signed   By: Vinnie Langton M.D.   On: 11/03/2020 10:32    Assessment & Plan:   Aamirah was seen today for hypertension.  Diagnoses and all orders for this visit:  Visit for screening mammogram -     MM DIGITAL SCREENING BILATERAL; Future  Essential hypertension, benign- She has not achieved her blood pressure goal of 130/80 but is also not willing to take indapamide.  Will continue the current dose of the ARB and I have asked her to continue working on her lifestyle modifications.  I have discontinued Connye Burkitt. [place]'s indapamide. I am also having her maintain her Cholecalciferol, montelukast, fluticasone, Edarbi, fluticasone furoate-vilanterol, and esomeprazole.  No orders of the defined types were placed in this encounter.    Follow-up: Return in about 6 months (around 05/15/2021).  Marcello Moores  Ronnald Ramp, MD

## 2020-11-12 NOTE — Patient Instructions (Signed)

## 2020-11-28 ENCOUNTER — Telehealth: Payer: Self-pay

## 2020-12-03 ENCOUNTER — Other Ambulatory Visit: Payer: Self-pay

## 2020-12-03 ENCOUNTER — Ambulatory Visit: Payer: Commercial Managed Care - PPO | Admitting: Pulmonary Disease

## 2020-12-03 ENCOUNTER — Encounter: Payer: Self-pay | Admitting: Pulmonary Disease

## 2020-12-03 ENCOUNTER — Ambulatory Visit (INDEPENDENT_AMBULATORY_CARE_PROVIDER_SITE_OTHER): Payer: Commercial Managed Care - PPO | Admitting: Pulmonary Disease

## 2020-12-03 VITALS — BP 140/92 | HR 82 | Temp 97.5°F | Ht 60.0 in | Wt 181.0 lb

## 2020-12-03 DIAGNOSIS — Z87891 Personal history of nicotine dependence: Secondary | ICD-10-CM | POA: Diagnosis not present

## 2020-12-03 DIAGNOSIS — Z8616 Personal history of COVID-19: Secondary | ICD-10-CM

## 2020-12-03 DIAGNOSIS — J45909 Unspecified asthma, uncomplicated: Secondary | ICD-10-CM

## 2020-12-03 DIAGNOSIS — R059 Cough, unspecified: Secondary | ICD-10-CM

## 2020-12-03 NOTE — Progress Notes (Signed)
Synopsis: Referred in July 2022 for cough by Janith Lima, MD  Subjective:   PATIENT ID: Paula Parker GENDER: female DOB: 03/19/1961, MRN: [id number]  Chief Complaint  Patient presents with   Follow-up    Patient did PFT today. No concerns    PMH GERD, hiatal hernia, HLD HTN, pre-diabetic, lived [place] , 2 years ago moved to [place], married, 3 dogs, three grandchildren. History of covid in Feb. She had fractured her knee (hospital and doc offices), mild symtpoms (5 days of illness and tired fro 1 weeks plus). Eventhough the spring time she felt SOB while working in her garden. 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 Chlorox causes her to cough.  Occasionally has symptoms at nighttime.  Cough is nonproductive.  Patient denies hemoptysis  OV 12/03/2020: Here today for follow-up after PFTs.  She had pulmonary function test completed prior to today's office visit.  PFTs revealed a reduced spirometry with an FEV1 of 57% predicted and an FVC of 59% predicted with a normal ratio.  Has a TLC of 84% and a DLCO of 100% predicted.  Her ERV is 50% predicted.  We discussed the results of this today is likely low bit of a mixed pattern with her known history of smoking and the current weight.  From respiratory standpoint she is not on any inhalers.  She is not been using any bronchodilators.  She is able to complete most of her activities of daily living and she feels better and has less cough from her previous slow recovery from [place].   Past Medical History:  Diagnosis Date   Fatty liver    Gallstones    GERD (gastroesophageal reflux disease)    GLAUCOMA, BORDERLINE    Hiatal hernia    HTN (hypertension)    HYPERLIPIDEMIA    HYPERTENSION    PALPITATIONS, OCCASIONAL    Pseudomembranous colitis    SMOKER      Family History  Problem Relation Age of Onset   Ovarian cancer Sister 64   Heart disease Sister    Early death Sister    ALS Mother    Heart  disease Father    Coronary artery disease Other        female first degree <50   Diabetes Maternal Grandfather    Diabetes Maternal Grandmother    Stroke Neg Hx    Kidney disease Neg Hx    Hypertension Neg Hx    Hyperlipidemia Neg Hx    Osteoporosis Neg Hx      Past Surgical History:  Procedure Laterality Date   CERVIX LESION DESTRUCTION  1980'S   CHOLECYSTECTOMY N/A 02/03/2018   Procedure: LAPAROSCOPIC CHOLECYSTECTOMY;  Surgeon: Ileana Roup, MD;  Location: [facility];  Service: General;  Laterality: N/A;   CHOLECYSTECTOMY     COLONOSCOPY     ESOPHAGOGASTRODUODENOSCOPY     OVARY SURGERY     removed precancerous cells with a laser    Social History   Socioeconomic History   Marital status: Married    Spouse name: Not on file   Number of children: 1   Years of education: Not on file   Highest education level: Not on file  Occupational History    Employer: [employer]  Tobacco Use   Smoking status: Former    Packs/day: 1.00    Types: Cigarettes    Quit date: 04/27/2014    Years since quitting: 6.6   Smokeless tobacco: Never  Vaping Use   Vaping Use: Never used  Substance and Sexual Activity   Alcohol use: No    Alcohol/week: 0.0 standard drinks   Drug use: No   Sexual activity: Not Currently  Other Topics Concern   Not on file  Social History Narrative   Married 1 grown daughter, environmental services tech at [employer]   Former smoker no alcohol no tobacco now no drug use.  1-2 caffeinated beverages daily.   Social Determinants of Health   Financial Resource Strain: Not on file  Food Insecurity: Not on file  Transportation Needs: Not on file  Physical Activity: Not on file  Stress: Not on file  Social Connections: Not on file  Intimate Partner Violence: Not on file     Allergies  Allergen Reactions   Doxycycline Other (See Comments)    Throat swelling   Fish Allergy     Pt ate flounder and calves swelled   Benzonatate Rash and Other (See  Comments)   Penicillins Rash    Has patient had a PCN reaction causing immediate rash, facial/tongue/throat swelling, SOB or lightheadedness with hypotension: Yes Has patient had a PCN reaction causing severe rash involving mucus membranes or skin necrosis: No Has patient had a PCN reaction that required hospitalization: No Has patient had a PCN reaction occurring within the last 10 years: Yes If all of the above answers are "NO", then may proceed with Cephalosporin use.     Sulfonamide Derivatives Rash and Other (See Comments)    unknown     Outpatient Medications Prior to Visit  Medication Sig Dispense Refill   Cholecalciferol 50 MCG (2000 UT) TABS Take 1 tablet (2,000 Units total) by mouth daily. 90 tablet 1   EDARBI 40 MG TABS TAKE 1 TABLET EACH DAY. 30 tablet 5   esomeprazole (NEXIUM) 40 MG capsule TAKE 1 CAPSULE (40 MG TOTAL) BY MOUTH DAILY AT 12 NOON. 90 capsule 1   montelukast (SINGULAIR) 10 MG tablet Take 1 tablet (10 mg total) by mouth at bedtime. 30 tablet 3   fluticasone (FLOVENT HFA) 110 MCG/ACT inhaler Inhale 1 puff into the lungs in the morning and at bedtime. (Patient not taking: No sig reported) 3 each 1   fluticasone furoate-vilanterol (BREO ELLIPTA) 200-25 MCG/INH AEPB Inhale 1 puff into the lungs daily. (Patient not taking: No sig reported) 28 each 0   No facility-administered medications prior to visit.    Review of Systems  Constitutional:  Negative for chills, fever, malaise/fatigue and weight loss.  HENT:  Negative for hearing loss, sore throat and tinnitus.   Eyes:  Negative for blurred vision and double vision.  Respiratory:  Positive for cough and shortness of breath. Negative for hemoptysis, sputum production, wheezing and stridor.   Cardiovascular:  Negative for chest pain, palpitations, orthopnea, leg swelling and PND.  Gastrointestinal:  Negative for abdominal pain, constipation, diarrhea, heartburn, nausea and vomiting.  Genitourinary:  Negative for  dysuria, hematuria and urgency.  Musculoskeletal:  Negative for joint pain and myalgias.  Skin:  Negative for itching and rash.  Neurological:  Negative for dizziness, tingling, weakness and headaches.  Endo/Heme/Allergies:  Negative for environmental allergies. Does not bruise/bleed easily.  Psychiatric/Behavioral:  Negative for depression. The patient is not nervous/anxious and does not have insomnia.   All other systems reviewed and are negative.   Objective:  Physical Exam Vitals reviewed.  Constitutional:      General: She is not in acute distress.    Appearance: She is well-developed.  She is obese.  HENT:     Head: Normocephalic and atraumatic.  Eyes:     General: No scleral icterus.    Conjunctiva/sclera: Conjunctivae normal.     Pupils: Pupils are equal, round, and reactive to light.  Neck:     Vascular: No JVD.     Trachea: No tracheal deviation.  Cardiovascular:     Rate and Rhythm: Normal rate and regular rhythm.     Heart sounds: Normal heart sounds. No murmur heard. Pulmonary:     Effort: Pulmonary effort is normal. No tachypnea, accessory muscle usage or respiratory distress.     Breath sounds: No stridor. No wheezing, rhonchi or rales.  Abdominal:     General: Bowel sounds are normal. There is no distension.     Palpations: Abdomen is soft.     Tenderness: There is no abdominal tenderness.  Musculoskeletal:        General: No tenderness.     Cervical back: Neck supple.  Lymphadenopathy:     Cervical: No cervical adenopathy.  Skin:    General: Skin is warm and dry.     Capillary Refill: Capillary refill takes less than 2 seconds.     Findings: No rash.  Neurological:     Mental Status: She is alert and oriented to person, place, and time.  Psychiatric:        Behavior: Behavior normal.     Vitals:   12/03/20 1106  BP: (!) 140/92  Pulse: 82  Temp: (!) 97.5 F (36.4 C)  TempSrc: Oral  SpO2: 97%  Weight: 181 lb (82.1 kg)  Height: 5' (1.524 m)    97% on RA BMI Readings from Last 3 Encounters:  12/03/20 35.35 kg/m  11/12/20 34.96 kg/m  10/15/20 35.74 kg/m   Wt Readings from Last 3 Encounters:  12/03/20 181 lb (82.1 kg)  11/12/20 179 lb (81.2 kg)  10/15/20 183 lb (83 kg)     CBC    Component Value Date/Time   WBC 9.6 05/11/2020 1951   RBC 4.43 05/11/2020 1951   HGB 13.9 05/11/2020 1951   HCT 41.3 05/11/2020 1951   PLT 305 05/11/2020 1951   MCV 93.2 05/11/2020 1951   MCH 31.4 05/11/2020 1951   MCHC 33.7 05/11/2020 1951   RDW 13.1 05/11/2020 1951   LYMPHSABS 1.7 02/12/2020 0930   MONOABS 0.5 02/12/2020 0930   EOSABS 0.2 02/12/2020 0930   BASOSABS 0.0 02/12/2020 0930     Chest Imaging: Chest x-ray 08/12/2020: Low lung volumes central airway thickening bilateral lung bases with interstitial markings, possible postinflammatory scarring related to her history of COVID no effusion The patient's images have been independently reviewed by me.  .  11/03/2020: HRCT mild scariing. I suspect left over from covid. No other acute finding.  The patient's images have been independently reviewed by me.    Pulmonary Functions Testing Results: No flowsheet data found.  FeNO: na  Pathology: na  Echocardiogram: na  Heart Catheterization: na    Assessment & Plan:     ICD-10-CM   1. Cough  R05.9     2. Mild reactive airways disease, unspecified whether persistent  J45.909     3. History of COVID-19  Z86.16       Discussion:  This is a 60 year old female, seen initially for persistent cough and reactive airway symptoms after COVID in March.  She is doing much better since then.  CT scan of the chest was reviewed today in the office which is  reassuring with no really significant parenchymal findings.  Plan: Reviewed PFTs today, likely has a mixed pattern.  She does have maybe mild obstruction with reduced spirometry.  Her TLC is normal also no definite restriction but may be mixed pattern related to weight and  43-pack-year history.  I do think that she needs at least a repeat PFTs in the future.  I like to make sure that her FEV1 does not change.  Her respiratory symptoms are stable at this time.  She wants to follow-up with her primary care doctor regarding this. Would recommend repeat PFTs in 2 years.  We are happy to see her after this timeframe if needed.   Current Outpatient Medications:    Cholecalciferol 50 MCG (2000 UT) TABS, Take 1 tablet (2,000 Units total) by mouth daily., Disp: 90 tablet, Rfl: 1   EDARBI 40 MG TABS, TAKE 1 TABLET EACH DAY., Disp: 30 tablet, Rfl: 5   esomeprazole (NEXIUM) 40 MG capsule, TAKE 1 CAPSULE (40 MG TOTAL) BY MOUTH DAILY AT 12 NOON., Disp: 90 capsule, Rfl: 1   montelukast (SINGULAIR) 10 MG tablet, Take 1 tablet (10 mg total) by mouth at bedtime., Disp: 30 tablet, Rfl: 3   fluticasone (FLOVENT HFA) 110 MCG/ACT inhaler, Inhale 1 puff into the lungs in the morning and at bedtime. (Patient not taking: No sig reported), Disp: 3 each, Rfl: 1   fluticasone furoate-vilanterol (BREO ELLIPTA) 200-25 MCG/INH AEPB, Inhale 1 puff into the lungs daily. (Patient not taking: No sig reported), Disp: 28 each, Rfl: 0   Garner Nash, DO [facility] 12/03/2020 11:17 AM

## 2020-12-03 NOTE — Patient Instructions (Signed)
Full PFT performed today. °

## 2020-12-03 NOTE — Progress Notes (Signed)
Full PFT performed today. °

## 2020-12-03 NOTE — Patient Instructions (Signed)
Thank you for visiting Dr. Valeta Harms at [facility]. Today we recommend the following:  Consider repeat PFTs in 2 years   Return if symptoms worsen or fail to improve.  Can see [place] in 2 years or if needed before then.   Please do your part to reduce the spread of COVID-19.

## 2020-12-12 LAB — PULMONARY FUNCTION TEST
DL/VA % pred: 125 %
DL/VA: 5.46 ml/min/mmHg/L
DLCO cor % pred: 100 %
DLCO cor: 17.69 ml/min/mmHg
DLCO unc % pred: 100 %
DLCO unc: 17.69 ml/min/mmHg
FEF 25-75 Post: 0.9 L/sec
FEF 25-75 Pre: 1.28 L/sec
FEF2575-%Change-Post: -29 %
FEF2575-%Pred-Post: 41 %
FEF2575-%Pred-Pre: 59 %
FEV1-%Change-Post: -8 %
FEV1-%Pred-Post: 59 %
FEV1-%Pred-Pre: 64 %
FEV1-Post: 1.32 L
FEV1-Pre: 1.43 L
FEV1FVC-%Change-Post: 2 %
FEV1FVC-%Pred-Pre: 101 %
FEV6-%Change-Post: -9 %
FEV6-%Pred-Post: 59 %
FEV6-%Pred-Pre: 65 %
FEV6-Post: 1.63 L
FEV6-Pre: 1.81 L
FEV6FVC-%Pred-Post: 103 %
FEV6FVC-%Pred-Pre: 103 %
FVC-%Change-Post: -9 %
FVC-%Pred-Post: 57 %
FVC-%Pred-Pre: 63 %
FVC-Post: 1.63 L
FVC-Pre: 1.81 L
Post FEV1/FVC ratio: 81 %
Post FEV6/FVC ratio: 100 %
Pre FEV1/FVC ratio: 79 %
Pre FEV6/FVC Ratio: 100 %
RV % pred: 104 %
RV: 1.84 L
TLC % pred: 84 %
TLC: 3.76 L

## 2020-12-23 ENCOUNTER — Encounter: Payer: Self-pay | Admitting: Internal Medicine

## 2020-12-23 NOTE — Telephone Encounter (Signed)
Key: [id number]

## 2020-12-25 ENCOUNTER — Encounter: Payer: Self-pay | Admitting: Internal Medicine

## 2021-01-20 ENCOUNTER — Other Ambulatory Visit (HOSPITAL_COMMUNITY): Payer: Self-pay

## 2021-01-28 ENCOUNTER — Ambulatory Visit: Payer: Commercial Managed Care - PPO

## 2021-01-28 ENCOUNTER — Ambulatory Visit
Admission: RE | Admit: 2021-01-28 | Discharge: 2021-01-28 | Disposition: A | Discharge status: Home / Self Care | Payer: Commercial Managed Care - PPO | Source: Ambulatory Visit | Attending: Internal Medicine | Admitting: Internal Medicine

## 2021-01-28 ENCOUNTER — Other Ambulatory Visit: Payer: Self-pay

## 2021-01-28 DIAGNOSIS — Z1231 Encounter for screening mammogram for malignant neoplasm of breast: Secondary | ICD-10-CM

## 2021-02-25 ENCOUNTER — Other Ambulatory Visit (HOSPITAL_COMMUNITY): Payer: Self-pay

## 2021-02-25 ENCOUNTER — Ambulatory Visit: Payer: Commercial Managed Care - PPO

## 2021-02-25 ENCOUNTER — Other Ambulatory Visit: Payer: Self-pay | Admitting: Internal Medicine

## 2021-02-25 DIAGNOSIS — I10 Essential (primary) hypertension: Secondary | ICD-10-CM

## 2021-02-25 MED ORDER — OLMESARTAN MEDOXOMIL 20 MG PO TABS
20.0000 mg | ORAL_TABLET | Freq: Every day | ORAL | 0 refills | Status: DC
  Filled 2021-02-25: qty 90, 90d supply, fill #0

## 2021-02-25 NOTE — Telephone Encounter (Signed)
Patient came by the office to inquire about the PA for Edarbi. If PA is unable to be approved, can a alternative be sent in.  Requesting a follow up.

## 2021-03-02 ENCOUNTER — Other Ambulatory Visit (HOSPITAL_COMMUNITY): Payer: Self-pay

## 2021-03-03 ENCOUNTER — Other Ambulatory Visit: Payer: Self-pay | Admitting: Internal Medicine

## 2021-03-03 ENCOUNTER — Encounter: Payer: Self-pay | Admitting: Internal Medicine

## 2021-03-03 ENCOUNTER — Other Ambulatory Visit (HOSPITAL_COMMUNITY): Payer: Self-pay

## 2021-03-03 DIAGNOSIS — J208 Acute bronchitis due to other specified organisms: Secondary | ICD-10-CM | POA: Insufficient documentation

## 2021-03-03 MED ORDER — HYDROCODONE BIT-HOMATROP MBR 5-1.5 MG/5ML PO SOLN
5.0000 mL | Freq: Three times a day (TID) | ORAL | 0 refills | Status: AC | PRN
  Filled 2021-03-03: qty 120, 8d supply, fill #0

## 2021-04-22 ENCOUNTER — Other Ambulatory Visit: Payer: Self-pay | Admitting: Internal Medicine

## 2021-04-22 ENCOUNTER — Other Ambulatory Visit (HOSPITAL_COMMUNITY): Payer: Self-pay

## 2021-04-22 DIAGNOSIS — K219 Gastro-esophageal reflux disease without esophagitis: Secondary | ICD-10-CM

## 2021-04-22 MED ORDER — ESOMEPRAZOLE MAGNESIUM 40 MG PO CPDR
40.0000 mg | DELAYED_RELEASE_CAPSULE | Freq: Every day | ORAL | 1 refills | Status: DC
  Filled 2021-04-22: qty 90, 90d supply, fill #0
  Filled 2021-07-21: qty 90, 90d supply, fill #1

## 2021-05-20 ENCOUNTER — Ambulatory Visit: Payer: Commercial Managed Care - PPO | Admitting: Internal Medicine

## 2021-05-27 ENCOUNTER — Ambulatory Visit: Payer: Commercial Managed Care - PPO | Admitting: Internal Medicine

## 2021-05-27 ENCOUNTER — Other Ambulatory Visit (HOSPITAL_COMMUNITY): Payer: Self-pay

## 2021-05-27 ENCOUNTER — Other Ambulatory Visit: Payer: Self-pay

## 2021-05-27 ENCOUNTER — Encounter: Payer: Self-pay | Admitting: Internal Medicine

## 2021-05-27 VITALS — BP 144/92 | HR 84 | Temp 98.1°F | Resp 16 | Ht 60.0 in | Wt 178.0 lb

## 2021-05-27 DIAGNOSIS — Z0000 Encounter for general adult medical examination without abnormal findings: Secondary | ICD-10-CM

## 2021-05-27 DIAGNOSIS — K7581 Nonalcoholic steatohepatitis (NASH): Secondary | ICD-10-CM

## 2021-05-27 DIAGNOSIS — I10 Essential (primary) hypertension: Secondary | ICD-10-CM

## 2021-05-27 DIAGNOSIS — N1832 Chronic kidney disease, stage 3b: Secondary | ICD-10-CM

## 2021-05-27 DIAGNOSIS — R7303 Prediabetes: Secondary | ICD-10-CM

## 2021-05-27 LAB — LIPID PANEL
Cholesterol: 184 mg/dL (ref 0–200)
HDL: 59.9 mg/dL (ref >39.00)
LDL Cholesterol: 112 mg/dL — ABNORMAL HIGH (ref 0–99)
NonHDL: 124.32
Total CHOL/HDL Ratio: 3
Triglycerides: 64 mg/dL (ref 0.0–149.0)
VLDL: 12.8 mg/dL (ref 0.0–40.0)

## 2021-05-27 LAB — URINALYSIS, ROUTINE W REFLEX MICROSCOPIC
Bilirubin Urine: NEGATIVE
Ketones, ur: NEGATIVE
Nitrite: NEGATIVE
Specific Gravity, Urine: >=1.03 — AB (ref 1.000–1.030)
Total Protein, Urine: NEGATIVE
Urine Glucose: NEGATIVE
Urobilinogen, UA: 0.2 (ref 0.0–1.0)
pH: 5.5 (ref 5.0–8.0)

## 2021-05-27 LAB — CBC WITH DIFFERENTIAL/PLATELET
Basophils Absolute: 0.1 10*3/uL (ref 0.0–0.1)
Basophils Relative: 1.2 % (ref 0.0–3.0)
Eosinophils Absolute: 0.2 10*3/uL (ref 0.0–0.7)
Eosinophils Relative: 3.7 % (ref 0.0–5.0)
HCT: 43.8 % (ref 36.0–46.0)
Hemoglobin: 14.2 g/dL (ref 12.0–15.0)
Lymphocytes Relative: 34.1 % (ref 12.0–46.0)
Lymphs Abs: 1.4 10*3/uL (ref 0.7–4.0)
MCHC: 32.5 g/dL (ref 30.0–36.0)
MCV: 96.2 fl (ref 78.0–100.0)
Monocytes Absolute: 0.4 10*3/uL (ref 0.1–1.0)
Monocytes Relative: 8.5 % (ref 3.0–12.0)
Neutro Abs: 2.2 10*3/uL (ref 1.4–7.7)
Neutrophils Relative %: 52.5 % (ref 43.0–77.0)
Platelets: 257 10*3/uL (ref 150.0–400.0)
RBC: 4.55 Mil/uL (ref 3.87–5.11)
RDW: 13.2 % (ref 11.5–15.5)
WBC: 4.2 10*3/uL (ref 4.0–10.5)

## 2021-05-27 LAB — BASIC METABOLIC PANEL
BUN: 17 mg/dL (ref 6–23)
CO2: 32 mEq/L (ref 19–32)
Calcium: 9.5 mg/dL (ref 8.4–10.5)
Chloride: 105 mEq/L (ref 96–112)
Creatinine, Ser: 0.9 mg/dL (ref 0.40–1.20)
GFR: 69.88 mL/min (ref >60.00)
Glucose, Bld: 106 mg/dL — ABNORMAL HIGH (ref 70–99)
Potassium: 4.2 mEq/L (ref 3.5–5.1)
Sodium: 141 mEq/L (ref 135–145)

## 2021-05-27 LAB — HEPATIC FUNCTION PANEL
ALT: 24 U/L (ref 0–35)
AST: 24 U/L (ref 0–37)
Albumin: 4.4 g/dL (ref 3.5–5.2)
Alkaline Phosphatase: 62 U/L (ref 39–117)
Bilirubin, Direct: 0.1 mg/dL (ref 0.0–0.3)
Total Bilirubin: 0.4 mg/dL (ref 0.2–1.2)
Total Protein: 6.9 g/dL (ref 6.0–8.3)

## 2021-05-27 LAB — HEMOGLOBIN A1C: Hgb A1c MFr Bld: 6 % (ref 4.6–6.5)

## 2021-05-27 LAB — PROTIME-INR
INR: 1 ratio (ref 0.8–1.0)
Prothrombin Time: 10.6 s (ref 9.6–13.1)

## 2021-05-27 LAB — TSH: TSH: 2.78 u[IU]/mL (ref 0.35–5.50)

## 2021-05-27 MED ORDER — OLMESARTAN MEDOXOMIL 40 MG PO TABS
40.0000 mg | ORAL_TABLET | Freq: Every day | ORAL | 1 refills | Status: DC
  Filled 2021-05-27: qty 90, 90d supply, fill #0
  Filled 2021-08-25: qty 90, 90d supply, fill #1

## 2021-05-27 NOTE — Patient Instructions (Signed)

## 2021-05-27 NOTE — Progress Notes (Signed)
Subjective:  Patient ID: Paula Parker, female    DOB: Jun 19, 1960  Age: 61 y.o. MRN: [id number]  CC: Annual Exam, Hypertension, and Hyperlipidemia  This visit occurred during the SARS-CoV-2 public health emergency.  Safety protocols were in place, including screening questions prior to the visit, additional usage of staff PPE, and extensive cleaning of exam room while observing appropriate contact time as indicated for disinfecting solutions.    HPI Paula Parker presents for a CPX and f/up -   She is active and denies chest pain, shortness of breath, dyspnea on exertion, diaphoresis, or edema.  Outpatient Medications Prior to Visit  Medication Sig Dispense Refill   Cholecalciferol 50 MCG (2000 UT) TABS Take 1 tablet (2,000 Units total) by mouth daily. 90 tablet 1   esomeprazole (NEXIUM) 40 MG capsule Take 1 capsule (40 mg total) by mouth daily at 12 noon. 90 capsule 1   fluticasone (FLOVENT HFA) 110 MCG/ACT inhaler Inhale 1 puff into the lungs in the morning and at bedtime. 3 each 1   fluticasone furoate-vilanterol (BREO ELLIPTA) 200-25 MCG/INH AEPB Inhale 1 puff into the lungs daily. 28 each 0   montelukast (SINGULAIR) 10 MG tablet Take 1 tablet (10 mg total) by mouth at bedtime. 30 tablet 3   olmesartan (BENICAR) 20 MG tablet Take 1 tablet (20 mg total) by mouth daily. 90 tablet 0   No facility-administered medications prior to visit.    ROS Review of Systems  Constitutional:  Negative for diaphoresis, fatigue and unexpected weight change.  HENT: Negative.    Eyes: Negative.   Respiratory:  Negative for cough, chest tightness, shortness of breath and wheezing.   Cardiovascular:  Negative for chest pain and palpitations.  Gastrointestinal:  Negative for abdominal pain, diarrhea, nausea and vomiting.  Endocrine: Negative.   Genitourinary: Negative.  Negative for difficulty urinating, dysuria and hematuria.  Musculoskeletal:  Negative for arthralgias and myalgias.  Skin:  Negative.   Neurological:  Negative for dizziness, weakness and light-headedness.  Hematological:  Negative for adenopathy. Does not bruise/bleed easily.  Psychiatric/Behavioral: Negative.     Objective:  BP (!) 144/92 (BP Location: Left Arm, Patient Position: Sitting, Cuff Size: Large) Comment: thigh cuff (R) 144/96 (L) 142/92   Pulse 84    Temp 98.1 F (36.7 C) (Oral)    Resp 16    Ht 5' (1.524 m)    Wt 178 lb (80.7 kg)    SpO2 97%    BMI 34.76 kg/m   BP Readings from Last 3 Encounters:  05/27/21 (!) 144/92  12/03/20 (!) 140/92  11/12/20 136/84    Wt Readings from Last 3 Encounters:  05/27/21 178 lb (80.7 kg)  12/03/20 181 lb (82.1 kg)  11/12/20 179 lb (81.2 kg)    Physical Exam Vitals reviewed.  Constitutional:      Appearance: Normal appearance.  HENT:     Nose: Nose normal.     Mouth/Throat:     Mouth: Mucous membranes are moist.  Eyes:     General: No scleral icterus.    Conjunctiva/sclera: Conjunctivae normal.  Cardiovascular:     Rate and Rhythm: Normal rate and regular rhythm.     Heart sounds: No murmur heard. Pulmonary:     Effort: Pulmonary effort is normal.     Breath sounds: No stridor. No wheezing, rhonchi or rales.  Abdominal:     General: Abdomen is flat.     Palpations: There is no mass.     Tenderness: There is  no abdominal tenderness. There is no guarding or rebound.     Hernia: No hernia is present.  Musculoskeletal:        General: No swelling.     Cervical back: Neck supple.     Right lower leg: No edema.     Left lower leg: No edema.  Lymphadenopathy:     Cervical: No cervical adenopathy.  Skin:    General: Skin is warm and dry.     Coloration: Skin is not pale.  Neurological:     General: No focal deficit present.     Mental Status: She is alert. Mental status is at baseline.  Psychiatric:        Mood and Affect: Mood normal.        Behavior: Behavior normal.    Lab Results  Component Value Date   WBC 4.2 05/27/2021   HGB 14.2  05/27/2021   HCT 43.8 05/27/2021   PLT 257.0 05/27/2021   GLUCOSE 106 (H) 05/27/2021   CHOL 184 05/27/2021   TRIG 64.0 05/27/2021   HDL 59.90 05/27/2021   LDLCALC 112 (H) 05/27/2021   ALT 24 05/27/2021   AST 24 05/27/2021   NA 141 05/27/2021   K 4.2 05/27/2021   CL 105 05/27/2021   CREATININE 0.90 05/27/2021   BUN 17 05/27/2021   CO2 32 05/27/2021   TSH 2.78 05/27/2021   INR 1.0 05/27/2021   HGBA1C 6.0 05/27/2021    MM 3D SCREEN BREAST BILATERAL  Result Date: 01/30/2021 CLINICAL DATA:  Screening. EXAM: DIGITAL SCREENING BILATERAL MAMMOGRAM WITH TOMOSYNTHESIS AND CAD TECHNIQUE: Bilateral screening digital craniocaudal and mediolateral oblique mammograms were obtained. Bilateral screening digital breast tomosynthesis was performed. The images were evaluated with computer-aided detection. COMPARISON:  Previous exam(s). ACR Breast Density Category b: There are scattered areas of fibroglandular density. FINDINGS: There are no findings suspicious for malignancy. IMPRESSION: No mammographic evidence of malignancy. A result letter of this screening mammogram will be mailed directly to the patient. RECOMMENDATION: Screening mammogram in one year. (Code:SM-B-01Y) BI-RADS CATEGORY  1: Negative. Electronically Signed   By: Abelardo Diesel M.D.   On: 01/30/2021 08:43    Assessment & Plan:   Miriana was seen today for annual exam, hypertension and hyperlipidemia.  Diagnoses and all orders for this visit:  Essential hypertension, benign- Her blood pressure is not adequately well controlled.  Will double the dose of the ARB. -     Basic metabolic panel; Future -     TSH; Future -     Urinalysis, Routine w reflex microscopic; Future -     CBC with Differential/Platelet; Future -     olmesartan (BENICAR) 40 MG tablet; Take 1 tablet (40 mg total) by mouth daily. -     CBC with Differential/Platelet -     Urinalysis, Routine w reflex microscopic -     TSH -     Basic metabolic panel  Nonalcoholic  steatohepatitis ([facility])- Marked improvement noted. -     Protime-INR; Future -     Hepatic function panel; Future -     Hepatic function panel -     Protime-INR  Chronic renal impairment, stage 3b ([place])- Her renal function has improved. -     Urinalysis, Routine w reflex microscopic; Future -     CBC with Differential/Platelet; Future -     CBC with Differential/Platelet -     Urinalysis, Routine w reflex microscopic  Prediabetes- Her A1c is down to 6.0%. -  Basic metabolic panel; Future -     Hemoglobin A1c; Future -     Hemoglobin A1c -     Basic metabolic panel  Routine general medical examination at a health care facility- Exam completed, labs reviewed; statin therapy is not indicated, vaccines reviewed; she refused a pneumonia vaccine and shingles vaccine, cancer screenings are up-to-date, patient education was given. -     Lipid panel; Future -     Lipid panel   I have discontinued Connye Burkitt. Touch's olmesartan. I am also having her start on olmesartan. Additionally, I am having her maintain her Cholecalciferol, montelukast, fluticasone, fluticasone furoate-vilanterol, and esomeprazole.  Meds ordered this encounter  Medications   olmesartan (BENICAR) 40 MG tablet    Sig: Take 1 tablet (40 mg total) by mouth daily.    Dispense:  90 tablet    Refill:  1     Follow-up: Return in about 6 months (around 11/24/2021).  Scarlette Calico, MD

## 2021-05-28 ENCOUNTER — Encounter: Payer: Self-pay | Admitting: Internal Medicine

## 2021-05-29 ENCOUNTER — Encounter: Payer: Self-pay | Admitting: Internal Medicine

## 2021-07-21 ENCOUNTER — Other Ambulatory Visit: Payer: Self-pay

## 2021-07-21 ENCOUNTER — Other Ambulatory Visit (HOSPITAL_COMMUNITY): Payer: Self-pay

## 2021-08-25 ENCOUNTER — Other Ambulatory Visit (HOSPITAL_COMMUNITY): Payer: Self-pay

## 2021-10-15 ENCOUNTER — Encounter: Payer: Self-pay | Admitting: Internal Medicine

## 2021-10-19 ENCOUNTER — Other Ambulatory Visit (HOSPITAL_COMMUNITY): Payer: Self-pay

## 2021-10-19 ENCOUNTER — Other Ambulatory Visit: Payer: Self-pay | Admitting: Internal Medicine

## 2021-10-19 DIAGNOSIS — K219 Gastro-esophageal reflux disease without esophagitis: Secondary | ICD-10-CM

## 2021-10-19 MED ORDER — ESOMEPRAZOLE MAGNESIUM 40 MG PO CPDR
40.0000 mg | DELAYED_RELEASE_CAPSULE | Freq: Every day | ORAL | 1 refills | Status: DC
  Filled 2021-10-19: qty 90, 90d supply, fill #0
  Filled 2022-01-11: qty 90, 90d supply, fill #1

## 2021-10-20 ENCOUNTER — Encounter: Payer: Self-pay | Admitting: Internal Medicine

## 2021-11-03 NOTE — Progress Notes (Unsigned)
   Paula Parker Jul 31, 1960 [id number]   History:  61 y.o. [id number] presents for annual exam. Postmenopausal - no HRT, no bleeding. Normal pap history. HTN, HLD, pre-diabetes, CKD 3 managed by PCP. Osteoporosis.  Gynecologic History No LMP recorded. Patient is postmenopausal.   Contraception/Family planning: abstinence and post menopausal status Sexually active: No  Health Maintenance Last Pap: 07/01/2016. Results were: Normal, 5-year repeat Last mammogram: 01/28/2021. Results were: Normal Last colonoscopy: 09/27/2019. Results were: Benign polyps, 5-year recall Last Dexa: 04/19/2019. Results were: T-score -3.3  Past medical history, past surgical history, family history and social history were all reviewed and documented in the EPIC chart. Married. Sister with ovarian cancer at age 55, mother deceased from [place].   ROS:  A ROS was performed and pertinent positives and negatives are included.  Exam:  There were no vitals filed for this visit. There is no height or weight on file to calculate BMI.  General appearance:  Normal Thyroid:  Symmetrical, normal in size, without palpable masses or nodularity. Respiratory  Auscultation:  Clear without wheezing or rhonchi Cardiovascular  Auscultation:  Regular rate, without rubs, murmurs or gallops  Edema/varicosities:  Not grossly evident Abdominal  Soft,nontender, without masses, guarding or rebound.  Liver/spleen:  No organomegaly noted  Hernia:  None appreciated  Skin  Inspection:  Grossly normal Breasts: Examined lying and sitting.   Right: Without masses, retractions, nipple discharge or axillary adenopathy.   Left: Without masses, retractions, nipple discharge or axillary adenopathy. Genitourinary   Inguinal/mons:  Normal without inguinal adenopathy  External genitalia:  Normal appearing vulva with no masses, tenderness, or lesions  BUS/Urethra/Skene's glands:  Normal  Vagina:  Normal appearing with normal color and discharge,  no lesions  Cervix:  Normal appearing without discharge or lesions  Uterus:  Normal in size, shape and contour.  Midline and mobile, nontender  Adnexa/parametria:     Rt: Normal in size, without masses or tenderness.   Lt: Normal in size, without masses or tenderness.  Anus and perineum: Normal  Digital rectal exam: Normal sphincter tone without palpated masses or tenderness  Patient informed chaperone available to be present for breast and pelvic exam. Patient has requested no chaperone to be present. Patient has been advised what will be completed during breast and pelvic exam.   Assessment/Plan:  61 y.o. [id number] for annual exam.    Return in 1 year for annual.   Tamela Gammon DNP, 2:56 PM 11/03/2021

## 2021-11-04 ENCOUNTER — Other Ambulatory Visit (HOSPITAL_COMMUNITY)
Admission: RE | Admit: 2021-11-04 | Discharge: 2021-11-04 | Disposition: A | Discharge status: Home / Self Care | Payer: Commercial Managed Care - PPO | Source: Ambulatory Visit | Attending: Nurse Practitioner | Admitting: Nurse Practitioner

## 2021-11-04 ENCOUNTER — Telehealth: Payer: Self-pay | Admitting: *Deleted

## 2021-11-04 ENCOUNTER — Other Ambulatory Visit (HOSPITAL_COMMUNITY): Payer: Self-pay

## 2021-11-04 ENCOUNTER — Ambulatory Visit (INDEPENDENT_AMBULATORY_CARE_PROVIDER_SITE_OTHER): Payer: Commercial Managed Care - PPO | Admitting: Nurse Practitioner

## 2021-11-04 ENCOUNTER — Encounter: Payer: Self-pay | Admitting: Nurse Practitioner

## 2021-11-04 VITALS — BP 142/84 | HR 69 | Ht 61.0 in | Wt 177.0 lb

## 2021-11-04 DIAGNOSIS — Z780 Asymptomatic menopausal state: Secondary | ICD-10-CM

## 2021-11-04 DIAGNOSIS — Z01419 Encounter for gynecological examination (general) (routine) without abnormal findings: Secondary | ICD-10-CM | POA: Diagnosis present

## 2021-11-04 DIAGNOSIS — M810 Age-related osteoporosis without current pathological fracture: Secondary | ICD-10-CM | POA: Diagnosis not present

## 2021-11-04 DIAGNOSIS — N9089 Other specified noninflammatory disorders of vulva and perineum: Secondary | ICD-10-CM

## 2021-11-04 DIAGNOSIS — L280 Lichen simplex chronicus: Secondary | ICD-10-CM

## 2021-11-04 DIAGNOSIS — N6341 Unspecified lump in right breast, subareolar: Secondary | ICD-10-CM

## 2021-11-04 LAB — WET PREP FOR TRICH, YEAST, CLUE

## 2021-11-04 MED ORDER — CLOBETASOL PROPIONATE 0.05 % EX OINT
1.0000 | TOPICAL_OINTMENT | CUTANEOUS | 2 refills | Status: DC
  Filled 2021-11-04: qty 60, 30d supply, fill #0
  Filled 2022-04-12: qty 60, 30d supply, fill #1

## 2021-11-04 NOTE — Telephone Encounter (Signed)
Patient scheduled at the breast center on 11/10/21 @ 10:10am

## 2021-11-04 NOTE — Telephone Encounter (Signed)
-----   Message from Tamela Gammon, NP sent at 11/04/2021  8:35 AM EDT ----- Regarding: Right breast ultrasound Please send referral for right breast ultrasound for lump and pain. Thanks.

## 2021-11-05 ENCOUNTER — Other Ambulatory Visit (HOSPITAL_COMMUNITY): Payer: Self-pay

## 2021-11-05 LAB — CYTOLOGY - PAP
Comment: NEGATIVE
Diagnosis: NEGATIVE
High risk HPV: NEGATIVE

## 2021-11-05 NOTE — Telephone Encounter (Signed)
Patient read my chart message

## 2021-11-10 ENCOUNTER — Other Ambulatory Visit: Payer: Self-pay | Admitting: Nurse Practitioner

## 2021-11-10 ENCOUNTER — Ambulatory Visit
Admission: RE | Admit: 2021-11-10 | Discharge: 2021-11-10 | Disposition: A | Discharge status: Home / Self Care | Payer: Commercial Managed Care - PPO | Source: Ambulatory Visit | Attending: Nurse Practitioner | Admitting: Nurse Practitioner

## 2021-11-10 ENCOUNTER — Other Ambulatory Visit (HOSPITAL_COMMUNITY): Payer: Self-pay

## 2021-11-10 ENCOUNTER — Other Ambulatory Visit: Payer: Self-pay | Admitting: Internal Medicine

## 2021-11-10 DIAGNOSIS — I10 Essential (primary) hypertension: Secondary | ICD-10-CM

## 2021-11-10 DIAGNOSIS — N6341 Unspecified lump in right breast, subareolar: Secondary | ICD-10-CM

## 2021-11-10 MED ORDER — OLMESARTAN MEDOXOMIL 40 MG PO TABS
40.0000 mg | ORAL_TABLET | Freq: Every day | ORAL | 0 refills | Status: DC
  Filled 2021-11-10: qty 90, 90d supply, fill #0

## 2021-11-25 ENCOUNTER — Ambulatory Visit: Payer: Commercial Managed Care - PPO | Admitting: Internal Medicine

## 2021-11-25 ENCOUNTER — Other Ambulatory Visit (HOSPITAL_COMMUNITY): Payer: Self-pay

## 2021-11-25 ENCOUNTER — Encounter: Payer: Self-pay | Admitting: Internal Medicine

## 2021-11-25 VITALS — BP 128/86 | HR 89 | Temp 97.9°F | Ht 61.0 in | Wt 177.0 lb

## 2021-11-25 DIAGNOSIS — I10 Essential (primary) hypertension: Secondary | ICD-10-CM

## 2021-11-25 DIAGNOSIS — N1832 Chronic kidney disease, stage 3b: Secondary | ICD-10-CM | POA: Diagnosis not present

## 2021-11-25 DIAGNOSIS — R7303 Prediabetes: Secondary | ICD-10-CM | POA: Diagnosis not present

## 2021-11-25 DIAGNOSIS — F410 Panic disorder [episodic paroxysmal anxiety] without agoraphobia: Secondary | ICD-10-CM | POA: Insufficient documentation

## 2021-11-25 LAB — BASIC METABOLIC PANEL
BUN: 22 mg/dL (ref 6–23)
CO2: 29 mEq/L (ref 19–32)
Calcium: 9.1 mg/dL (ref 8.4–10.5)
Chloride: 105 mEq/L (ref 96–112)
Creatinine, Ser: 0.91 mg/dL (ref 0.40–1.20)
GFR: 68.72 mL/min (ref >60.00)
Glucose, Bld: 108 mg/dL — ABNORMAL HIGH (ref 70–99)
Potassium: 4 mEq/L (ref 3.5–5.1)
Sodium: 141 mEq/L (ref 135–145)

## 2021-11-25 LAB — HEMOGLOBIN A1C: Hgb A1c MFr Bld: 6.2 % (ref 4.6–6.5)

## 2021-11-25 MED ORDER — CLONAZEPAM 0.5 MG PO TABS
0.5000 mg | ORAL_TABLET | Freq: Two times a day (BID) | ORAL | 1 refills | Status: DC | PRN
  Filled 2021-11-25: qty 60, 30d supply, fill #0

## 2021-11-25 NOTE — Progress Notes (Signed)
Subjective:  Patient ID: Paula Parker, female    DOB: 1960/10/12  Age: 61 y.o. MRN: [id number]  CC: Hypertension   HPI Paula Parker presents for f/up -  She complains of a 104-monthhistory of feeling anxious and panicky.  She is concerned about her husband's alcohol intake.  She has had some episodes of feeling hopeless.  She does not feel helpless.  She sleeps well and has a good appetite.  She denies SI or HI.  She is active and denies chest pain or shortness of breath.  Outpatient Medications Prior to Visit  Medication Sig Dispense Refill   Cholecalciferol 50 MCG (2000 UT) TABS Take 1 tablet (2,000 Units total) by mouth daily. 90 tablet 1   clobetasol ointment (TEMOVATE) 09.62% Apply 1 application topically 2 (two) times daily  for 1 week, then daily for 1 week, then every other day for 1 week, then twice weekly 60 g 2   esomeprazole (NEXIUM) 40 MG capsule Take 1 capsule (40 mg total) by mouth daily at 12 noon. 90 capsule 1   olmesartan (BENICAR) 40 MG tablet Take 1 tablet (40 mg total) by mouth daily. 90 tablet 0   No facility-administered medications prior to visit.    ROS Review of Systems  Constitutional: Negative.  Negative for diaphoresis and fatigue.  HENT: Negative.    Eyes: Negative.   Respiratory:  Negative for chest tightness, shortness of breath and wheezing.   Cardiovascular:  Negative for chest pain, palpitations and leg swelling.  Gastrointestinal: Negative.  Negative for abdominal pain, diarrhea and nausea.  Endocrine: Negative.   Genitourinary: Negative.  Negative for difficulty urinating.  Musculoskeletal: Negative.   Skin: Negative.   Neurological:  Negative for dizziness, weakness and light-headedness.  Hematological:  Negative for adenopathy. Does not bruise/bleed easily.  Psychiatric/Behavioral:  Negative for behavioral problems, confusion, decreased concentration, dysphoric mood, sleep disturbance and suicidal ideas. The patient is  nervous/anxious.     Objective:  BP 128/86 (BP Location: Right Arm, Patient Position: Sitting, Cuff Size: Large)   Pulse 89   Temp 97.9 F (36.6 C) (Oral)   Ht 5' 1"  (1.549 m)   Wt 177 lb (80.3 kg)   SpO2 94%   BMI 33.44 kg/m   BP Readings from Last 3 Encounters:  11/25/21 128/86  11/04/21 (!) 142/84  05/27/21 (!) 144/92    Wt Readings from Last 3 Encounters:  11/25/21 177 lb (80.3 kg)  11/04/21 177 lb (80.3 kg)  05/27/21 178 lb (80.7 kg)    Physical Exam Vitals reviewed.  HENT:     Nose: Nose normal.     Mouth/Throat:     Mouth: Mucous membranes are moist.  Eyes:     General: No scleral icterus.    Conjunctiva/sclera: Conjunctivae normal.  Cardiovascular:     Rate and Rhythm: Normal rate and regular rhythm.     Heart sounds: No murmur heard. Pulmonary:     Effort: Pulmonary effort is normal.     Breath sounds: No stridor. No wheezing, rhonchi or rales.  Abdominal:     General: Abdomen is flat.     Palpations: There is no mass.     Tenderness: There is no abdominal tenderness. There is no guarding.     Hernia: No hernia is present.  Musculoskeletal:        General: Normal range of motion.     Cervical back: Neck supple.     Right lower leg: No edema.  Left lower leg: No edema.  Lymphadenopathy:     Cervical: No cervical adenopathy.  Skin:    General: Skin is warm and dry.  Neurological:     General: No focal deficit present.     Mental Status: She is alert.  Psychiatric:        Mood and Affect: Mood normal.        Behavior: Behavior normal.        Thought Content: Thought content normal.        Judgment: Judgment normal.     Lab Results  Component Value Date   WBC 4.2 05/27/2021   HGB 14.2 05/27/2021   HCT 43.8 05/27/2021   PLT 257.0 05/27/2021   GLUCOSE 108 (H) 11/25/2021   CHOL 184 05/27/2021   TRIG 64.0 05/27/2021   HDL 59.90 05/27/2021   LDLCALC 112 (H) 05/27/2021   ALT 24 05/27/2021   AST 24 05/27/2021   NA 141 11/25/2021   K 4.0  11/25/2021   CL 105 11/25/2021   CREATININE 0.91 11/25/2021   BUN 22 11/25/2021   CO2 29 11/25/2021   TSH 2.78 05/27/2021   INR 1.0 05/27/2021   HGBA1C 6.2 11/25/2021    MM DIAG BREAST TOMO BILATERAL  Result Date: 11/10/2021 CLINICAL DATA:  61 year old female presenting with a new lump in the retroareolar right breast. EXAM: DIGITAL DIAGNOSTIC BILATERAL MAMMOGRAM WITH TOMOSYNTHESIS; ULTRASOUND RIGHT BREAST LIMITED TECHNIQUE: Bilateral digital diagnostic mammography and breast tomosynthesis was performed.; Targeted ultrasound examination of the right breast was performed COMPARISON:  Previous exam(s). ACR Breast Density Category a: The breast tissue is almost entirely fatty. FINDINGS: Mammogram: Right breast: A spot compression tomosynthesis view of the retroareolar right breast was performed in addition to standard views. There is no new abnormality in the retroareolar aspect or also in the right breast to suggest the presence of malignancy. Left breast: No suspicious mass, distortion, or microcalcifications are identified to suggest presence of malignancy. On physical exam of the retroareolar right breast I do not feel a fixed discrete mass or focal area of thickening. Ultrasound: Targeted ultrasound is performed throughout the retroareolar aspect of the right breast demonstrating no cystic or solid mass. There are a few minimally ectatic anechoic ducts which are benign. IMPRESSION: 1. No mammographic or sonographic evidence of malignancy or other imaging abnormality at the palpable site of concern in the right breast. 2.  No mammographic evidence of malignancy in the left breast. RECOMMENDATION: 1. Recommend any further workup of the palpable site in the right breast be on a clinical basis. 2.  Screening mammogram in one year.(Code:SM-B-01Y) I have discussed the findings and recommendations with the patient. If applicable, a reminder letter will be sent to the patient regarding the next appointment.  BI-RADS CATEGORY  1: Negative. Electronically Signed   By: Audie Pinto M.D.   On: 11/10/2021 11:47   US BREAST LTD UNI RIGHT INC AXILLA  Result Date: 11/10/2021 CLINICAL DATA:  61 year old female presenting with a new lump in the retroareolar right breast. EXAM: DIGITAL DIAGNOSTIC BILATERAL MAMMOGRAM WITH TOMOSYNTHESIS; ULTRASOUND RIGHT BREAST LIMITED TECHNIQUE: Bilateral digital diagnostic mammography and breast tomosynthesis was performed.; Targeted ultrasound examination of the right breast was performed COMPARISON:  Previous exam(s). ACR Breast Density Category a: The breast tissue is almost entirely fatty. FINDINGS: Mammogram: Right breast: A spot compression tomosynthesis view of the retroareolar right breast was performed in addition to standard views. There is no new abnormality in the retroareolar aspect or also in the right  breast to suggest the presence of malignancy. Left breast: No suspicious mass, distortion, or microcalcifications are identified to suggest presence of malignancy. On physical exam of the retroareolar right breast I do not feel a fixed discrete mass or focal area of thickening. Ultrasound: Targeted ultrasound is performed throughout the retroareolar aspect of the right breast demonstrating no cystic or solid mass. There are a few minimally ectatic anechoic ducts which are benign. IMPRESSION: 1. No mammographic or sonographic evidence of malignancy or other imaging abnormality at the palpable site of concern in the right breast. 2.  No mammographic evidence of malignancy in the left breast. RECOMMENDATION: 1. Recommend any further workup of the palpable site in the right breast be on a clinical basis. 2.  Screening mammogram in one year.(Code:SM-B-01Y) I have discussed the findings and recommendations with the patient. If applicable, a reminder letter will be sent to the patient regarding the next appointment. BI-RADS CATEGORY  1: Negative. Electronically Signed   By: Audie Pinto M.D.   On: 11/10/2021 11:47   Assessment & Plan:   Djeneba was seen today for hypertension.  Diagnoses and all orders for this visit:  Essential hypertension, benign- Her blood pressure is adequately well controlled. -     Basic metabolic panel; Future -     Basic metabolic panel  Chronic renal impairment, stage 3b ([place])- Her renal function is stable. -     Basic metabolic panel; Future -     Basic metabolic panel  Panic anxiety syndrome- She is willing to take a benzodiazepine but not an SSRI. -     clonazePAM (KLONOPIN) 0.5 MG tablet; Take 1 tablet (0.5 mg total) by mouth 2 (two) times daily as needed for anxiety.  Prediabetes -     Hemoglobin A1c; Future -     Hemoglobin A1c   I am having Connye Burkitt. Mun start on clonazePAM. I am also having her maintain her Cholecalciferol, esomeprazole, clobetasol ointment, and olmesartan.  Meds ordered this encounter  Medications   clonazePAM (KLONOPIN) 0.5 MG tablet    Sig: Take 1 tablet (0.5 mg total) by mouth 2 (two) times daily as needed for anxiety.    Dispense:  180 tablet    Refill:  1     Follow-up: Return in about 6 months (around 05/28/2022).  Scarlette Calico, MD

## 2021-11-25 NOTE — Patient Instructions (Signed)
Panic Attack ?A panic attack is a sudden episode of severe anxiety, fear, or discomfort that causes physical and emotional symptoms. A panic attack may be in response to something frightening, or it may occur for no known reason. ?Symptoms of a panic attack can be similar to symptoms of a heart attack or stroke. It is important to see your health care provider when you have a panic attack so that these conditions can be ruled out. ?What are the causes? ?A panic attack may be caused by: ?An extreme, life-threatening situation, such as a war or natural disaster. ?An anxiety disorder, such as post-traumatic stress disorder. ?Depression. ?Panic disorder. ?Certain medical conditions, including heart problems, neurological conditions, and infections. ?Other causes may include: ?Certain over-the-counter and prescription medicines. ?Supplements that increase anxiety. ?Illegal drugs that increase heart rate and blood pressure, such as methamphetamine. ?What increases the risk? ?You are more likely to develop this condition if: ?You have another mental health condition. ?You use alcohol, illegal drugs, or other substances. ?You are under extreme stress. ?A life event is causing increased feelings of anxiety and depression. ?What are the signs or symptoms? ?A panic attack starts suddenly, usually lasts 5-10 minutes, and occurs with one or more of the following: ?A pounding heart, or a feeling that your heart is beating irregularly or faster than normal (palpitations). ?Sweating, trembling, or shaking. ?Shortness of breath, feeling smothered, or feeling choked. ?Chest pain or discomfort. ?Nausea or a strange feeling in your stomach. ?Dizziness, feeling light-headed, or feeling like you might faint. ?Other symptoms may include: ?Chills or hot flashes. ?Numbness or tingling in your lips, hands, or feet. ?Feeling confused, or feeling that you are not yourself. ?Fear of losing control or of being emotionally unstable, or fear of  dying. ?How is this diagnosed? ?A panic attack is diagnosed with an assessment by your health care provider. During the assessment, your health care provider will ask questions about: ?Your history of anxiety, depression, and panic attacks. ?Your medical history. ?Whether you drink alcohol, use drugs, take supplements, or take medicines. Be honest about your substance use. ?Your health care provider may also: ?Order blood tests or other kinds of tests to rule out serious medical conditions. ?Refer you to a mental health professional for further evaluation. ?How is this treated? ?A panic attack is a symptom of another condition. Treatment depends on the cause of the panic attack. ?If the cause is a medical problem, your health care provider will treat that problem or refer you to a specialist. ?If the cause is emotional, you may be given anti-anxiety medicines or referred to a counselor. Anti-anxiety medicines may reduce how often attacks happen, reduce how severe the attacks are, and lower anxiety. ?If the cause is a medicine, your health care provider may tell you to stop the medicine, change your dose, or take a different medicine. ?If the cause is an illegal drug, treatment may involve letting the drug wear off and taking medicine to help the drug leave your body or to stop its effects. Attacks caused by heavy drug use may continue even if you stop using the drug. ?Most panic attacks go away with treatment of the underlying problem. If you have panic attacks often, you may have a condition called panic disorder. ?Follow these instructions at home: ?Alcohol use ?Do not drink alcohol if: ?Your health care provider tells you not to drink. ?You are pregnant, may be pregnant, or are planning to become pregnant. ?If you drink alcohol: ?Limit how much   you have to: ?0-1 drink a day for women. ?0-2 drinks a day for men. ?Know how much alcohol is in your drink. In the [place] one drink equals one 12 oz bottle of beer (355  mL), one 5 oz glass of wine (148 mL), or one 1? oz glass of hard liquor (44 mL). ?General instructions ?Take over-the-counter and prescription medicines only as told by your health care provider. ?If you feel anxious, limit your caffeine intake. ?Take good care of your physical and mental health by: ?Eating a balanced diet that includes plenty of fresh fruits and vegetables, whole grains, lean meats, and low-fat dairy. ?Getting plenty of rest. Try to get 7-8 hours of uninterrupted sleep each night. ?Exercising regularly. Try to get 30 minutes of physical activity at least 5 days a week. ?Do not use any products that contain nicotine or tobacco. These products include cigarettes, chewing tobacco, and vaping devices, such as e-cigarettes. If you need help quitting, ask your health care provider. ?Keep all follow-up visits. This is important. Panic attacks may have underlying physical or emotional problems that take time to accurately diagnose. ?Where to find more information ?Substance Abuse and Mental Health Services Administration (SAMHSA): samhsa.gov ?National Institute of Mental Health (NIMH): www.nimh.nih.gov ?Contact a health care provider if: ?Your symptoms do not improve, or they get worse. ?You are not able to take your medicine as prescribed because of side effects. ?Get help right away if: ?You have thoughts about hurting yourself or others. ?Get help right away if you feel like you may hurt yourself or others, or have thoughts about taking your own life. Go to your nearest emergency room or: ?Call 911. ?Call the National Suicide Prevention Lifeline at 1-800-273-8255 or 988. This is open 24 hours a day. ?Text the Crisis Text Line at 741741. ?Summary ?A panic attack is a sudden episode of severe anxiety, fear, or discomfort that causes physical and emotional symptoms. ?Always see a health care provider to have the reasons for the panic attack correctly diagnosed. ?If your panic attack was caused by a  physical problem, follow your health care provider's suggestions for medicine, referral to a specialist, and lifestyle changes. ?If your panic attack was caused by an emotional problem, follow through with counseling from a qualified mental health specialist. ?If you feel like you may hurt yourself or others, call 911 and get help right away. ?This information is not intended to replace advice given to you by your health care provider. Make sure you discuss any questions you have with your health care provider. ?Document Revised: 10/30/2020 Document Reviewed: 10/30/2020 ?Elsevier Patient Education ? 2023 [employer]vier Inc. ? ?

## 2021-12-03 ENCOUNTER — Other Ambulatory Visit (HOSPITAL_COMMUNITY): Payer: Self-pay

## 2022-01-11 ENCOUNTER — Other Ambulatory Visit (HOSPITAL_COMMUNITY): Payer: Self-pay

## 2022-02-17 ENCOUNTER — Other Ambulatory Visit (HOSPITAL_COMMUNITY): Payer: Self-pay

## 2022-02-17 ENCOUNTER — Other Ambulatory Visit: Payer: Self-pay | Admitting: Internal Medicine

## 2022-02-17 DIAGNOSIS — I10 Essential (primary) hypertension: Secondary | ICD-10-CM

## 2022-02-17 MED ORDER — OLMESARTAN MEDOXOMIL 40 MG PO TABS
40.0000 mg | ORAL_TABLET | Freq: Every day | ORAL | 0 refills | Status: DC
  Filled 2022-02-17: qty 90, 90d supply, fill #0

## 2022-03-16 ENCOUNTER — Ambulatory Visit: Payer: Commercial Managed Care - PPO | Admitting: Internal Medicine

## 2022-04-12 ENCOUNTER — Other Ambulatory Visit (HOSPITAL_COMMUNITY): Payer: Self-pay

## 2022-04-12 ENCOUNTER — Other Ambulatory Visit: Payer: Self-pay | Admitting: Internal Medicine

## 2022-04-12 ENCOUNTER — Other Ambulatory Visit: Payer: Self-pay

## 2022-04-12 DIAGNOSIS — K219 Gastro-esophageal reflux disease without esophagitis: Secondary | ICD-10-CM

## 2022-04-12 MED ORDER — ESOMEPRAZOLE MAGNESIUM 40 MG PO CPDR
40.0000 mg | DELAYED_RELEASE_CAPSULE | Freq: Every day | ORAL | 0 refills | Status: DC
  Filled 2022-04-12: qty 90, 90d supply, fill #0

## 2022-04-15 ENCOUNTER — Other Ambulatory Visit (HOSPITAL_COMMUNITY): Payer: Self-pay

## 2022-05-24 ENCOUNTER — Other Ambulatory Visit (HOSPITAL_COMMUNITY): Payer: Self-pay

## 2022-05-24 ENCOUNTER — Other Ambulatory Visit: Payer: Self-pay | Admitting: Internal Medicine

## 2022-05-24 DIAGNOSIS — I10 Essential (primary) hypertension: Secondary | ICD-10-CM

## 2022-05-24 MED ORDER — OLMESARTAN MEDOXOMIL 40 MG PO TABS
40.0000 mg | ORAL_TABLET | Freq: Every day | ORAL | 0 refills | Status: DC
  Filled 2022-05-24: qty 90, 90d supply, fill #0

## 2022-05-26 ENCOUNTER — Ambulatory Visit: Payer: Commercial Managed Care - PPO | Admitting: Internal Medicine

## 2022-06-02 ENCOUNTER — Encounter: Payer: Self-pay | Admitting: Internal Medicine

## 2022-06-02 ENCOUNTER — Ambulatory Visit (INDEPENDENT_AMBULATORY_CARE_PROVIDER_SITE_OTHER): Payer: Commercial Managed Care - PPO | Admitting: Internal Medicine

## 2022-06-02 VITALS — BP 142/92 | HR 96 | Temp 97.8°F | Resp 16 | Ht 61.0 in | Wt 176.0 lb

## 2022-06-02 DIAGNOSIS — I83813 Varicose veins of bilateral lower extremities with pain: Secondary | ICD-10-CM

## 2022-06-02 DIAGNOSIS — Z0001 Encounter for general adult medical examination with abnormal findings: Secondary | ICD-10-CM | POA: Insufficient documentation

## 2022-06-02 DIAGNOSIS — R7303 Prediabetes: Secondary | ICD-10-CM

## 2022-06-02 DIAGNOSIS — I10 Essential (primary) hypertension: Secondary | ICD-10-CM

## 2022-06-02 DIAGNOSIS — K7581 Nonalcoholic steatohepatitis (NASH): Secondary | ICD-10-CM

## 2022-06-02 DIAGNOSIS — Z0000 Encounter for general adult medical examination without abnormal findings: Secondary | ICD-10-CM | POA: Diagnosis not present

## 2022-06-02 DIAGNOSIS — N1832 Chronic kidney disease, stage 3b: Secondary | ICD-10-CM

## 2022-06-02 DIAGNOSIS — N182 Chronic kidney disease, stage 2 (mild): Secondary | ICD-10-CM

## 2022-06-02 DIAGNOSIS — I739 Peripheral vascular disease, unspecified: Secondary | ICD-10-CM

## 2022-06-02 LAB — URINALYSIS, ROUTINE W REFLEX MICROSCOPIC
Bilirubin Urine: NEGATIVE
Ketones, ur: NEGATIVE
Nitrite: NEGATIVE
Specific Gravity, Urine: 1.025 (ref 1.000–1.030)
Total Protein, Urine: NEGATIVE
Urine Glucose: NEGATIVE
Urobilinogen, UA: 0.2 (ref 0.0–1.0)
pH: 6 (ref 5.0–8.0)

## 2022-06-02 LAB — HEPATIC FUNCTION PANEL
ALT: 17 U/L (ref 0–35)
AST: 20 U/L (ref 0–37)
Albumin: 3.9 g/dL (ref 3.5–5.2)
Alkaline Phosphatase: 50 U/L (ref 39–117)
Bilirubin, Direct: 0.1 mg/dL (ref 0.0–0.3)
Total Bilirubin: 0.5 mg/dL (ref 0.2–1.2)
Total Protein: 6.3 g/dL (ref 6.0–8.3)

## 2022-06-02 LAB — BASIC METABOLIC PANEL
BUN: 20 mg/dL (ref 6–23)
CO2: 29 mEq/L (ref 19–32)
Calcium: 9.4 mg/dL (ref 8.4–10.5)
Chloride: 106 mEq/L (ref 96–112)
Creatinine, Ser: 0.84 mg/dL (ref 0.40–1.20)
GFR: 75.37 mL/min (ref >60.00)
Glucose, Bld: 101 mg/dL — ABNORMAL HIGH (ref 70–99)
Potassium: 3.9 mEq/L (ref 3.5–5.1)
Sodium: 142 mEq/L (ref 135–145)

## 2022-06-02 LAB — CBC WITH DIFFERENTIAL/PLATELET
Basophils Absolute: 0 10*3/uL (ref 0.0–0.1)
Basophils Relative: 0.7 % (ref 0.0–3.0)
Eosinophils Absolute: 0.1 10*3/uL (ref 0.0–0.7)
Eosinophils Relative: 1.9 % (ref 0.0–5.0)
HCT: 41.6 % (ref 36.0–46.0)
Hemoglobin: 13.9 g/dL (ref 12.0–15.0)
Lymphocytes Relative: 27.6 % (ref 12.0–46.0)
Lymphs Abs: 1.6 10*3/uL (ref 0.7–4.0)
MCHC: 33.4 g/dL (ref 30.0–36.0)
MCV: 95.8 fl (ref 78.0–100.0)
Monocytes Absolute: 0.5 10*3/uL (ref 0.1–1.0)
Monocytes Relative: 7.8 % (ref 3.0–12.0)
Neutro Abs: 3.6 10*3/uL (ref 1.4–7.7)
Neutrophils Relative %: 62 % (ref 43.0–77.0)
Platelets: 260 10*3/uL (ref 150.0–400.0)
RBC: 4.34 Mil/uL (ref 3.87–5.11)
RDW: 13.2 % (ref 11.5–15.5)
WBC: 5.9 10*3/uL (ref 4.0–10.5)

## 2022-06-02 LAB — HEMOGLOBIN A1C: Hgb A1c MFr Bld: 6 % (ref 4.6–6.5)

## 2022-06-02 NOTE — Progress Notes (Signed)
Subjective:  Patient ID: Paula Parker, female    DOB: [birth year]  Age: 62 y.o. MRN: [id number]  CC: Annual Exam, Gastroesophageal Reflux, and Hypertension   HPI Zeba Cerio presents for a CPX and f/up ----  She complains of bilateral lower extremity pain worse on the right than the left.  On the right side she has a dull achy sensation associated with the varicose veins.  She walks every day and denies chest pain, shortness of breath, diaphoresis or edema but does complain of bilateral calf claudication.  Outpatient Medications Prior to Visit  Medication Sig Dispense Refill   Cholecalciferol 50 MCG (2000 UT) TABS Take 1 tablet (2,000 Units total) by mouth daily. 90 tablet 1   clobetasol ointment (TEMOVATE) [id number] % Apply 1 application topically 2 (two) times daily  for 1 week, then daily for 1 week, then every other day for 1 week, then twice weekly 60 g 2   clonazePAM (KLONOPIN) 0.5 MG tablet Take 1 tablet (0.5 mg total) by mouth 2 (two) times daily as needed for anxiety. 180 tablet 1   esomeprazole (NEXIUM) 40 MG capsule Take 1 capsule (40 mg total) by mouth daily at 12 noon. 90 capsule 0   olmesartan (BENICAR) 40 MG tablet Take 1 tablet (40 mg total) by mouth daily. 90 tablet 0   No facility-administered medications prior to visit.    ROS Review of Systems  Constitutional: Negative.  Negative for chills, diaphoresis, fatigue and fever.  HENT: Negative.    Eyes: Negative.   Respiratory:  Negative for cough, chest tightness, shortness of breath and wheezing.   Cardiovascular:  Negative for chest pain, palpitations and leg swelling.  Gastrointestinal:  Negative for abdominal pain, constipation, diarrhea, nausea and vomiting.  Endocrine: Negative.   Genitourinary: Negative.  Negative for difficulty urinating.  Musculoskeletal: Negative.  Negative for arthralgias and myalgias.  Skin: Negative.   Neurological:  Negative for dizziness, weakness, light-headedness, numbness and  headaches.  Hematological:  Negative for adenopathy. Does not bruise/bleed easily.  Psychiatric/Behavioral:  Negative for sleep disturbance. The patient is nervous/anxious.     Objective:  BP (!) 142/92 (BP Location: Left Arm, Patient Position: Sitting, Cuff Size: Normal)   Pulse 96   Temp 97.8 F (36.6 C) (Oral)   Resp 16   Ht '5\' 1"'$  (1.549 m)   Wt 176 lb (79.8 kg)   SpO2 98%   BMI 33.25 kg/m   BP Readings from Last 3 Encounters:  06/02/22 (!) 142/92  11/25/21 128/86  11/04/21 (!) 142/84    Wt Readings from Last 3 Encounters:  06/02/22 176 lb (79.8 kg)  11/25/21 177 lb (80.3 kg)  11/04/21 177 lb (80.3 kg)    Physical Exam Vitals reviewed.  Constitutional:      Appearance: She is not ill-appearing.  HENT:     Nose: Nose normal.     Mouth/Throat:     Mouth: Mucous membranes are moist.     Pharynx: No oropharyngeal exudate.  Eyes:     General: No scleral icterus.    Conjunctiva/sclera: Conjunctivae normal.  Cardiovascular:     Rate and Rhythm: Normal rate and regular rhythm.     Pulses:          Dorsalis pedis pulses are 0 on the right side and 0 on the left side.       Posterior tibial pulses are 1+ on the right side and 1+ on the left side.     Heart sounds: Normal  heart sounds, S1 normal and S2 normal. No murmur heard.    No friction rub. No gallop.     Comments: EKG- NSR, 64 bpm No LVH or Q waves Normal EKG Pulmonary:     Effort: Pulmonary effort is normal.     Breath sounds: No stridor. No wheezing, rhonchi or rales.  Abdominal:     General: Abdomen is flat.     Palpations: There is no mass.     Tenderness: There is no abdominal tenderness. There is no guarding.     Hernia: No hernia is present.  Musculoskeletal:     Cervical back: Neck supple.     Right lower leg: No edema.     Left lower leg: No edema.  Feet:     Right foot:     Skin integrity: Skin integrity normal.     Left foot:     Skin integrity: Skin integrity normal.  Lymphadenopathy:      Cervical: No cervical adenopathy.  Skin:    General: Skin is warm and dry.     Findings: No rash.  Neurological:     General: No focal deficit present.     Mental Status: She is alert. Mental status is at baseline.  Psychiatric:        Mood and Affect: Mood normal.        Behavior: Behavior normal.     Lab Results  Component Value Date   WBC 5.9 06/02/2022   HGB 13.9 06/02/2022   HCT 41.6 06/02/2022   PLT 260.0 06/02/2022   GLUCOSE 101 (H) 06/02/2022   CHOL 184 05/27/2021   TRIG 64.0 05/27/2021   HDL 59.90 05/27/2021   LDLCALC 112 (H) 05/27/2021   ALT 17 06/02/2022   AST 20 06/02/2022   NA 142 06/02/2022   K 3.9 06/02/2022   CL 106 06/02/2022   CREATININE 0.84 06/02/2022   BUN 20 06/02/2022   CO2 29 06/02/2022   TSH 2.78 05/27/2021   INR 1.0 05/27/2021   HGBA1C 6.0 06/02/2022    MM DIAG BREAST TOMO BILATERAL  Result Date: 11/10/2021 CLINICAL DATA:  62 year old female presenting with a new lump in the retroareolar right breast. EXAM: DIGITAL DIAGNOSTIC BILATERAL MAMMOGRAM WITH TOMOSYNTHESIS; ULTRASOUND RIGHT BREAST LIMITED TECHNIQUE: Bilateral digital diagnostic mammography and breast tomosynthesis was performed.; Targeted ultrasound examination of the right breast was performed COMPARISON:  Previous exam(s). ACR Breast Density Category a: The breast tissue is almost entirely fatty. FINDINGS: Mammogram: Right breast: A spot compression tomosynthesis view of the retroareolar right breast was performed in addition to standard views. There is no new abnormality in the retroareolar aspect or also in the right breast to suggest the presence of malignancy. Left breast: No suspicious mass, distortion, or microcalcifications are identified to suggest presence of malignancy. On physical exam of the retroareolar right breast I do not feel a fixed discrete mass or focal area of thickening. Ultrasound: Targeted ultrasound is performed throughout the retroareolar aspect of the right breast  demonstrating no cystic or solid mass. There are a few minimally ectatic anechoic ducts which are benign. IMPRESSION: 1. No mammographic or sonographic evidence of malignancy or other imaging abnormality at the palpable site of concern in the right breast. 2.  No mammographic evidence of malignancy in the left breast. RECOMMENDATION: 1. Recommend any further workup of the palpable site in the right breast be on a clinical basis. 2.  Screening mammogram in one year.(Code:SM-B-01Y) I have discussed the findings and recommendations with the  patient. If applicable, a reminder letter will be sent to the patient regarding the next appointment. BI-RADS CATEGORY  1: Negative. Electronically Signed   By: Audie Pinto M.D.   On: 11/10/2021 11:47   US BREAST LTD UNI RIGHT INC AXILLA  Result Date: 11/10/2021 CLINICAL DATA:  62 year old female presenting with a new lump in the retroareolar right breast. EXAM: DIGITAL DIAGNOSTIC BILATERAL MAMMOGRAM WITH TOMOSYNTHESIS; ULTRASOUND RIGHT BREAST LIMITED TECHNIQUE: Bilateral digital diagnostic mammography and breast tomosynthesis was performed.; Targeted ultrasound examination of the right breast was performed COMPARISON:  Previous exam(s). ACR Breast Density Category a: The breast tissue is almost entirely fatty. FINDINGS: Mammogram: Right breast: A spot compression tomosynthesis view of the retroareolar right breast was performed in addition to standard views. There is no new abnormality in the retroareolar aspect or also in the right breast to suggest the presence of malignancy. Left breast: No suspicious mass, distortion, or microcalcifications are identified to suggest presence of malignancy. On physical exam of the retroareolar right breast I do not feel a fixed discrete mass or focal area of thickening. Ultrasound: Targeted ultrasound is performed throughout the retroareolar aspect of the right breast demonstrating no cystic or solid mass. There are a few minimally  ectatic anechoic ducts which are benign. IMPRESSION: 1. No mammographic or sonographic evidence of malignancy or other imaging abnormality at the palpable site of concern in the right breast. 2.  No mammographic evidence of malignancy in the left breast. RECOMMENDATION: 1. Recommend any further workup of the palpable site in the right breast be on a clinical basis. 2.  Screening mammogram in one year.(Code:SM-B-01Y) I have discussed the findings and recommendations with the patient. If applicable, a reminder letter will be sent to the patient regarding the next appointment. BI-RADS CATEGORY  1: Negative. Electronically Signed   By: Audie Pinto M.D.   On: 11/10/2021 11:47   Assessment & Plan:   Maali was seen today for annual exam, gastroesophageal reflux and hypertension.  Diagnoses and all orders for this visit:  Essential hypertension, benign-she has not achieved her blood pressure goal of 130/80.  Will continue the ARB and she will continue working on her lifestyle modifications. -     CBC with Differential/Platelet; Future -     Basic metabolic panel; Future -     Urinalysis, Routine w reflex microscopic; Future -     Hepatic function panel; Future -     EKG 12-Lead -     Hepatic function panel -     Urinalysis, Routine w reflex microscopic -     Basic metabolic panel -     CBC with Differential/Platelet  Nonalcoholic steatohepatitis (NASH) -     Hepatic function panel; Future -     Hepatic function panel  Chronic renal impairment, stage 3b (HCC)-her renal function has improved some. -     Basic metabolic panel; Future -     Urinalysis, Routine w reflex microscopic; Future -     Urinalysis, Routine w reflex microscopic -     Basic metabolic panel  Prediabetes-her A1c is 6.0%. -     Basic metabolic panel; Future -     Hemoglobin A1c; Future -     Hemoglobin A1c -     Basic metabolic panel  Varicose veins of both lower extremities with pain -     Ambulatory referral to  Vascular Surgery  Encounter for general adult medical examination with abnormal findings- Exam completed, labs reviewed, vaccines reviewed and updated,  cancer screenings have been addressed, patient education was given.  Claudication of both lower extremities (HCC) -     VAS [place] ABI WITH/WO TBI; Future   I am having Connye Burkitt. Montas maintain her Cholecalciferol, clobetasol ointment, clonazePAM, esomeprazole, and olmesartan.  No orders of the defined types were placed in this encounter.    Follow-up: Return in about 6 months (around 12/01/2022).  Scarlette Calico, MD

## 2022-06-02 NOTE — Patient Instructions (Signed)

## 2022-06-06 DIAGNOSIS — I739 Peripheral vascular disease, unspecified: Secondary | ICD-10-CM | POA: Insufficient documentation

## 2022-06-06 DIAGNOSIS — N182 Chronic kidney disease, stage 2 (mild): Secondary | ICD-10-CM | POA: Insufficient documentation

## 2022-06-10 ENCOUNTER — Ambulatory Visit (INDEPENDENT_AMBULATORY_CARE_PROVIDER_SITE_OTHER): Payer: Commercial Managed Care - PPO

## 2022-06-10 ENCOUNTER — Ambulatory Visit: Payer: Commercial Managed Care - PPO | Admitting: Internal Medicine

## 2022-06-10 ENCOUNTER — Encounter: Payer: Self-pay | Admitting: Internal Medicine

## 2022-06-10 VITALS — BP 130/88 | HR 82 | Temp 98.0°F | Ht 61.0 in | Wt 176.0 lb

## 2022-06-10 DIAGNOSIS — M8000XG Age-related osteoporosis with current pathological fracture, unspecified site, subsequent encounter for fracture with delayed healing: Secondary | ICD-10-CM

## 2022-06-10 DIAGNOSIS — M5441 Lumbago with sciatica, right side: Secondary | ICD-10-CM | POA: Insufficient documentation

## 2022-06-10 NOTE — Patient Instructions (Signed)
Managing Chronic Back Pain Chronic back pain is pain that lasts longer than 3 months. It often affects the lower back. It may feel like a muscle ache or a sharp, stabbing pain. It can be mild, moderate, or severe. There are things you can do to help manage your pain. See what works best for you. Your health care provider may also give you other instructions. What actions can I take to manage my chronic back pain? You may be given a treatment plan by your provider. Treatment often starts with rest and pain relief. It may also include: Physical therapy. These are exercises to help restore movement and strength to your back. Techniques to help you relax. Counseling or therapy. Cognitive behavioral therapy (CBT) is a form of therapy that helps you set goals and make changes. Acupuncture or massage therapy. Local electrical stimulation. Injections. You may be given medicines to numb an area or relieve pain. If other treatments do not help, you may need surgery. How to use body mechanics and posture to help with pain You can help relieve stress on your back with good posture and healthy body mechanics. Body mechanics are all the ways your body moves during the day. Posture is part of body mechanics. Good posture means: Your spine is in its correct S-curve, or neutral, position. Your shoulders are pulled back a bit. Your head is not tipped forward. To improve your posture and body mechanics, follow these guidelines. Standing  When standing, keep your feet about hip-width apart. Keep your knees slightly bent. Your ears, shoulders, and hips should line up. Your spine should be neutral. When you stand in one place for a long time, place one foot on a stable object that is 2-4 inches (5-10 cm) high, such as a footstool. Sitting  When sitting, keep your feet flat on the floor. Use a footrest, if needed. Keep your thighs parallel to the floor. Try not to round your shoulders or tilt your head  forward. When working at a desk or a computer: Position your desk so your hands are a little lower than your elbows. Slide your chair under your desk so you are close enough to have good posture. Position your monitor so you are looking straight ahead and do not have to tilt your head to view the screen. Lifting  Keep your feet shoulder-width apart. Tighten the muscles of your abdomen. Bend your knees and hips. Keep your spine neutral. Lift using the strength of your legs, not your back. Do not lock your knees straight out. Ask for help to lift heavy or awkward objects. Resting  Do not lie down in a way that causes pain. If you have pain when you sit, bend, stoop, or squat, lie in a way that your body does not bend much. Try not to curl up on your side with your arms and knees near your chest (fetal position). If it hurts to stand for a long time or reach with your arms, lie with your spine neutral and knees bent slightly. Try lying: On your side with a pillow between your knees. On your back with a pillow under your knees. How to recognize changes in your chronic back pain Let your provider know if your pain gets worse or does not get better with treatment. Your back pain may be getting worse if you have pain that: Starts to cause problems with your posture. Gets worse when you sit, stand, walk, bend, or lift things. Happens when you are active,  at rest, or both. Makes it hard for you to move around (limits mobility). Occurs with fever, weight loss, or trouble peeing (urinating). Causes numbness and tingling. Follow these instructions at home: Medicines You may need to take medicines for pain and inflammation. These may be taken by mouth or put on the skin. You may also be given muscle relaxants. Take over-the-counter and prescription medicines only as told by your provider. Ask your provider if the medicine prescribed to you: Requires you to avoid driving or using machinery. Can  cause constipation. You may need to take these actions to prevent or treat constipation: Drink enough fluid to keep your pee (urine) pale yellow. Take over-the-counter or prescription medicines. Eat foods that are high in fiber, such as beans, whole grains, and fresh fruits and vegetables. Limit foods that are high in fat and processed sugars, such as fried or sweet foods. Lifestyle Do not use any products that contain nicotine or tobacco. These products include cigarettes, chewing tobacco, and vaping devices, such as e-cigarettes. If you need help quitting, ask your provider. Eat a healthy diet. Eat lots of vegetables, fruits, fish, and lean meats. Work with your provider to stay at a healthy weight. General instructions Get regular exercise as told. Exercise can help with flexibility and strength. If physical therapy was prescribed, do exercises as told by your provider. Use ice or heat therapy as told by your provider. Where can I get support? Think about joining a support group for people with chronic back pain. You can find some groups at: Pain Connection Program: painconnection.org The American Chronic Pain Association: acpanow.com Contact a health care provider if: Your pain does not get better with rest or medicine. You have new pain. You have a fever. You lose weight quickly. You have trouble doing your normal activities. You feel weak or numb in one or both of your legs or feet. Get help right away if: You are not able to control when you pee or poop. You have severe back pain and: Nausea or vomiting. Pain in your chest or abdomen. Shortness of breath. You faint. These symptoms may be an emergency. Get help right away. Call 911. Do not wait to see if the symptoms will go away. Do not drive yourself to the hospital. This information is not intended to replace advice given to you by your health care provider. Make sure you discuss any questions you have with your health care  provider. Document Revised: 11/09/2021 Document Reviewed: 11/09/2021 Elsevier Patient Education  [place].

## 2022-06-10 NOTE — Progress Notes (Signed)
Subjective:  Patient ID: Paula Parker, female    DOB: 31-Aug-1960  Age: 62 y.o. MRN: [id number]  CC: Back Pain and Hypertension   HPI Kowsar Grun presents for f/up ----  She compalins of a 2 day history or right lower back pain that radiates into her RLE with RLE numbness.  Outpatient Medications Prior to Visit  Medication Sig Dispense Refill   Cholecalciferol 50 MCG (2000 UT) TABS Take 1 tablet (2,000 Units total) by mouth daily. 90 tablet 1   clobetasol ointment (TEMOVATE) [id number] % Apply 1 application topically 2 (two) times daily  for 1 week, then daily for 1 week, then every other day for 1 week, then twice weekly 60 g 2   esomeprazole (NEXIUM) 40 MG capsule Take 1 capsule (40 mg total) by mouth daily at 12 noon. 90 capsule 0   olmesartan (BENICAR) 40 MG tablet Take 1 tablet (40 mg total) by mouth daily. 90 tablet 0   clonazePAM (KLONOPIN) 0.5 MG tablet Take 1 tablet (0.5 mg total) by mouth 2 (two) times daily as needed for anxiety. (Patient not taking: Reported on 06/10/2022) 180 tablet 1   No facility-administered medications prior to visit.    ROS Review of Systems  Constitutional: Negative.  Negative for chills, diaphoresis, fatigue and fever.  HENT: Negative.    Eyes: Negative.   Respiratory:  Negative for cough, chest tightness, shortness of breath and wheezing.   Cardiovascular:  Negative for chest pain and leg swelling.  Gastrointestinal:  Negative for abdominal pain, diarrhea, nausea and vomiting.  Endocrine: Negative.   Genitourinary: Negative.  Negative for difficulty urinating.  Musculoskeletal:  Positive for back pain. Negative for arthralgias and myalgias.  Skin: Negative.   Neurological:  Positive for numbness. Negative for dizziness and weakness.  Hematological:  Negative for adenopathy. Does not bruise/bleed easily.  Psychiatric/Behavioral: Negative.      Objective:  BP 130/88 (BP Location: Left Arm, Patient Position: Sitting, Cuff Size: Normal)    Pulse 82   Temp 98 F (36.7 C) (Oral)   Ht '5\' 1"'$  (1.549 m)   Wt 176 lb (79.8 kg)   SpO2 96%   BMI 33.25 kg/m   BP Readings from Last 3 Encounters:  06/10/22 130/88  06/02/22 (!) 142/92  11/25/21 128/86    Wt Readings from Last 3 Encounters:  06/10/22 176 lb (79.8 kg)  06/02/22 176 lb (79.8 kg)  11/25/21 177 lb (80.3 kg)    Physical Exam Vitals reviewed.  Constitutional:      Appearance: Normal appearance.  HENT:     Mouth/Throat:     Mouth: Mucous membranes are moist.  Eyes:     General: No scleral icterus.    Conjunctiva/sclera: Conjunctivae normal.  Cardiovascular:     Rate and Rhythm: Normal rate and regular rhythm.     Heart sounds: No murmur heard. Pulmonary:     Effort: Pulmonary effort is normal.     Breath sounds: No stridor. No wheezing, rhonchi or rales.  Abdominal:     General: Abdomen is flat.     Palpations: There is no mass.     Tenderness: There is no abdominal tenderness. There is no guarding.     Hernia: No hernia is present.  Musculoskeletal:        General: Normal range of motion.     Cervical back: Neck supple.     Thoracic back: Normal.     Lumbar back: Normal. No bony tenderness. Normal range of motion.  Negative right straight leg raise test and negative left straight leg raise test.  Lymphadenopathy:     Cervical: No cervical adenopathy.  Skin:    General: Skin is warm and dry.  Neurological:     General: No focal deficit present.     Mental Status: She is alert.     Cranial Nerves: Cranial nerves 2-12 are intact.     Sensory: Sensation is intact.     Motor: Motor function is intact.     Coordination: Coordination is intact. Rapid alternating movements normal.     Gait: Gait is intact. Gait and tandem walk normal.     Deep Tendon Reflexes: Reflexes normal.     Reflex Scores:      Tricep reflexes are 1+ on the right side and 1+ on the left side.      Bicep reflexes are 1+ on the right side and 1+ on the left side.       Brachioradialis reflexes are 1+ on the right side and 1+ on the left side.      Patellar reflexes are 2+ on the right side and 2+ on the left side.      Achilles reflexes are 1+ on the right side and 1+ on the left side.    Lab Results  Component Value Date   WBC 5.9 06/02/2022   HGB 13.9 06/02/2022   HCT 41.6 06/02/2022   PLT 260.0 06/02/2022   GLUCOSE 101 (H) 06/02/2022   CHOL 184 05/27/2021   TRIG 64.0 05/27/2021   HDL 59.90 05/27/2021   LDLCALC 112 (H) 05/27/2021   ALT 17 06/02/2022   AST 20 06/02/2022   NA 142 06/02/2022   K 3.9 06/02/2022   CL 106 06/02/2022   CREATININE 0.84 06/02/2022   BUN 20 06/02/2022   CO2 29 06/02/2022   TSH 2.78 05/27/2021   INR 1.0 05/27/2021   HGBA1C 6.0 06/02/2022    MM DIAG BREAST TOMO BILATERAL  Result Date: 11/10/2021 CLINICAL DATA:  62 year old female presenting with a new lump in the retroareolar right breast. EXAM: DIGITAL DIAGNOSTIC BILATERAL MAMMOGRAM WITH TOMOSYNTHESIS; ULTRASOUND RIGHT BREAST LIMITED TECHNIQUE: Bilateral digital diagnostic mammography and breast tomosynthesis was performed.; Targeted ultrasound examination of the right breast was performed COMPARISON:  Previous exam(s). ACR Breast Density Category a: The breast tissue is almost entirely fatty. FINDINGS: Mammogram: Right breast: A spot compression tomosynthesis view of the retroareolar right breast was performed in addition to standard views. There is no new abnormality in the retroareolar aspect or also in the right breast to suggest the presence of malignancy. Left breast: No suspicious mass, distortion, or microcalcifications are identified to suggest presence of malignancy. On physical exam of the retroareolar right breast I do not feel a fixed discrete mass or focal area of thickening. Ultrasound: Targeted ultrasound is performed throughout the retroareolar aspect of the right breast demonstrating no cystic or solid mass. There are a few minimally ectatic anechoic ducts  which are benign. IMPRESSION: 1. No mammographic or sonographic evidence of malignancy or other imaging abnormality at the palpable site of concern in the right breast. 2.  No mammographic evidence of malignancy in the left breast. RECOMMENDATION: 1. Recommend any further workup of the palpable site in the right breast be on a clinical basis. 2.  Screening mammogram in one year.(Code:SM-B-01Y) I have discussed the findings and recommendations with the patient. If applicable, a reminder letter will be sent to the patient regarding the next appointment. BI-RADS CATEGORY  1:  Negative. Electronically Signed   By: Audie Pinto M.D.   On: 11/10/2021 11:47   US BREAST LTD UNI RIGHT INC AXILLA  Result Date: 11/10/2021 CLINICAL DATA:  62 year old female presenting with a new lump in the retroareolar right breast. EXAM: DIGITAL DIAGNOSTIC BILATERAL MAMMOGRAM WITH TOMOSYNTHESIS; ULTRASOUND RIGHT BREAST LIMITED TECHNIQUE: Bilateral digital diagnostic mammography and breast tomosynthesis was performed.; Targeted ultrasound examination of the right breast was performed COMPARISON:  Previous exam(s). ACR Breast Density Category a: The breast tissue is almost entirely fatty. FINDINGS: Mammogram: Right breast: A spot compression tomosynthesis view of the retroareolar right breast was performed in addition to standard views. There is no new abnormality in the retroareolar aspect or also in the right breast to suggest the presence of malignancy. Left breast: No suspicious mass, distortion, or microcalcifications are identified to suggest presence of malignancy. On physical exam of the retroareolar right breast I do not feel a fixed discrete mass or focal area of thickening. Ultrasound: Targeted ultrasound is performed throughout the retroareolar aspect of the right breast demonstrating no cystic or solid mass. There are a few minimally ectatic anechoic ducts which are benign. IMPRESSION: 1. No mammographic or sonographic  evidence of malignancy or other imaging abnormality at the palpable site of concern in the right breast. 2.  No mammographic evidence of malignancy in the left breast. RECOMMENDATION: 1. Recommend any further workup of the palpable site in the right breast be on a clinical basis. 2.  Screening mammogram in one year.(Code:SM-B-01Y) I have discussed the findings and recommendations with the patient. If applicable, a reminder letter will be sent to the patient regarding the next appointment. BI-RADS CATEGORY  1: Negative. Electronically Signed   By: Audie Pinto M.D.   On: 11/10/2021 11:47  DG Lumbar Spine Complete  Result Date: 06/10/2022 CLINICAL DATA:  Lower back pain EXAM: LUMBAR SPINE - COMPLETE 4+ VIEW COMPARISON:  CT 10/14/2017, radiograph 11/30/2014 FINDINGS: There are 5 non-rib-bearing lumbar vertebrae. Mild superior endplate irregularity of L3 which is similar in comparison to prior CT and radiograph. No definite lumbar spine fracture. There is mild multilevel degenerative disc disease, worst at L2-L3 and L4-L5. There is mild multilevel facet arthropathy, worst in the lower lumbar spine. IMPRESSION: Mild multilevel degenerative disc disease, worst at L2-L3 and L4-L5. Mild multilevel facet arthropathy, worst in the lower lumbar spine. Electronically Signed   By: Maurine Simmering M.D.   On: 06/10/2022 14:08      Assessment & Plan:   Meya was seen today for back pain and hypertension.  Diagnoses and all orders for this visit:  Acute right-sided low back pain with right-sided sciatica ----  She is neurologically intact. Plain films reveal DDD. She does not want to take a medication to treat this.  -     DG Lumbar Spine Complete; Future  Age-related osteoporosis with current pathological fracture with delayed healing, subsequent encounter -     DG Lumbar Spine Complete; Future   I am having Connye Burkitt. Stuckman maintain her Cholecalciferol, clobetasol ointment, clonazePAM, esomeprazole, and  olmesartan.  No orders of the defined types were placed in this encounter.    Follow-up: Return in about 3 months (around 09/10/2022).  Scarlette Calico, MD

## 2022-06-30 ENCOUNTER — Ambulatory Visit (HOSPITAL_COMMUNITY): Payer: Commercial Managed Care - PPO

## 2022-07-13 ENCOUNTER — Other Ambulatory Visit: Payer: Self-pay | Admitting: Internal Medicine

## 2022-07-13 ENCOUNTER — Other Ambulatory Visit (HOSPITAL_COMMUNITY): Payer: Self-pay

## 2022-07-13 DIAGNOSIS — K219 Gastro-esophageal reflux disease without esophagitis: Secondary | ICD-10-CM

## 2022-07-13 MED ORDER — ESOMEPRAZOLE MAGNESIUM 40 MG PO CPDR
40.0000 mg | DELAYED_RELEASE_CAPSULE | Freq: Every day | ORAL | 0 refills | Status: DC
  Filled 2022-07-13: qty 90, 90d supply, fill #0

## 2022-07-21 ENCOUNTER — Ambulatory Visit (HOSPITAL_COMMUNITY)
Admission: RE | Admit: 2022-07-21 | Discharge: 2022-07-21 | Disposition: A | Discharge status: Home / Self Care | Payer: Commercial Managed Care - PPO | Source: Ambulatory Visit | Attending: Internal Medicine | Admitting: Internal Medicine

## 2022-07-21 DIAGNOSIS — I739 Peripheral vascular disease, unspecified: Secondary | ICD-10-CM

## 2022-07-21 LAB — VAS US ABI WITH/WO TBI
Left ABI: 1.14
Right ABI: 1.14

## 2022-08-16 ENCOUNTER — Other Ambulatory Visit: Payer: Self-pay | Admitting: Internal Medicine

## 2022-08-16 ENCOUNTER — Other Ambulatory Visit (HOSPITAL_COMMUNITY): Payer: Self-pay

## 2022-08-16 DIAGNOSIS — I10 Essential (primary) hypertension: Secondary | ICD-10-CM

## 2022-08-16 MED ORDER — OLMESARTAN MEDOXOMIL 40 MG PO TABS
40.0000 mg | ORAL_TABLET | Freq: Every day | ORAL | 0 refills | Status: DC
  Filled 2022-08-16: qty 90, 90d supply, fill #0

## 2022-08-30 ENCOUNTER — Encounter: Payer: Self-pay | Admitting: Internal Medicine

## 2022-08-31 ENCOUNTER — Other Ambulatory Visit (HOSPITAL_COMMUNITY): Payer: Self-pay

## 2022-08-31 ENCOUNTER — Ambulatory Visit: Payer: Commercial Managed Care - PPO | Admitting: Internal Medicine

## 2022-08-31 ENCOUNTER — Encounter: Payer: Self-pay | Admitting: Internal Medicine

## 2022-08-31 VITALS — BP 132/88 | HR 70 | Temp 98.4°F | Ht 61.0 in | Wt 177.0 lb

## 2022-08-31 DIAGNOSIS — R062 Wheezing: Secondary | ICD-10-CM

## 2022-08-31 DIAGNOSIS — J22 Unspecified acute lower respiratory infection: Secondary | ICD-10-CM | POA: Diagnosis not present

## 2022-08-31 MED ORDER — HYDROCODONE BIT-HOMATROP MBR 5-1.5 MG/5ML PO SOLN
5.0000 mL | Freq: Three times a day (TID) | ORAL | 0 refills | Status: DC | PRN
  Filled 2022-08-31: qty 120, 8d supply, fill #0

## 2022-08-31 MED ORDER — CEFDINIR 300 MG PO CAPS
300.0000 mg | ORAL_CAPSULE | Freq: Two times a day (BID) | ORAL | 0 refills | Status: DC
  Filled 2022-08-31: qty 14, 7d supply, fill #0

## 2022-08-31 NOTE — Patient Instructions (Addendum)
      Medications changes include :  antibiotic and cough syrup      Return if symptoms worsen or fail to improve.  

## 2022-08-31 NOTE — Progress Notes (Signed)
Subjective:    Patient ID: Paula Parker, female    DOB: 06/12/1960, 62 y.o.   MRN: [id number]      HPI Paula Parker is here for  Chief Complaint  Patient presents with   Cough    Cough x 1 week, heard her chest making weird sounds last night; Green mucus, cough keeping her up at night and not resting    She is here for an acute visit for cold symptoms.    Her symptoms started several days ago  She is experiencing chills, postnasal drip, some runny nose, hoarseness, productive cough of discolored mucus, mild shortness of breath, wheezing, headaches and some lightheadedness.  She did see a little blood in the mucus at 1 point that she coughed up.  She has had a lot of drainage down the back of her throat, but denies any significant sinus pain or sinus pressure.   She has tried taking allergy medication, cough drops   Covid negative at home x 2   Medications and allergies reviewed with patient and updated if appropriate.  Current Outpatient Medications on File Prior to Visit  Medication Sig Dispense Refill   Cholecalciferol 50 MCG (2000 UT) TABS Take 1 tablet (2,000 Units total) by mouth daily. 90 tablet 1   clobetasol ointment (TEMOVATE) 0.05 % Apply 1 application topically 2 (two) times daily  for 1 week, then daily for 1 week, then every other day for 1 week, then twice weekly 60 g 2   clonazePAM (KLONOPIN) 0.5 MG tablet Take 1 tablet (0.5 mg total) by mouth 2 (two) times daily as needed for anxiety. 180 tablet 1   esomeprazole (NEXIUM) 40 MG capsule Take 1 capsule (40 mg total) by mouth daily at 12 noon. 90 capsule 0   olmesartan (BENICAR) 40 MG tablet Take 1 tablet (40 mg total) by mouth daily. 90 tablet 0   No current facility-administered medications on file prior to visit.    Review of Systems  Constitutional:  Positive for chills. Negative for fever.  HENT:  Positive for postnasal drip, rhinorrhea and voice change. Negative for congestion, ear pain, sinus pressure,  sinus pain and sore throat.   Respiratory:  Positive for cough (productive of green sputum), shortness of breath (mild) and wheezing.   Neurological:  Positive for light-headedness and headaches. Negative for dizziness.       Objective:   Vitals:   08/31/22 1548  BP: 132/88  Pulse: 70  Temp: 98.4 F (36.9 C)  SpO2: 97%   BP Readings from Last 3 Encounters:  08/31/22 132/88  06/10/22 130/88  06/02/22 (!) 142/92   Wt Readings from Last 3 Encounters:  08/31/22 177 lb (80.3 kg)  06/10/22 176 lb (79.8 kg)  06/02/22 176 lb (79.8 kg)   Body mass index is 33.44 kg/m.    Physical Exam Constitutional:      General: She is not in acute distress.    Appearance: Normal appearance. She is not ill-appearing.     Comments: Mild hoarseness  HENT:     Head: Normocephalic and atraumatic.     Right Ear: Tympanic membrane, ear canal and external ear normal.     Left Ear: Tympanic membrane, ear canal and external ear normal.     Mouth/Throat:     Mouth: Mucous membranes are moist.     Pharynx: No oropharyngeal exudate or posterior oropharyngeal erythema.  Eyes:     Conjunctiva/sclera: Conjunctivae normal.  Cardiovascular:     Rate and  Rhythm: Normal rate and regular rhythm.  Pulmonary:     Effort: Pulmonary effort is normal. No respiratory distress.     Breath sounds: Normal breath sounds. No wheezing or rales.  Musculoskeletal:     Cervical back: Neck supple. No tenderness.  Lymphadenopathy:     Cervical: No cervical adenopathy.  Skin:    General: Skin is warm and dry.  Neurological:     Mental Status: She is alert.            Assessment & Plan:    Lower respiratory tract infection, wheezing: Acute Symptoms consistent with respiratory infection with bronchospasm/wheezing Symptoms mild-moderate in nature and concern for bacterial cause No active wheezing on exam Start Omnicef twice daily x 7 days Hycodan cough syrup Continue over-the-counter cold medications Call  or return if no improvement

## 2022-09-01 ENCOUNTER — Other Ambulatory Visit (HOSPITAL_COMMUNITY): Payer: Self-pay

## 2022-09-01 ENCOUNTER — Telehealth: Payer: Self-pay | Admitting: Internal Medicine

## 2022-09-01 MED ORDER — AZITHROMYCIN 250 MG PO TABS
ORAL_TABLET | ORAL | 0 refills | Status: AC
  Filled 2022-09-01: qty 6, 5d supply, fill #0

## 2022-09-01 NOTE — Telephone Encounter (Signed)
Message left for patient today. 

## 2022-09-01 NOTE — Telephone Encounter (Signed)
Patient states she is uncomfortable taking the cefdinir (OMNICEF) 300 MG capsule that was prescribed for her. She asked if she could take the Z-PAK instead as she is more familiar with it.

## 2022-09-01 NOTE — Telephone Encounter (Signed)
Z-Pak sent to pharmacy

## 2022-09-02 ENCOUNTER — Telehealth: Payer: Self-pay | Admitting: Internal Medicine

## 2022-09-02 ENCOUNTER — Encounter: Payer: Self-pay | Admitting: Internal Medicine

## 2022-09-02 NOTE — Telephone Encounter (Signed)
Patient has a note from Dr. Lawerance Bach putting her out of work until Friday of this week.  Patient would like a note putting her out until Monday, 09/06/2022.  Please call patient and let her know.  Phone:  [phone]

## 2022-09-02 NOTE — Telephone Encounter (Signed)
Letter printed for Dr. Lawerance Bach to sign.

## 2022-09-03 NOTE — Telephone Encounter (Signed)
Letter taken up front for pick up

## 2022-09-03 NOTE — Telephone Encounter (Signed)
Pt has stated she will be here at 1pm to pick up note for work.

## 2022-09-03 NOTE — Telephone Encounter (Signed)
Patient has picked up letter.

## 2022-09-15 ENCOUNTER — Ambulatory Visit: Payer: Commercial Managed Care - PPO | Admitting: Internal Medicine

## 2022-09-27 ENCOUNTER — Ambulatory Visit: Payer: Medicare HMO | Admitting: Family Medicine

## 2022-09-27 ENCOUNTER — Ambulatory Visit (INDEPENDENT_AMBULATORY_CARE_PROVIDER_SITE_OTHER): Payer: Commercial Managed Care - PPO

## 2022-09-27 ENCOUNTER — Encounter: Payer: Self-pay | Admitting: Family Medicine

## 2022-09-27 VITALS — BP 120/78 | HR 80 | Temp 98.1°F | Resp 20 | Ht 61.0 in | Wt 179.0 lb

## 2022-09-27 DIAGNOSIS — M79645 Pain in left finger(s): Secondary | ICD-10-CM

## 2022-09-27 NOTE — Progress Notes (Signed)
Assessment & Plan:  1. Finger pain, left Encouraged to take ibuprofen instead of Tylenol due to its anti-inflammatory properties.  Continue to use ice. - DG Finger Little Left; Future   Follow up plan: Return if symptoms worsen or fail to improve.  Deliah Boston, MSN, APRN, FNP-C  Subjective:  HPI: Paula Parker is a 62 y.o. female presenting on 09/27/2022 for Hand Pain (Left - fifth finger pain x 1.5 weeks /Hit in the washing machine. )  Patient reports left pinky finger pain for the past 1.5 weeks since hitting it in the washing machine.  She has been taking Tylenol for pain and applying ice.   ROS: Negative unless specifically indicated above in HPI.   Relevant past medical history reviewed and updated as indicated.   Allergies and medications reviewed and updated.   Current Outpatient Medications:    Cholecalciferol 50 MCG (2000 UT) TABS, Take 1 tablet (2,000 Units total) by mouth daily., Disp: 90 tablet, Rfl: 1   clobetasol ointment (TEMOVATE) 0.05 %, Apply 1 application topically 2 (two) times daily  for 1 week, then daily for 1 week, then every other day for 1 week, then twice weekly, Disp: 60 g, Rfl: 2   esomeprazole (NEXIUM) 40 MG capsule, Take 1 capsule (40 mg total) by mouth daily at 12 noon., Disp: 90 capsule, Rfl: 0   olmesartan (BENICAR) 40 MG tablet, Take 1 tablet (40 mg total) by mouth daily., Disp: 90 tablet, Rfl: 0   HYDROcodone bit-homatropine (HYCODAN) 5-1.5 MG/5ML syrup, Take 5 mLs by mouth every 8 (eight) hours as needed for cough. (Patient not taking: Reported on 09/27/2022), Disp: 120 mL, Rfl: 0  Allergies  Allergen Reactions   Doxycycline Other (See Comments)    Throat swelling   Fish Allergy     Pt ate flounder and calves swelled   Benzonatate Rash and Other (See Comments)   Penicillins Rash    Has patient had a PCN reaction causing immediate rash, facial/tongue/throat swelling, SOB or lightheadedness with hypotension: Yes Has patient had a PCN  reaction causing severe rash involving mucus membranes or skin necrosis: No Has patient had a PCN reaction that required hospitalization: No Has patient had a PCN reaction occurring within the last 10 years: Yes If all of the above answers are "NO", then may proceed with Cephalosporin use.     Sulfonamide Derivatives Rash and Other (See Comments)    unknown    Objective:   BP 120/78   Pulse 80   Temp 98.1 F (36.7 C)   Resp 20   Ht 5\' 1"  (1.549 m)   Wt 179 lb (81.2 kg)   BMI 33.82 kg/m    Physical Exam Vitals reviewed.  Constitutional:      General: She is not in acute distress.    Appearance: Normal appearance. She is not ill-appearing, toxic-appearing or diaphoretic.  HENT:     Head: Normocephalic and atraumatic.  Eyes:     General: No scleral icterus.       Right eye: No discharge.        Left eye: No discharge.     Conjunctiva/sclera: Conjunctivae normal.  Cardiovascular:     Rate and Rhythm: Normal rate.  Pulmonary:     Effort: Pulmonary effort is normal. No respiratory distress.  Musculoskeletal:        General: Normal range of motion.     Cervical back: Normal range of motion.     Comments: Tenderness, mild swelling, and mild  erythema to the tip of the left little finger. No bruising.   Skin:    General: Skin is warm and dry.     Capillary Refill: Capillary refill takes less than 2 seconds.  Neurological:     General: No focal deficit present.     Mental Status: She is alert and oriented to person, place, and time. Mental status is at baseline.  Psychiatric:        Mood and Affect: Mood normal.        Behavior: Behavior normal.        Thought Content: Thought content normal.        Judgment: Judgment normal.

## 2022-09-28 ENCOUNTER — Encounter: Payer: Self-pay | Admitting: Family Medicine

## 2022-09-29 ENCOUNTER — Encounter: Payer: Self-pay | Admitting: Internal Medicine

## 2022-09-30 ENCOUNTER — Other Ambulatory Visit: Payer: Self-pay

## 2022-09-30 ENCOUNTER — Encounter: Payer: Self-pay | Admitting: Emergency Medicine

## 2022-09-30 DIAGNOSIS — N201 Calculus of ureter: Secondary | ICD-10-CM | POA: Diagnosis not present

## 2022-09-30 DIAGNOSIS — N1330 Unspecified hydronephrosis: Secondary | ICD-10-CM | POA: Diagnosis not present

## 2022-09-30 DIAGNOSIS — N182 Chronic kidney disease, stage 2 (mild): Secondary | ICD-10-CM | POA: Insufficient documentation

## 2022-09-30 DIAGNOSIS — I129 Hypertensive chronic kidney disease with stage 1 through stage 4 chronic kidney disease, or unspecified chronic kidney disease: Secondary | ICD-10-CM | POA: Diagnosis not present

## 2022-09-30 DIAGNOSIS — B951 Streptococcus, group B, as the cause of diseases classified elsewhere: Secondary | ICD-10-CM | POA: Diagnosis not present

## 2022-09-30 DIAGNOSIS — Z87891 Personal history of nicotine dependence: Secondary | ICD-10-CM | POA: Insufficient documentation

## 2022-09-30 DIAGNOSIS — N390 Urinary tract infection, site not specified: Secondary | ICD-10-CM | POA: Insufficient documentation

## 2022-09-30 DIAGNOSIS — Z79899 Other long term (current) drug therapy: Secondary | ICD-10-CM | POA: Insufficient documentation

## 2022-09-30 DIAGNOSIS — R109 Unspecified abdominal pain: Secondary | ICD-10-CM | POA: Diagnosis present

## 2022-09-30 DIAGNOSIS — R112 Nausea with vomiting, unspecified: Secondary | ICD-10-CM | POA: Diagnosis not present

## 2022-09-30 DIAGNOSIS — J45909 Unspecified asthma, uncomplicated: Secondary | ICD-10-CM | POA: Diagnosis not present

## 2022-09-30 DIAGNOSIS — N368 Other specified disorders of urethra: Secondary | ICD-10-CM | POA: Diagnosis not present

## 2022-09-30 LAB — URINALYSIS, ROUTINE W REFLEX MICROSCOPIC
Bilirubin Urine: NEGATIVE
Glucose, UA: NEGATIVE mg/dL
Ketones, ur: NEGATIVE mg/dL
Nitrite: NEGATIVE
Protein, ur: NEGATIVE mg/dL
Specific Gravity, Urine: 1.024 (ref 1.005–1.030)
WBC, UA: >50 WBC/hpf (ref 0–5)
pH: 5 (ref 5.0–8.0)

## 2022-09-30 LAB — CBC
HCT: 42.7 % (ref 36.0–46.0)
Hemoglobin: 13.5 g/dL (ref 12.0–15.0)
MCH: 31.3 pg (ref 26.0–34.0)
MCHC: 31.6 g/dL (ref 30.0–36.0)
MCV: 98.8 fL (ref 80.0–100.0)
Platelets: 263 10*3/uL (ref 150–400)
RBC: 4.32 MIL/uL (ref 3.87–5.11)
RDW: 13 % (ref 11.5–15.5)
WBC: 7.5 10*3/uL (ref 4.0–10.5)
nRBC: 0 % (ref 0.0–0.2)

## 2022-09-30 NOTE — ED Triage Notes (Signed)
Patient c/o right flank pain that started tonight.

## 2022-10-01 ENCOUNTER — Other Ambulatory Visit: Payer: Self-pay

## 2022-10-01 ENCOUNTER — Encounter: Payer: Self-pay | Admitting: Internal Medicine

## 2022-10-01 ENCOUNTER — Encounter: Payer: Self-pay | Admitting: Anesthesiology

## 2022-10-01 ENCOUNTER — Emergency Department: Payer: Commercial Managed Care - PPO

## 2022-10-01 ENCOUNTER — Observation Stay
Admission: EM | Admit: 2022-10-01 | Discharge: 2022-10-01 | Disposition: A | Discharge status: Home / Self Care | Payer: Commercial Managed Care - PPO | Attending: Internal Medicine | Admitting: Internal Medicine

## 2022-10-01 ENCOUNTER — Encounter
Admission: EM | Disposition: A | Discharge status: Home / Self Care | Payer: Self-pay | Source: Home / Self Care | Attending: Emergency Medicine

## 2022-10-01 DIAGNOSIS — I10 Essential (primary) hypertension: Secondary | ICD-10-CM | POA: Diagnosis present

## 2022-10-01 DIAGNOSIS — N182 Chronic kidney disease, stage 2 (mild): Secondary | ICD-10-CM | POA: Diagnosis present

## 2022-10-01 DIAGNOSIS — N209 Urinary calculus, unspecified: Secondary | ICD-10-CM | POA: Insufficient documentation

## 2022-10-01 DIAGNOSIS — N201 Calculus of ureter: Secondary | ICD-10-CM | POA: Diagnosis present

## 2022-10-01 DIAGNOSIS — E669 Obesity, unspecified: Secondary | ICD-10-CM | POA: Diagnosis present

## 2022-10-01 DIAGNOSIS — N1330 Unspecified hydronephrosis: Secondary | ICD-10-CM | POA: Diagnosis present

## 2022-10-01 DIAGNOSIS — N390 Urinary tract infection, site not specified: Secondary | ICD-10-CM

## 2022-10-01 DIAGNOSIS — R7303 Prediabetes: Secondary | ICD-10-CM | POA: Diagnosis present

## 2022-10-01 DIAGNOSIS — N23 Unspecified renal colic: Secondary | ICD-10-CM

## 2022-10-01 DIAGNOSIS — R112 Nausea with vomiting, unspecified: Secondary | ICD-10-CM

## 2022-10-01 DIAGNOSIS — K219 Gastro-esophageal reflux disease without esophagitis: Secondary | ICD-10-CM | POA: Diagnosis present

## 2022-10-01 DIAGNOSIS — R52 Pain, unspecified: Secondary | ICD-10-CM

## 2022-10-01 LAB — COMPREHENSIVE METABOLIC PANEL
ALT: 19 U/L (ref 0–44)
AST: 24 U/L (ref 15–41)
Albumin: 4.4 g/dL (ref 3.5–5.0)
Alkaline Phosphatase: 55 U/L (ref 38–126)
Anion gap: 9 (ref 5–15)
BUN: 18 mg/dL (ref 8–23)
CO2: 24 mmol/L (ref 22–32)
Calcium: 9 mg/dL (ref 8.9–10.3)
Chloride: 107 mmol/L (ref 98–111)
Creatinine, Ser: 1.14 mg/dL — ABNORMAL HIGH (ref 0.44–1.00)
GFR, Estimated: 55 mL/min — ABNORMAL LOW (ref >60)
Glucose, Bld: 159 mg/dL — ABNORMAL HIGH (ref 70–99)
Potassium: 3.7 mmol/L (ref 3.5–5.1)
Sodium: 140 mmol/L (ref 135–145)
Total Bilirubin: 0.4 mg/dL (ref 0.3–1.2)
Total Protein: 6.9 g/dL (ref 6.5–8.1)

## 2022-10-01 LAB — LIPASE, BLOOD: Lipase: 41 U/L (ref 11–51)

## 2022-10-01 SURGERY — CYSTOSCOPY/URETEROSCOPY/HOLMIUM LASER/STENT PLACEMENT
Anesthesia: General

## 2022-10-01 MED ORDER — HYDROCODONE-ACETAMINOPHEN 5-325 MG PO TABS: 1.0000 | ORAL_TABLET | ORAL | Status: DC | PRN

## 2022-10-01 MED ORDER — ONDANSETRON 4 MG PO TBDP: 4.0000 mg | ORAL_TABLET | Freq: Three times a day (TID) | ORAL | 0 refills | Status: DC | PRN

## 2022-10-01 MED ORDER — SODIUM CHLORIDE 0.9 % IV SOLN: INTRAVENOUS | Status: DC

## 2022-10-01 MED ORDER — ONDANSETRON HCL 4 MG/2ML IJ SOLN: 4.0000 mg | Freq: Three times a day (TID) | INTRAMUSCULAR | Status: DC | PRN

## 2022-10-01 MED ORDER — SODIUM CHLORIDE 0.9 % IV SOLN
1.0000 g | Freq: Once | INTRAVENOUS | Status: AC
  Administered 2022-10-01: 1 g via INTRAVENOUS
  Filled 2022-10-01: qty 10

## 2022-10-01 MED ORDER — PANTOPRAZOLE SODIUM 40 MG PO TBEC: 40.0000 mg | DELAYED_RELEASE_TABLET | Freq: Every day | ORAL | Status: DC

## 2022-10-01 MED ORDER — SODIUM CHLORIDE 0.9 % IV BOLUS
1000.0000 mL | Freq: Once | INTRAVENOUS | Status: AC
  Administered 2022-10-01: 1000 mL via INTRAVENOUS

## 2022-10-01 MED ORDER — IRBESARTAN 150 MG PO TABS
300.0000 mg | ORAL_TABLET | Freq: Every day | ORAL | Status: DC
  Filled 2022-10-01: qty 2

## 2022-10-01 MED ORDER — HYDROCODONE-ACETAMINOPHEN 5-325 MG PO TABS
1.0000 | ORAL_TABLET | Freq: Once | ORAL | Status: AC
  Administered 2022-10-01: 1 via ORAL
  Filled 2022-10-01: qty 1

## 2022-10-01 MED ORDER — ONDANSETRON HCL 4 MG/2ML IJ SOLN
4.0000 mg | Freq: Once | INTRAMUSCULAR | Status: AC
  Administered 2022-10-01: 4 mg via INTRAVENOUS
  Filled 2022-10-01: qty 2

## 2022-10-01 MED ORDER — SODIUM CHLORIDE 0.9 % IV SOLN: 1.0000 g | INTRAVENOUS | Status: DC

## 2022-10-01 MED ORDER — HYDROCODONE-ACETAMINOPHEN 5-325 MG PO TABS: 1.0000 | ORAL_TABLET | Freq: Four times a day (QID) | ORAL | 0 refills | Status: DC | PRN

## 2022-10-01 MED ORDER — HYDROMORPHONE HCL 1 MG/ML IJ SOLN: 0.5000 mg | INTRAMUSCULAR | Status: DC | PRN

## 2022-10-01 MED ORDER — KETOROLAC TROMETHAMINE 15 MG/ML IJ SOLN
15.0000 mg | Freq: Once | INTRAMUSCULAR | Status: AC
  Administered 2022-10-01: 15 mg via INTRAVENOUS
  Filled 2022-10-01: qty 1

## 2022-10-01 MED ORDER — CEPHALEXIN 500 MG PO CAPS
500.0000 mg | ORAL_CAPSULE | Freq: Two times a day (BID) | ORAL | 0 refills | Status: DC
  Filled 2022-10-01: qty 5, 3d supply, fill #0
  Filled 2022-10-01: qty 10, 5d supply, fill #0

## 2022-10-01 MED ORDER — SODIUM CHLORIDE 0.9 % IV SOLN
12.5000 mg | Freq: Once | INTRAVENOUS | Status: AC
  Administered 2022-10-01: 12.5 mg via INTRAVENOUS
  Filled 2022-10-01: qty 0.5

## 2022-10-01 MED ORDER — ACETAMINOPHEN 325 MG PO TABS: 650.0000 mg | ORAL_TABLET | Freq: Four times a day (QID) | ORAL | Status: DC | PRN

## 2022-10-01 MED ORDER — HYDROMORPHONE HCL 1 MG/ML IJ SOLN: 0.5000 mg | Freq: Once | INTRAMUSCULAR | Status: DC

## 2022-10-01 MED ORDER — ONDANSETRON 4 MG PO TBDP
4.0000 mg | ORAL_TABLET | Freq: Three times a day (TID) | ORAL | 0 refills | Status: DC | PRN
  Filled 2022-10-01: qty 20, 7d supply, fill #0

## 2022-10-01 MED ORDER — HYDROMORPHONE HCL 1 MG/ML IJ SOLN
0.5000 mg | Freq: Once | INTRAMUSCULAR | Status: AC
  Administered 2022-10-01: 0.5 mg via INTRAVENOUS
  Filled 2022-10-01: qty 0.5

## 2022-10-01 MED ORDER — HYDRALAZINE HCL 20 MG/ML IJ SOLN: 5.0000 mg | INTRAMUSCULAR | Status: DC | PRN

## 2022-10-01 SURGICAL SUPPLY — 30 items
ADH LQ OCL WTPRF AMP STRL LF (MISCELLANEOUS)
ADHESIVE MASTISOL STRL (MISCELLANEOUS) IMPLANT
BAG DRAIN SIEMENS DORNER NS (MISCELLANEOUS) ×2 IMPLANT
BAG DRN NS LF (MISCELLANEOUS) ×1
BAG PRESSURE INF REUSE 3000 (BAG) ×2 IMPLANT
BRUSH SCRUB EZ 1% IODOPHOR (MISCELLANEOUS) ×2 IMPLANT
CATH URET FLEX-TIP 2 LUMEN 10F (CATHETERS) IMPLANT
CATH URETL OPEN 5X70 (CATHETERS) IMPLANT
CNTNR URN SCR LID CUP LEK RST (MISCELLANEOUS) IMPLANT
CONT SPEC 4OZ STRL OR WHT (MISCELLANEOUS)
DRAPE UTILITY 15X26 TOWEL STRL (DRAPES) ×2 IMPLANT
DRSG TEGADERM 2-3/8X2-3/4 SM (GAUZE/BANDAGES/DRESSINGS) IMPLANT
FIBER LASER MOSES 200 DFL (Laser) IMPLANT
GLOVE BIOGEL PI IND STRL 7.5 (GLOVE) ×2 IMPLANT
GOWN STRL REUS W/ TWL LRG LVL3 (GOWN DISPOSABLE) ×2 IMPLANT
GOWN STRL REUS W/ TWL XL LVL3 (GOWN DISPOSABLE) ×2 IMPLANT
GOWN STRL REUS W/TWL LRG LVL3 (GOWN DISPOSABLE) ×1
GOWN STRL REUS W/TWL XL LVL3 (GOWN DISPOSABLE) ×1
GUIDEWIRE STR DUAL SENSOR (WIRE) ×2 IMPLANT
IV NS IRRIG 3000ML ARTHROMATIC (IV SOLUTION) ×2 IMPLANT
KIT TURNOVER CYSTO (KITS) ×2 IMPLANT
PACK CYSTO AR (MISCELLANEOUS) ×2 IMPLANT
SET CYSTO W/LG BORE CLAMP LF (SET/KITS/TRAYS/PACK) ×2 IMPLANT
SHEATH NAVIGATOR HD 12/14X36 (SHEATH) IMPLANT
STENT URET 6FRX24 CONTOUR (STENTS) IMPLANT
STENT URET 6FRX26 CONTOUR (STENTS) IMPLANT
SURGILUBE 2OZ TUBE FLIPTOP (MISCELLANEOUS) ×2 IMPLANT
SYR 10ML LL (SYRINGE) ×2 IMPLANT
VALVE UROSEAL ADJ ENDO (VALVE) IMPLANT
WATER STERILE IRR 500ML POUR (IV SOLUTION) ×2 IMPLANT

## 2022-10-01 NOTE — Discharge Summary (Signed)
Physician Discharge Summary  Paula Parker WGN:[id number] DOB: 24-Jan-1961 DOA: 10/01/2022  PCP: Etta Grandchild, MD  Admit date: 10/01/2022 Discharge date: 10/01/2022  Recommendations for Outpatient Follow-up:  Follow up with your PCP in 1 week Check BMP for renal function at follow up visit Follow up urine culture results  Home Health: none Equipment/Devices: none  Discharge Condition: stable CODE STATUS: fall code  Diet recommendation: heart healthy diet  Brief/Interim Summary (HPI): Paula Parker is a 62 y.o. female with medical history significant of HTN,HLD, pre-DM, CKD-2, obesity, NASH, hiatal hernia, gallstone, pseudomembranous colitis, who presents with right flank pain.   Patient states that her right flank pain started last night, which is constant, severe, nonradiating, not aggravated or alleviated by any known factors.  Associated with nausea and multiple episodes of nonbilious nonbloody vomiting.  No diarrhea.  No hematuria.  Denies fever or chills.  Patient reports increased urinary frequency which she attributes to drinking a lot of water, no dysuria or burning or urination.   Data reviewed independently and ED Course: pt was found to have WBC 7.5, renal function close to baseline, positive urinalysis but with squamous cell contamination (hazy appearance, large amount of leukocyte, rare bacteria, WBC > 50, squamous cell 11-20).  Temperature normal, blood pressure 126/76, heart rate 58, RR 18, oxygen saturation 100% on room air.  Patient is placed on MedSurg bed for patient, Dr. Richardo Hanks of urology is consulted.   CT-renal stone protocol 1. Obstructing 2 mm calculus at the right ureterovesicular junction resulting in mild right hydronephrosis. 2. Moderate distal colonic diverticulosis without superimposed acute inflammatory change.   Subjective  -Right flank pain, increased urinary frequency  Discharge Diagnoses and Hospital Course:   Principal Problem:    Calculus of ureterovesical junction (UVJ) Active Problems:   Hydronephrosis of right kidney   HTN (hypertension)   Prediabetes   CKD (chronic kidney disease) stage 2, GFR 60-89 ml/min   GERD (gastroesophageal reflux disease)   Obesity (BMI 30-39.9)    Calculus of ureterovesical junction (UVJ) and hydronephrosis of right kidney: pt has positive urinalysis but with squamous cell contamination. She has increased urinary frequency, started IV rocephin. Consulted Dr. Richardo Hanks of urology. While planning to do stent placement, pt passed her stone in preop and this was clearly identified with a strainer per Dr. Richardo Hanks, therefore pt is Okay to be discharged home on Keflex 500 mg twice daily per Dr. Richardo Hanks.  -I sent Keflex prescription to her pharmacy. -Patient is instructed to follow-up with PCP in 1 week to f/u urine culture results. -Per Dr. Richardo Hanks, patient does not need to follow-up with urologist.   HTN (hypertension): -Benicar    Prediabetes: Recent A1c 6.0. -Follow-up with PCP   CKD (chronic kidney disease) stage 2, GFR 60-89 ml/min: Renal function close to baseline.  Recent baseline creatinine 0.84 on 06/02/2022.  Her creatinine is 1.14, BUN 18, GFR 55 -IV fluid was given in the hospital -Monitor renal function closely by BMP at follow-up visit   GERD (gastroesophageal reflux disease): -Protonix   Obesity (BMI 30-39.9): Body weight 81.2 kg, BMI 33.82 -Encourage losing weight -Exercise/healthy diet    Discharge Instructions:  You were cared for by a hospitalist during your hospital stay. If you have any questions about your discharge medications or the care you received while you were in the hospital after you are discharged, you can call the unit and ask to speak with the hospitalist on call if the hospitalist that took care of you  is not available. Once you are discharged, your primary care physician will handle any further medical issues. Please note that NO REFILLS for any  discharge medications will be authorized once you are discharged, as it is imperative that you return to your primary care physician (or establish a relationship with a primary care physician if you do not have one) for your aftercare needs so that they can reassess your need for medications and monitor your lab values.  Follow up with your PCP in one week.  Take all medications as prescribed. If symptoms change or worsen please return to the ED for evaluation  Please take antibiotic, Keflex 500 mg twice a day for 5 days   Discharge Instructions     Call MD for:  persistant nausea and vomiting   Complete by: As directed    Call MD for:  severe uncontrolled pain   Complete by: As directed    Call MD for:  temperature >100.4   Complete by: As directed    Diet - low sodium heart healthy   Complete by: As directed    Increase activity slowly   Complete by: As directed       Allergies as of 10/01/2022       Reactions   Doxycycline Other (See Comments)   Throat swelling   Fish Allergy    Pt ate flounder and calves swelled   Oxycodone Other (See Comments)   Sweating    Benzonatate Rash, Other (See Comments)   Penicillins Rash   Has patient had a PCN reaction causing immediate rash, facial/tongue/throat swelling, SOB or lightheadedness with hypotension: Yes Has patient had a PCN reaction causing severe rash involving mucus membranes or skin necrosis: No Has patient had a PCN reaction that required hospitalization: No Has patient had a PCN reaction occurring within the last 10 years: Yes If all of the above answers are "NO", then may proceed with Cephalosporin use. Tolerated Rocephin   Sulfonamide Derivatives Rash, Other (See Comments)   unknown        Medication List     STOP taking these medications    clobetasol ointment 0.05 % Commonly known as: TEMOVATE   HYDROcodone bit-homatropine 5-1.5 MG/5ML syrup Commonly known as: HYCODAN       TAKE these medications     cephALEXin 500 MG capsule Commonly known as: Keflex Take 1 capsule (500 mg total) by mouth 2 (two) times daily.   Cholecalciferol 50 MCG (2000 UT) Tabs Take 1 tablet (2,000 Units total) by mouth daily.   esomeprazole 40 MG capsule Commonly known as: NEXIUM Take 1 capsule (40 mg total) by mouth daily at 12 noon.   olmesartan 40 MG tablet Commonly known as: Benicar Take 1 tablet (40 mg total) by mouth daily.   ondansetron 4 MG disintegrating tablet Commonly known as: ZOFRAN-ODT Take 1 tablet (4 mg total) by mouth every 8 (eight) hours as needed for nausea or vomiting.        Follow-up Information     Etta Grandchild, MD Follow up on 10/13/2022.   Specialty: Internal Medicine Why: 9:00 AM, Follow-Up Contact information: [address] [place] [address] [phone]                Allergies  Allergen Reactions   Doxycycline Other (See Comments)    Throat swelling   Fish Allergy     Pt ate flounder and calves swelled   Oxycodone Other (See Comments)    Sweating  Benzonatate Rash and Other (See Comments)   Penicillins Rash    Has patient had a PCN reaction causing immediate rash, facial/tongue/throat swelling, SOB or lightheadedness with hypotension: Yes Has patient had a PCN reaction causing severe rash involving mucus membranes or skin necrosis: No Has patient had a PCN reaction that required hospitalization: No Has patient had a PCN reaction occurring within the last 10 years: Yes If all of the above answers are "NO", then may proceed with Cephalosporin use.  Tolerated Rocephin     Sulfonamide Derivatives Rash and Other (See Comments)    unknown    Consultations: urology   Procedures/Studies: CT Renal Stone Study  Result Date: 10/01/2022 CLINICAL DATA:  Right flank pain EXAM: CT ABDOMEN AND PELVIS WITHOUT CONTRAST TECHNIQUE: Multidetector CT imaging of the abdomen and pelvis was performed following the standard protocol without IV  contrast. RADIATION DOSE REDUCTION: This exam was performed according to the departmental dose-optimization program which includes automated exposure control, adjustment of the mA and/or kV according to patient size and/or use of iterative reconstruction technique. COMPARISON:  None Available. FINDINGS: Lower chest: No acute abnormality. Hepatobiliary: No focal liver abnormality is seen. Status post cholecystectomy. No biliary dilatation. Pancreas: Unremarkable Spleen: Un remark Adrenals/Urinary Tract: The adrenal glands are unremarkable. The kidneys are normal in size and position. Mild right hydronephrosis and hydroureter to the level of the right ureterovesicular junction where an obstructing 2 mm calculus is seen. No additional nephro or urolithiasis identified. No hydronephrosis on the left. No perinephric fluid collections. The bladder is decompressed and is unremarkable. Stomach/Bowel: Moderate descending and sigmoid colonic diverticulosis. Stomach, small bowel, and large bowel are otherwise unremarkable. No evidence of obstruction or focal inflammation. Appendix normal. No free intraperitoneal gas or fluid. Vascular/Lymphatic: No significant vascular findings are present. No enlarged abdominal or pelvic lymph nodes. Reproductive: Uterus and bilateral adnexa are unremarkable. Other: No abdominal wall hernia. Musculoskeletal: No acute bone abnormality. No lytic or blastic bone lesion. IMPRESSION: 1. Obstructing 2 mm calculus at the right ureterovesicular junction resulting in mild right hydronephrosis. 2. Moderate distal colonic diverticulosis without superimposed acute inflammatory change. Electronically Signed   By: Helyn Numbers M.D.   On: 10/01/2022 00:27   DG Finger Little Left  Result Date: 09/29/2022 CLINICAL DATA:  Left small finger pain after injury EXAM: LEFT FINGER(S) - 2+ VIEW COMPARISON:  None Available. FINDINGS: There is no evidence of fracture or dislocation. Mild joint space narrowing at  the DIP joint. Soft tissues are unremarkable. IMPRESSION: Negative. Electronically Signed   By: Duanne Guess D.O.   On: 09/29/2022 09:16      Discharge Exam: Vitals:   10/01/22 1240 10/01/22 1446  BP: 130/71 134/71  Pulse: 67 68  Resp: 17 16  Temp: (!) 97.1 F (36.2 C) 98.1 F (36.7 C)  SpO2: 98% 98%   Vitals:   10/01/22 1225 10/01/22 1228 10/01/22 1240 10/01/22 1446  BP: 118/78  130/71 134/71  Pulse: (!) 58  67 68  Resp:   17 16  Temp:  (!) 97.4 F (36.3 C) (!) 97.1 F (36.2 C) 98.1 F (36.7 C)  TempSrc:  Oral Temporal Temporal  SpO2: 99%  98% 98%  Weight:   81.2 kg   Height:   5\' 1"  (1.549 m)     General: Not in acute distress HEENT:       Eyes: PERRL, EOMI, no scleral icterus.       ENT: No discharge from the ears and nose, no pharynx injection,  no tonsillar enlargement.        Neck: No JVD, no bruit, no mass felt. Heme: No neck lymph node enlargement. Cardiac: S1/S2, RRR, No murmurs, No gallops or rubs. Respiratory: No rales, wheezing, rhonchi or rubs. GI: Soft, nondistended, nontender, no rebound pain, no organomegaly, BS present. GU: No hematuria Ext: No pitting leg edema bilaterally. 1+DP/PT pulse bilaterally. Musculoskeletal: No joint deformities, No joint redness or warmth, no limitation of ROM in spin.  Has positive right CVA tenderness Skin: No rashes.  Neuro: Alert, oriented X3, cranial nerves II-XII grossly intact, moves all extremities normally. Psych: Patient is not psychotic, no suicidal or hemocidal ideation.     The results of significant diagnostics from this hospitalization (including imaging, microbiology, ancillary and laboratory) are listed below for reference.     Microbiology: No results found for this or any previous visit (from the past 240 hour(s)).   Labs: BNP (last 3 results) No results for input(s): "BNP" in the last 8760 hours. Basic Metabolic Panel: Recent Labs  Lab 09/30/22 2337  NA 140  K 3.7  CL 107  CO2 24   GLUCOSE 159*  BUN 18  CREATININE 1.14*  CALCIUM 9.0   Liver Function Tests: Recent Labs  Lab 09/30/22 2337  AST 24  ALT 19  ALKPHOS 55  BILITOT 0.4  PROT 6.9  ALBUMIN 4.4   Recent Labs  Lab 09/30/22 2337  LIPASE 41   No results for input(s): "AMMONIA" in the last 168 hours. CBC: Recent Labs  Lab 09/30/22 2337  WBC 7.5  HGB 13.5  HCT 42.7  MCV 98.8  PLT 263   Cardiac Enzymes: No results for input(s): "CKTOTAL", "CKMB", "CKMBINDEX", "TROPONINI" in the last 168 hours. BNP: Invalid input(s): "POCBNP" CBG: No results for input(s): "GLUCAP" in the last 168 hours. D-Dimer No results for input(s): "DDIMER" in the last 72 hours. Hgb A1c No results for input(s): "HGBA1C" in the last 72 hours. Lipid Profile No results for input(s): "CHOL", "HDL", "LDLCALC", "TRIG", "CHOLHDL", "LDLDIRECT" in the last 72 hours. Thyroid function studies No results for input(s): "TSH", "T4TOTAL", "T3FREE", "THYROIDAB" in the last 72 hours.  Invalid input(s): "FREET3" Anemia work up No results for input(s): "VITAMINB12", "FOLATE", "FERRITIN", "TIBC", "IRON", "RETICCTPCT" in the last 72 hours. Urinalysis    Component Value Date/Time   COLORURINE YELLOW (A) 09/30/2022 2337   APPEARANCEUR HAZY (A) 09/30/2022 2337   LABSPEC 1.024 09/30/2022 2337   PHURINE 5.0 09/30/2022 2337   GLUCOSEU NEGATIVE 09/30/2022 2337   GLUCOSEU NEGATIVE 06/02/2022 0906   HGBUR SMALL (A) 09/30/2022 2337   BILIRUBINUR NEGATIVE 09/30/2022 2337   BILIRUBINUR negative 01/26/2016 0835   KETONESUR NEGATIVE 09/30/2022 2337   PROTEINUR NEGATIVE 09/30/2022 2337   UROBILINOGEN 0.2 06/02/2022 0906   NITRITE NEGATIVE 09/30/2022 2337   LEUKOCYTESUR LARGE (A) 09/30/2022 2337   Sepsis Labs Recent Labs  Lab 09/30/22 2337  WBC 7.5   Microbiology No results found for this or any previous visit (from the past 240 hour(s)).  Time coordinating discharge:  25 minutes.  SIGNED:  Lorretta Harp, MD [facility] 10/01/2022, 6:41 PM   If 7PM-7AM, please contact night-coverage [email or web address]

## 2022-10-01 NOTE — Progress Notes (Signed)
Transition of Care [facility]) - Inpatient Brief Assessment   Patient Details  Name: Paula Parker MRN: [id number] Date of Birth: 12-21-1960  [facility] CM/SW Contact:    Darolyn Rua, LCSW Phone Number: 10/01/2022, 10:14 AM   Clinical Narrative:  Patient presented to ED from home with family, reports right flank pain . Patient has [facility] insurance, PCP Dr. Sanda Linger. Please consult TOC should discharge planning needs arise.   Transition of Care Asessment: Insurance and Status: Insurance coverage has been reviewed Patient has primary care physician: Yes Home environment has been reviewed: from home with family support Prior level of function:: independent Prior/Current Home Services: No current home services Social Determinants of Health Reivew: SDOH reviewed no interventions necessary Readmission risk has been reviewed: Yes Transition of care needs: no transition of care needs at this time  Juan Quam, MSW, [place] [phone]

## 2022-10-01 NOTE — Progress Notes (Signed)
Patient passed her stone in preop and this was clearly identified with a strainer.  I personally examined the stone, and this was consistent with a 2 mm stone at the right UVJ.  She denied any symptoms or fevers.  Okay for discharge from a urology perspective.  Would recommend 5 days of antibiotic and follow-up cultures, return precautions were discussed.  We discussed general stone prevention strategies including adequate hydration with goal of producing 2.5 L of urine daily, increasing citric acid intake, increasing calcium intake during high oxalate meals, minimizing animal protein, and decreasing salt intake. Information about dietary recommendations given today.   Follow-up with urology as needed  Legrand Rams, MD 10/01/2022

## 2022-10-01 NOTE — Progress Notes (Signed)
Patient arrived to [facility] pre-op via stretcher from ED. Patient has renal stone in specimen cup that came up with her. Dr Richardo Hanks called and notified, MD plans on cancelling procedure.

## 2022-10-01 NOTE — Discharge Instructions (Addendum)
You were cared for by a hospitalist during your hospital stay. If you have any questions about your discharge medications or the care you received while you were in the hospital after you are discharged, you can call the unit and ask to speak with the hospitalist on call if the hospitalist that took care of you is not available. Once you are discharged, your primary care physician will handle any further medical issues. Please note that NO REFILLS for any discharge medications will be authorized once you are discharged, as it is imperative that you return to your primary care physician (or establish a relationship with a primary care physician if you do not have one) for your aftercare needs so that they can reassess your need for medications and monitor your lab values.  Follow up with your PCP in one week.  Take all medications as prescribed. If symptoms change or worsen please return to the ED for evaluation  Please take antibiotic, Keflex 500 mg twice a day for 5 days You do not need to follow up with urologist

## 2022-10-01 NOTE — ED Provider Notes (Signed)
[facility] Provider Note    Event Date/Time   First MD Initiated Contact with Patient 10/01/22 0102     (approximate)   History   Flank Pain   HPI  Paula Parker is a 63 y.o. female who presents to the ED from home with a chief complaint of right flank/lower quadrant abdominal pain which began suddenly this evening associated with nausea and vomiting.  Denies fever/chills, chest pain, shortness of breath, hematuria.  Never history of kidney stones.     Past Medical History   Past Medical History:  Diagnosis Date   Fatty liver    Gallstones    GERD (gastroesophageal reflux disease)    GLAUCOMA, BORDERLINE    Hiatal hernia    HTN (hypertension)    HYPERLIPIDEMIA    HYPERTENSION    PALPITATIONS, OCCASIONAL    Prediabetes    Pseudomembranous colitis    SMOKER      Active Problem List   Patient Active Problem List   Diagnosis Date Noted   Acute right-sided low back pain with right-sided sciatica 06/10/2022   Claudication of both lower extremities (HCC) 06/06/2022   Chronic renal disease, stage 2, mildly decreased glomerular filtration rate (GFR) between 60-89 mL/min/1.73 square meter 06/06/2022   Varicose veins of both lower extremities with pain 06/02/2022   Encounter for general adult medical examination with abnormal findings 06/02/2022   Panic anxiety syndrome 11/25/2021   Mild intermittent asthma with acute exacerbation 08/12/2020   Age-related osteoporosis with current pathological fracture 05/15/2020   Prediabetes 05/15/2020   Gastroesophageal reflux disease without esophagitis 08/09/2019   Nonalcoholic steatohepatitis (NASH) 11/02/2018   Irritable bowel syndrome with diarrhea 10/10/2018   Visit for screening mammogram 04/27/2018   Vitamin D deficiency disease 10/11/2017   DJD (degenerative joint disease), ankle and foot, left 07/15/2016   Allergic rhinitis 07/19/2013   Colon cancer screening 04/17/2013   Atrophic vaginitis  02/03/2012   Routine general medical examination at a health care facility 01/24/2012   GLAUCOMA, BORDERLINE 04/27/2010   HIATAL HERNIA 04/27/2010   Essential hypertension, benign 06/02/2007     Past Surgical History   Past Surgical History:  Procedure Laterality Date   CERVIX LESION DESTRUCTION  1980'S   CHOLECYSTECTOMY N/A 02/03/2018   Procedure: LAPAROSCOPIC CHOLECYSTECTOMY;  Surgeon: Andria Meuse, MD;  Location: [facility];  Service: General;  Laterality: N/A;   CHOLECYSTECTOMY     COLONOSCOPY     ESOPHAGOGASTRODUODENOSCOPY     OVARY SURGERY     removed precancerous cells with a laser     Home Medications   Prior to Admission medications   Medication Sig Start Date End Date Taking? Authorizing Provider  HYDROcodone-acetaminophen (NORCO) 5-325 MG tablet Take 1 tablet by mouth every 6 (six) hours as needed for moderate pain. 10/01/22  Yes Irean Hong, MD  ondansetron (ZOFRAN-ODT) 4 MG disintegrating tablet Take 1 tablet (4 mg total) by mouth every 8 (eight) hours as needed for nausea or vomiting. 10/01/22  Yes Irean Hong, MD  Cholecalciferol 50 MCG (2000 UT) TABS Take 1 tablet (2,000 Units total) by mouth daily. 10/10/18   Etta Grandchild, MD  clobetasol ointment (TEMOVATE) 0.05 % Apply 1 application topically 2 (two) times daily  for 1 week, then daily for 1 week, then every other day for 1 week, then twice weekly 11/05/21   Wyline Beady A, NP  esomeprazole (NEXIUM) 40 MG capsule Take 1 capsule (40 mg total) by mouth daily at 12  noon. 07/13/22   Etta Grandchild, MD  HYDROcodone bit-homatropine (HYCODAN) 5-1.5 MG/5ML syrup Take 5 mLs by mouth every 8 (eight) hours as needed for cough. Patient not taking: Reported on 09/27/2022 08/31/22   Pincus Sanes, MD  olmesartan (BENICAR) 40 MG tablet Take 1 tablet (40 mg total) by mouth daily. 08/16/22   Etta Grandchild, MD     Allergies  Doxycycline, Fish allergy, Oxycodone, Benzonatate, Penicillins, and Sulfonamide  derivatives   Family History   Family History  Problem Relation Age of Onset   Ovarian cancer Sister 74   Heart disease Sister    Early death Sister    ALS Mother    Heart disease Father    Coronary artery disease Other        female first degree <50   Diabetes Maternal Grandfather    Diabetes Maternal Grandmother    Stroke Neg Hx    Kidney disease Neg Hx    Hypertension Neg Hx    Hyperlipidemia Neg Hx    Osteoporosis Neg Hx      Physical Exam  Triage Vital Signs: ED Triage Vitals  Enc Vitals Group     BP 09/30/22 2333 (!) 166/89     Pulse Rate 09/30/22 2333 76     Resp 09/30/22 2333 18     Temp 09/30/22 2333 97.6 F (36.4 C)     Temp Source 09/30/22 2333 Oral     SpO2 09/30/22 2333 100 %     Weight --      Height --      Head Circumference --      Peak Flow --      Pain Score 09/30/22 2335 10     Pain Loc --      Pain Edu? --      Excl. in GC? --     Updated Vital Signs: BP 126/76   Pulse (!) 58   Temp 97.6 F (36.4 C) (Oral)   Resp 18   SpO2 95%    General: Awake, mild distress.  CV:  RRR.  Good peripheral perfusion.  Resp:  Normal effort.  CTAB. Abd:  Mild right CVAT.  Minimal right lower quadrant tenderness to palpation without rebound or guarding.  No distention.  Other:  No truncal vesicles.   ED Results / Procedures / Treatments  Labs (all labs ordered are listed, but only abnormal results are displayed) Labs Reviewed  COMPREHENSIVE METABOLIC PANEL - Abnormal; Notable for the following components:      Result Value   Glucose, Bld 159 (*)    Creatinine, Ser 1.14 (*)    GFR, Estimated 55 (*)    All other components within normal limits  URINALYSIS, ROUTINE W REFLEX MICROSCOPIC - Abnormal; Notable for the following components:   Color, Urine YELLOW (*)    APPearance HAZY (*)    Hgb urine dipstick SMALL (*)    Leukocytes,Ua LARGE (*)    Bacteria, UA RARE (*)    Non Squamous Epithelial PRESENT (*)    All other components within normal  limits  URINE CULTURE  LIPASE, BLOOD  CBC     EKG  None   RADIOLOGY I have independently visualized and interpreted patient's CT scan as well as noted the radiology interpretation:  CT renal stone study: Obstructing 2 mm calculus at right UVJ with mild right hydronephrosis  Official radiology report(s): CT Renal Stone Study  Result Date: 10/01/2022 CLINICAL DATA:  Right flank pain EXAM: CT ABDOMEN  AND PELVIS WITHOUT CONTRAST TECHNIQUE: Multidetector CT imaging of the abdomen and pelvis was performed following the standard protocol without IV contrast. RADIATION DOSE REDUCTION: This exam was performed according to the departmental dose-optimization program which includes automated exposure control, adjustment of the mA and/or kV according to patient size and/or use of iterative reconstruction technique. COMPARISON:  None Available. FINDINGS: Lower chest: No acute abnormality. Hepatobiliary: No focal liver abnormality is seen. Status post cholecystectomy. No biliary dilatation. Pancreas: Unremarkable Spleen: Un remark Adrenals/Urinary Tract: The adrenal glands are unremarkable. The kidneys are normal in size and position. Mild right hydronephrosis and hydroureter to the level of the right ureterovesicular junction where an obstructing 2 mm calculus is seen. No additional nephro or urolithiasis identified. No hydronephrosis on the left. No perinephric fluid collections. The bladder is decompressed and is unremarkable. Stomach/Bowel: Moderate descending and sigmoid colonic diverticulosis. Stomach, small bowel, and large bowel are otherwise unremarkable. No evidence of obstruction or focal inflammation. Appendix normal. No free intraperitoneal gas or fluid. Vascular/Lymphatic: No significant vascular findings are present. No enlarged abdominal or pelvic lymph nodes. Reproductive: Uterus and bilateral adnexa are unremarkable. Other: No abdominal wall hernia. Musculoskeletal: No acute bone abnormality.  No lytic or blastic bone lesion. IMPRESSION: 1. Obstructing 2 mm calculus at the right ureterovesicular junction resulting in mild right hydronephrosis. 2. Moderate distal colonic diverticulosis without superimposed acute inflammatory change. Electronically Signed   By: Helyn Numbers M.D.   On: 10/01/2022 00:27     PROCEDURES:  Critical Care performed: Yes, see critical care procedure note(s)  CRITICAL CARE Performed by: Irean Hong   Total critical care time: 45 minutes  Critical care time was exclusive of separately billable procedures and treating other patients.  Critical care was necessary to treat or prevent imminent or life-threatening deterioration.  Critical care was time spent personally by me on the following activities: development of treatment plan with patient and/or surrogate as well as nursing, discussions with consultants, evaluation of patient's response to treatment, examination of patient, obtaining history from patient or surrogate, ordering and performing treatments and interventions, ordering and review of laboratory studies, ordering and review of radiographic studies, pulse oximetry and re-evaluation of patient's condition.   Marland Kitchen1-3 Lead EKG Interpretation  Performed by: Irean Hong, MD Authorized by: Irean Hong, MD     Interpretation: normal     ECG rate:  75   ECG rate assessment: normal     Rhythm: sinus rhythm     Ectopy: none     Conduction: normal   Comments:     Patient placed on cardiac monitor to evaluate for arrhythmias    MEDICATIONS ORDERED IN ED: Medications  HYDROmorphone (DILAUDID) injection 0.5 mg (has no administration in time range)  ondansetron (ZOFRAN) injection 4 mg (4 mg Intravenous Given 10/01/22 0024)  ketorolac (TORADOL) 15 MG/ML injection 15 mg (15 mg Intravenous Given 10/01/22 0026)  sodium chloride 0.9 % bolus 1,000 mL (1,000 mLs Intravenous New Bag/Given 10/01/22 0216)  ondansetron (ZOFRAN) injection 4 mg (4 mg Intravenous  Given 10/01/22 0215)  HYDROmorphone (DILAUDID) injection 0.5 mg (0.5 mg Intravenous Given 10/01/22 0215)  cefTRIAXone (ROCEPHIN) 1 g in sodium chloride 0.9 % 100 mL IVPB (0 g Intravenous Stopped 10/01/22 0246)  HYDROcodone-acetaminophen (NORCO/VICODIN) 5-325 MG per tablet 1 tablet (1 tablet Oral Given 10/01/22 0512)  promethazine (PHENERGAN) 12.5 mg in sodium chloride 0.9 % 50 mL IVPB (12.5 mg Intravenous New Bag/Given 10/01/22 0511)     IMPRESSION / MDM / ASSESSMENT AND  PLAN / ED COURSE  I reviewed the triage vital signs and the nursing notes.                             62 year old female presenting with right flank/abdominal pain. Differential diagnosis includes, but is not limited to, ovarian cyst, ovarian torsion, acute appendicitis, diverticulitis, urinary tract infection/pyelonephritis, endometriosis, bowel obstruction, colitis, renal colic, gastroenteritis, hernia, etc. I personally reviewed patient's records and notes a PCP office visit on 09/27/2022 for finger pain.  Patient's presentation is most consistent with acute presentation with potential threat to life or bodily function.  The patient is on the cardiac monitor to evaluate for evidence of arrhythmia and/or significant heart rate changes.  Laboratory results demonstrates normal WBC 7.5, mild AKI creatinine 1.14, leukocyte positive UTI.  CT scan demonstrates 2 mm stone at UVJ.  Patient is afebrile, not tachycardic nor tachypneic.  Low suspicion for sepsis.  Pain initially improved after IV ketorolac and Zofran but pain and nausea are returning.  Will initiate IV fluid resuscitation, IV Dilaudid and Zofran for pain and nausea, start IV Rocephin and reassess.  Clinical Course as of 10/01/22 0538  Fri Oct 01, 2022  0411 Patient feeling better after administration of IV Dilaudid and Zofran.  She has an oxycodone allergy but cannot tolerate hydrocodone.  Also has a sulfa allergy so we will avoid Flomax.  Strict return precautions given.   Patient and family members verbalized understanding and agree with plan of care. [JS]  0533 Pain returned, patient vomiting.  Will consult hospital services for evaluation and admission. [JS]    Clinical Course User Index [JS] Irean Hong, MD     FINAL CLINICAL IMPRESSION(S) / ED DIAGNOSES   Final diagnoses:  Ureteral colic  Lower urinary tract infectious disease  Intractable nausea and vomiting  Intractable pain     Rx / DC Orders   ED Discharge Orders          Ordered    HYDROcodone-acetaminophen (NORCO) 5-325 MG tablet  Every 6 hours PRN        10/01/22 0412    ondansetron (ZOFRAN-ODT) 4 MG disintegrating tablet  Every 8 hours PRN        10/01/22 0412             Note:  This document was prepared using Dragon voice recognition software and may include unintentional dictation errors.   Irean Hong, MD 10/01/22 [phone]

## 2022-10-01 NOTE — Consult Note (Signed)
Urology Consult   I have been asked to see the patient by Dr. Clyde Lundborg, for evaluation and management of right distal ureteral stone, possible UTI, poorly controlled pain.  Chief Complaint: Right flank pain, N/V  HPI:  Paula Parker is a 62 y.o. who developed acute onset of severe right-sided flank and groin pain last night and presented to the ER.  She has no prior history of kidney stones.  CT showed a 2 mm right distal ureteral stone with hydronephrosis, no other renal stones.  UA was equivocal with squamous cells but large leukocytes and greater than 50 WBC, rare bacteria, culture is pending.  Pain has been poorly controlled with IV medications and she was admitted to the hospitalist service.  She denies any UTI symptoms or fevers or chills.  Continues to have flank pain this morning as well as nausea and vomiting.  No leukocytosis, slight bump in creatinine.  PMH: Past Medical History:  Diagnosis Date   Fatty liver    Gallstones    GERD (gastroesophageal reflux disease)    GLAUCOMA, BORDERLINE    Hiatal hernia    HTN (hypertension)    HYPERLIPIDEMIA    HYPERTENSION    PALPITATIONS, OCCASIONAL    Prediabetes    Pseudomembranous colitis    SMOKER     Surgical History: Past Surgical History:  Procedure Laterality Date   CERVIX LESION DESTRUCTION  1980'S   CHOLECYSTECTOMY N/A 02/03/2018   Procedure: LAPAROSCOPIC CHOLECYSTECTOMY;  Surgeon: Andria Meuse, MD;  Location: [facility];  Service: General;  Laterality: N/A;   CHOLECYSTECTOMY     COLONOSCOPY     ESOPHAGOGASTRODUODENOSCOPY     OVARY SURGERY     removed precancerous cells with a laser     Allergies:  Allergies  Allergen Reactions   Doxycycline Other (See Comments)    Throat swelling   Fish Allergy     Pt ate flounder and calves swelled   Oxycodone Other (See Comments)    Sweating    Benzonatate Rash and Other (See Comments)   Penicillins Rash    Has patient had a PCN reaction causing immediate rash,  facial/tongue/throat swelling, SOB or lightheadedness with hypotension: Yes Has patient had a PCN reaction causing severe rash involving mucus membranes or skin necrosis: No Has patient had a PCN reaction that required hospitalization: No Has patient had a PCN reaction occurring within the last 10 years: Yes If all of the above answers are "NO", then may proceed with Cephalosporin use.  Tolerated Rocephin     Sulfonamide Derivatives Rash and Other (See Comments)    unknown    Family History: Family History  Problem Relation Age of Onset   Ovarian cancer Sister 43   Heart disease Sister    Early death Sister    ALS Mother    Heart disease Father    Coronary artery disease Other        female first degree <50   Diabetes Maternal Grandfather    Diabetes Maternal Grandmother    Stroke Neg Hx    Kidney disease Neg Hx    Hypertension Neg Hx    Hyperlipidemia Neg Hx    Osteoporosis Neg Hx     Social History:  reports that she quit smoking about 8 years ago. Her smoking use included cigarettes. She smoked an average of 1 pack per day. She has never used smokeless tobacco. She reports that she does not drink alcohol and does not use drugs.  ROS: Negative aside from those stated in the HPI.  Physical Exam: BP 117/78   Pulse (!) 56   Temp 97.9 F (36.6 C) (Oral)   Resp 17   SpO2 98%    Constitutional:  Alert and oriented, appears uncomfortable Cardiovascular: Regular rate and rhythm Respiratory: Clear to auscultation bilaterally GI: Abdomen is soft, nontender, nondistended, no abdominal masses   Laboratory Data: Reviewed, see HPI  Pertinent Imaging: I have personally reviewed the CT showing a 2 mm right distal ureteral stone with hydronephrosis.  Assessment & Plan:   62 year old female with acute onset of severe right-sided flank pain 09/30/2022, pain unable to be controlled in ER and was admitted to the hospitalist service.  No UTI symptoms or fever, but UA equivocal with  leukocytes, greater than 50 WBC, squamous cells.  No leukocytosis or fever to indicate sepsis.  In the setting of her poorly controlled pain as well as equivocal UA, I recommended considering right ureteroscopy, stone removal, and stent placement today.  We discussed other alternatives like medical expulsive therapy and a trial of Flomax and oral pain medications, but she has not been able to tolerate p.o. and pain has not been controlled even with IV medications.  Risk and benefits discussed extensively, and using shared decision making she opted to pursue right ureteroscopy, laser lithotripsy, stent placement today.  We discussed possible need for delayed ureteroscopy and stone removal if frank purulence found at the time of cystoscopy that would necessitate temporary stent placement and drainage with antibiotics.  We specifically discussed the risks ureteroscopy including bleeding, infection/sepsis, stent related symptoms including flank pain/urgency/frequency/incontinence/dysuria, ureteral injury, inability to access stone, or need for staged or additional procedures.   Recommendations: -Right ureteroscopy, laser lithotripsy, stent placement today -Anticipate can discharge home later today if afebrile   Sondra Come, MD  Total time spent on the floor was 65 minutes, with greater than 50% spent in counseling and coordination of care with the patient regarding right distal ureteral stone, equivocal UA, and treatment options including medical expulsive therapy or ureteroscopy and stent placement.  [facility] [address] [place], [place] [address] [phone]

## 2022-10-01 NOTE — H&P (Signed)
History and Physical    Paula Parker:[id number] DOB: 03/17/1961 DOA: 10/01/2022  Referring MD/NP/PA:   PCP: Etta Grandchild, MD   Patient coming from:  The patient is coming from home.     Chief Complaint: Right flank pain  HPI: Paula Parker is a 62 y.o. female with medical history significant of HTN,HLD, pre-DM, CKD-2, obesity, NASH, hiatal hernia, gallstone, pseudomembranous colitis, who presents with right flank pain.  Patient states that her right flank pain started last night, which is constant, severe, nonradiating, not aggravated or alleviated by any known factors.  Associated with nausea and multiple episodes of nonbilious nonbloody vomiting.  No diarrhea.  No hematuria.  Denies fever or chills.  Patient reports increased urinary frequency which she attributes to drinking a lot of water, no dysuria or burning or urination.  Data reviewed independently and ED Course: pt was found to have WBC 7.5, renal function close to baseline, positive urinalysis but with squamous cell contamination (hazy appearance, large amount of leukocyte, rare bacteria, WBC > 50, squamous cell 11-20).  Temperature normal, blood pressure 126/76, heart rate 58, RR 18, oxygen saturation 100% on room air.  Patient is placed on MedSurg bed for patient, Dr. Richardo Hanks of urology is consulted.  CT-renal stone protocol 1. Obstructing 2 mm calculus at the right ureterovesicular junction resulting in mild right hydronephrosis. 2. Moderate distal colonic diverticulosis without superimposed acute inflammatory change.    EKG:   Not done in ED, will get one.    Review of Systems:   General: no fevers, chills, no body weight gain, has fatigue HEENT: no blurry vision, hearing changes or sore throat Respiratory: no dyspnea, coughing, wheezing CV: no chest pain, no palpitations GI: has nausea, vomiting, right flank pain, no diarrhea, constipation GU: no dysuria, burning on urination, has increased urinary  frequency, no hematuria  Ext: no leg edema Neuro: no unilateral weakness, numbness, or tingling, no vision change or hearing loss Skin: no rash, no skin tear. MSK: No muscle spasm, no deformity, no limitation of range of movement in spin Heme: No easy bruising.  Travel history: No recent long distant travel.   Allergy:  Allergies  Allergen Reactions   Doxycycline Other (See Comments)    Throat swelling   Fish Allergy     Pt ate flounder and calves swelled   Oxycodone Other (See Comments)    Sweating    Benzonatate Rash and Other (See Comments)   Penicillins Rash    Has patient had a PCN reaction causing immediate rash, facial/tongue/throat swelling, SOB or lightheadedness with hypotension: Yes Has patient had a PCN reaction causing severe rash involving mucus membranes or skin necrosis: No Has patient had a PCN reaction that required hospitalization: No Has patient had a PCN reaction occurring within the last 10 years: Yes If all of the above answers are "NO", then may proceed with Cephalosporin use.  Tolerated Rocephin     Sulfonamide Derivatives Rash and Other (See Comments)    unknown    Past Medical History:  Diagnosis Date   Fatty liver    Gallstones    GERD (gastroesophageal reflux disease)    GLAUCOMA, BORDERLINE    Hiatal hernia    HTN (hypertension)    HYPERLIPIDEMIA    HYPERTENSION    PALPITATIONS, OCCASIONAL    Prediabetes    Pseudomembranous colitis    SMOKER     Past Surgical History:  Procedure Laterality Date   CERVIX LESION DESTRUCTION  1980'S  CHOLECYSTECTOMY N/A 02/03/2018   Procedure: LAPAROSCOPIC CHOLECYSTECTOMY;  Surgeon: Andria Meuse, MD;  Location: [facility];  Service: General;  Laterality: N/A;   CHOLECYSTECTOMY     COLONOSCOPY     ESOPHAGOGASTRODUODENOSCOPY     OVARY SURGERY     removed precancerous cells with a laser    Social History:  reports that she quit smoking about 8 years ago. Her smoking use included cigarettes. She  smoked an average of 1 pack per day. She has never used smokeless tobacco. She reports that she does not drink alcohol and does not use drugs.  Family History:  Family History  Problem Relation Age of Onset   Ovarian cancer Sister 29   Heart disease Sister    Early death Sister    ALS Mother    Heart disease Father    Coronary artery disease Other        female first degree <50   Diabetes Maternal Grandfather    Diabetes Maternal Grandmother    Stroke Neg Hx    Kidney disease Neg Hx    Hypertension Neg Hx    Hyperlipidemia Neg Hx    Osteoporosis Neg Hx      Prior to Admission medications   Medication Sig Start Date End Date Taking? Authorizing Provider  Cholecalciferol 50 MCG (2000 UT) TABS Take 1 tablet (2,000 Units total) by mouth daily. 10/10/18  Yes Etta Grandchild, MD  esomeprazole (NEXIUM) 40 MG capsule Take 1 capsule (40 mg total) by mouth daily at 12 noon. 07/13/22  Yes Etta Grandchild, MD  HYDROcodone-acetaminophen (NORCO) 5-325 MG tablet Take 1 tablet by mouth every 6 (six) hours as needed for moderate pain. 10/01/22  Yes Irean Hong, MD  olmesartan (BENICAR) 40 MG tablet Take 1 tablet (40 mg total) by mouth daily. 08/16/22  Yes Etta Grandchild, MD  ondansetron (ZOFRAN-ODT) 4 MG disintegrating tablet Take 1 tablet (4 mg total) by mouth every 8 (eight) hours as needed for nausea or vomiting. 10/01/22  Yes Irean Hong, MD  clobetasol ointment (TEMOVATE) 0.05 % Apply 1 application topically 2 (two) times daily  for 1 week, then daily for 1 week, then every other day for 1 week, then twice weekly Patient not taking: Reported on 10/01/2022 11/05/21   Wyline Beady A, NP  HYDROcodone bit-homatropine (HYCODAN) 5-1.5 MG/5ML syrup Take 5 mLs by mouth every 8 (eight) hours as needed for cough. Patient not taking: Reported on 09/27/2022 08/31/22   Pincus Sanes, MD    Physical Exam: Vitals:   09/30/22 2333 10/01/22 0400 10/01/22 0834 10/01/22 0900  BP: (!) 166/89 126/76 112/69 117/78   Pulse: 76 (!) 58 (!) 52 (!) 56  Resp: 18   17  Temp: 97.6 F (36.4 C)  97.9 F (36.6 C)   TempSrc: Oral  Oral   SpO2: 100% 95% 97% 98%   General: Not in acute distress HEENT:       Eyes: PERRL, EOMI, no jaundice       ENT: No discharge from the ears and nose, no pharynx injection, no tonsillar enlargement.        Neck: No JVD, no bruit, no mass felt. Heme: No neck lymph node enlargement. Cardiac: S1/S2, RRR, No murmurs, No gallops or rubs. Respiratory: No rales, wheezing, rhonchi or rubs. GI: Soft, nondistended, nontender, no rebound pain, no organomegaly, BS present. GU: No hematuria. Has positive right CVA tenderness Ext: No pitting leg edema bilaterally. 1+DP/PT pulse bilaterally. Musculoskeletal: No joint  deformities, No joint redness or warmth, no limitation of ROM in spin. Skin: No rashes.  Neuro: Alert, oriented X3, cranial nerves II-XII grossly intact, moves all extremities normally. Psych: Patient is not psychotic, no suicidal or hemocidal ideation.  Labs on Admission: I have personally reviewed following labs and imaging studies  CBC: Recent Labs  Lab 09/30/22 2337  WBC 7.5  HGB 13.5  HCT 42.7  MCV 98.8  PLT 263   Basic Metabolic Panel: Recent Labs  Lab 09/30/22 2337  NA 140  K 3.7  CL 107  CO2 24  GLUCOSE 159*  BUN 18  CREATININE 1.14*  CALCIUM 9.0   GFR: Estimated Creatinine Clearance: 50.1 mL/min (A) (by C-G formula based on SCr of 1.14 mg/dL (H)). Liver Function Tests: Recent Labs  Lab 09/30/22 2337  AST 24  ALT 19  ALKPHOS 55  BILITOT 0.4  PROT 6.9  ALBUMIN 4.4   Recent Labs  Lab 09/30/22 2337  LIPASE 41   No results for input(s): "AMMONIA" in the last 168 hours. Coagulation Profile: No results for input(s): "INR", "PROTIME" in the last 168 hours. Cardiac Enzymes: No results for input(s): "CKTOTAL", "CKMB", "CKMBINDEX", "TROPONINI" in the last 168 hours. BNP (last 3 results) No results for input(s): "PROBNP" in the last 8760  hours. HbA1C: No results for input(s): "HGBA1C" in the last 72 hours. CBG: No results for input(s): "GLUCAP" in the last 168 hours. Lipid Profile: No results for input(s): "CHOL", "HDL", "LDLCALC", "TRIG", "CHOLHDL", "LDLDIRECT" in the last 72 hours. Thyroid Function Tests: No results for input(s): "TSH", "T4TOTAL", "FREET4", "T3FREE", "THYROIDAB" in the last 72 hours. Anemia Panel: No results for input(s): "VITAMINB12", "FOLATE", "FERRITIN", "TIBC", "IRON", "RETICCTPCT" in the last 72 hours. Urine analysis:    Component Value Date/Time   COLORURINE YELLOW (A) 09/30/2022 2337   APPEARANCEUR HAZY (A) 09/30/2022 2337   LABSPEC 1.024 09/30/2022 2337   PHURINE 5.0 09/30/2022 2337   GLUCOSEU NEGATIVE 09/30/2022 2337   GLUCOSEU NEGATIVE 06/02/2022 0906   HGBUR SMALL (A) 09/30/2022 2337   BILIRUBINUR NEGATIVE 09/30/2022 2337   BILIRUBINUR negative 01/26/2016 0835   KETONESUR NEGATIVE 09/30/2022 2337   PROTEINUR NEGATIVE 09/30/2022 2337   UROBILINOGEN 0.2 06/02/2022 0906   NITRITE NEGATIVE 09/30/2022 2337   LEUKOCYTESUR LARGE (A) 09/30/2022 2337   Sepsis Labs: @LABRCNTIP (procalcitonin:4,lacticidven:4) )No results found for this or any previous visit (from the past 240 hour(s)).   Radiological Exams on Admission: CT Renal Stone Study  Result Date: 10/01/2022 CLINICAL DATA:  Right flank pain EXAM: CT ABDOMEN AND PELVIS WITHOUT CONTRAST TECHNIQUE: Multidetector CT imaging of the abdomen and pelvis was performed following the standard protocol without IV contrast. RADIATION DOSE REDUCTION: This exam was performed according to the departmental dose-optimization program which includes automated exposure control, adjustment of the mA and/or kV according to patient size and/or use of iterative reconstruction technique. COMPARISON:  None Available. FINDINGS: Lower chest: No acute abnormality. Hepatobiliary: No focal liver abnormality is seen. Status post cholecystectomy. No biliary dilatation.  Pancreas: Unremarkable Spleen: Un remark Adrenals/Urinary Tract: The adrenal glands are unremarkable. The kidneys are normal in size and position. Mild right hydronephrosis and hydroureter to the level of the right ureterovesicular junction where an obstructing 2 mm calculus is seen. No additional nephro or urolithiasis identified. No hydronephrosis on the left. No perinephric fluid collections. The bladder is decompressed and is unremarkable. Stomach/Bowel: Moderate descending and sigmoid colonic diverticulosis. Stomach, small bowel, and large bowel are otherwise unremarkable. No evidence of obstruction or focal inflammation. Appendix  normal. No free intraperitoneal gas or fluid. Vascular/Lymphatic: No significant vascular findings are present. No enlarged abdominal or pelvic lymph nodes. Reproductive: Uterus and bilateral adnexa are unremarkable. Other: No abdominal wall hernia. Musculoskeletal: No acute bone abnormality. No lytic or blastic bone lesion. IMPRESSION: 1. Obstructing 2 mm calculus at the right ureterovesicular junction resulting in mild right hydronephrosis. 2. Moderate distal colonic diverticulosis without superimposed acute inflammatory change. Electronically Signed   By: Helyn Numbers M.D.   On: 10/01/2022 00:27      Assessment/Plan Principal Problem:   Calculus of ureterovesical junction (UVJ) Active Problems:   Hydronephrosis of right kidney   HTN (hypertension)   Prediabetes   CKD (chronic kidney disease) stage 2, GFR 60-89 ml/min   GERD (gastroesophageal reflux disease)   Obesity (BMI 30-39.9)   Assessment and Plan:  Calculus of ureterovesical junction (UVJ) and hydronephrosis of right kidney: pt has positive urinalysis but with squamous cell contamination. She has increased urinary frequency, will start antibiotics.  Consulted Dr. Richardo Hanks of urology.  -will place in med-surg bed for obs -Rocephin IV -f/u Urine culture -Pain control: As needed Dilaudid, Norco,  Tylenol -IV fluid: 1 L normal saline, 100 cc/h  HTN (hypertension): -IV hydralazine as needed -Benicar --> switch to irbesartan in hospital  Prediabetes: Recent A1c 6.0. -Check CBG every morning  CKD (chronic kidney disease) stage 2, GFR 60-89 ml/min: Renal function close to baseline.  Recent baseline creatinine 0.84 on 06/02/2022.  Her creatinine is 1.14, BUN 18, GFR 55 -IV fluid as above -Monitor renal function closely by BMP  GERD (gastroesophageal reflux disease): -Protonix  Obesity (BMI 30-39.9): Body weight 81.2 kg, BMI 33.82 -Encourage losing weight -Exercise/healthy diet        DVT ppx: SCD  Code Status: Full code    Family Communication: I offered to call her family, but the patient states that her grandson and her husband already know what is going on for her, she said I do not need to call her family.  Disposition Plan:  Anticipate discharge back to previous environment  Consults called:  Dr. Richardo Hanks of urology  Admission status and Level of care: Med-Surg: for obs   Dispo: The patient is from: Home              Anticipated d/c is to: Home              Anticipated d/c date is: 1 day              Patient currently is not medically stable to d/c.    Severity of Illness:  The appropriate patient status for this patient is OBSERVATION. Observation status is judged to be reasonable and necessary in order to provide the required intensity of service to ensure the patient's safety. The patient's presenting symptoms, physical exam findings, and initial radiographic and laboratory data in the context of their medical condition is felt to place them at decreased risk for further clinical deterioration. Furthermore, it is anticipated that the patient will be medically stable for discharge from the hospital within 2 midnights of admission.        Date of Service 10/01/2022    Lorretta Harp [facility]   If 7PM-7AM, please contact  night-coverage [email or web address] 10/01/2022, 9:57 AM

## 2022-10-02 LAB — URINE CULTURE: Culture: 7000 — AB

## 2022-10-04 ENCOUNTER — Telehealth: Payer: Self-pay

## 2022-10-04 NOTE — Transitions of Care (Post Inpatient/ED Visit) (Unsigned)
   10/04/2022  Name: Antonieta Paramo MRN: [id number] DOB: 07/12/1960  Today's TOC FU Call Status: Today's TOC FU Call Status:: Unsuccessul Call (1st Attempt) Unsuccessful Call (1st Attempt) Date: 10/04/22  Attempted to reach the patient regarding the most recent Inpatient/ED visit.  Follow Up Plan: Additional outreach attempts will be made to reach the patient to complete the Transitions of Care (Post Inpatient/ED visit) call.   Signature   Woodfin Ganja LPN [facility] Nurse Health Advisor Direct Dial [phone]

## 2022-10-05 NOTE — Transitions of Care (Post Inpatient/ED Visit) (Signed)
   10/05/2022  Name: Paula Parker MRN: [id number] DOB: [birth year]  Today's TOC FU Call Status: Today's TOC FU Call Status:: Successful TOC FU Call Competed Unsuccessful Call (1st Attempt) Date: 10/04/22 [facility] FU Call Complete Date: 10/05/22  Transition Care Management Follow-up Telephone Call Date of Discharge: 10/01/22 Discharge Facility: [facility]) Type of Discharge: Inpatient Admission Primary Inpatient Discharge Diagnosis:: Calculus of ureterovesical junction How have you been since you were released from the hospital?: Better Any questions or concerns?: No  Items Reviewed: Did you receive and understand the discharge instructions provided?: Yes Medications obtained,verified, and reconciled?: Yes (Medications Reviewed) Any new allergies since your discharge?: No Dietary orders reviewed?: Yes Do you have support at home?: No  Medications Reviewed Today: Medications Reviewed Today     Reviewed by Merleen Nicely, LPN (Licensed Practical Nurse) on 10/05/22 at 1028  Med List Status: <None>   Medication Order Taking? Sig Documenting Provider Last Dose Status Informant  cephALEXin (KEFLEX) 500 MG capsule [id number] Yes Take 1 capsule (500 mg total) by mouth 2 (two) times daily. Lorretta Harp, MD Taking Active   Cholecalciferol 50 MCG [phone] UT) TABS [id number] Yes Take 1 tablet (2,000 Units total) by mouth daily. Etta Grandchild, MD Taking Active Self           Med Note Louanna Raw   Fri Oct 01, 2022  6:18 AM)    esomeprazole (NEXIUM) 40 MG capsule [id number] Yes Take 1 capsule (40 mg total) by mouth daily at 12 noon. Etta Grandchild, MD Taking Active   olmesartan [facility]) 40 MG tablet [id number] Yes Take 1 tablet (40 mg total) by mouth daily. Etta Grandchild, MD Taking Active   ondansetron (ZOFRAN-ODT) 4 MG disintegrating tablet [id number] Yes Take 1 tablet (4 mg total) by mouth every 8 (eight) hours as needed for nausea or vomiting. Lorretta Harp, MD  Taking Active             Home Care and Equipment/Supplies: Were Home Health Services Ordered?: No Any new equipment or medical supplies ordered?: No  Functional Questionnaire: Do you need assistance with bathing/showering or dressing?: No Do you need assistance with meal preparation?: No Do you need assistance with eating?: No Do you have difficulty maintaining continence: No Do you need assistance with getting out of bed/getting out of a chair/moving?: No Do you have difficulty managing or taking your medications?: No  Follow up appointments reviewed: PCP Follow-up appointment confirmed?: Yes Date of PCP follow-up appointment?: 10/13/22 Follow-up Provider: Dr Yetta Barre [facility] Follow-up appointment confirmed?: No Do you need transportation to your follow-up appointment?: No Do you understand care options if your condition(s) worsen?: Yes-patient verbalized understanding    SIGNATURE  Woodfin Ganja LPN [facility] Nurse Health Advisor Direct Dial [phone]

## 2022-10-13 ENCOUNTER — Other Ambulatory Visit (HOSPITAL_COMMUNITY): Payer: Self-pay

## 2022-10-13 ENCOUNTER — Encounter: Payer: Self-pay | Admitting: Internal Medicine

## 2022-10-13 ENCOUNTER — Ambulatory Visit (INDEPENDENT_AMBULATORY_CARE_PROVIDER_SITE_OTHER): Payer: Commercial Managed Care - PPO | Admitting: Internal Medicine

## 2022-10-13 VITALS — BP 120/78 | HR 73 | Temp 98.1°F | Ht 61.0 in | Wt 175.0 lb

## 2022-10-13 DIAGNOSIS — E785 Hyperlipidemia, unspecified: Secondary | ICD-10-CM | POA: Diagnosis not present

## 2022-10-13 DIAGNOSIS — I10 Essential (primary) hypertension: Secondary | ICD-10-CM

## 2022-10-13 DIAGNOSIS — J410 Simple chronic bronchitis: Secondary | ICD-10-CM

## 2022-10-13 DIAGNOSIS — R7303 Prediabetes: Secondary | ICD-10-CM

## 2022-10-13 DIAGNOSIS — N1831 Chronic kidney disease, stage 3a: Secondary | ICD-10-CM | POA: Diagnosis not present

## 2022-10-13 LAB — BASIC METABOLIC PANEL
BUN: 17 mg/dL (ref 6–23)
CO2: 29 mEq/L (ref 19–32)
Calcium: 9.8 mg/dL (ref 8.4–10.5)
Chloride: 105 mEq/L (ref 96–112)
Creatinine, Ser: 0.91 mg/dL (ref 0.40–1.20)
GFR: 68.3 mL/min (ref >60.00)
Glucose, Bld: 102 mg/dL — ABNORMAL HIGH (ref 70–99)
Potassium: 4.4 mEq/L (ref 3.5–5.1)
Sodium: 141 mEq/L (ref 135–145)

## 2022-10-13 LAB — HEMOGLOBIN A1C: Hgb A1c MFr Bld: 5.9 % (ref 4.6–6.5)

## 2022-10-13 LAB — LIPID PANEL
Cholesterol: 165 mg/dL (ref 0–200)
HDL: 59.5 mg/dL (ref >39.00)
LDL Cholesterol: 92 mg/dL (ref 0–99)
NonHDL: 105.21
Total CHOL/HDL Ratio: 3
Triglycerides: 66 mg/dL (ref 0.0–149.0)
VLDL: 13.2 mg/dL (ref 0.0–40.0)

## 2022-10-13 MED ORDER — SPIRIVA RESPIMAT 2.5 MCG/ACT IN AERS
2.0000 | INHALATION_SPRAY | Freq: Every day | RESPIRATORY_TRACT | 1 refills | Status: AC
Start: 2022-10-13 — End: ?
  Filled 2022-10-13 – 2023-08-04 (×2): qty 4, 30d supply, fill #0

## 2022-10-13 NOTE — Progress Notes (Signed)
Subjective:  Patient ID: Paula Parker, female    DOB: 10-18-1960  Age: 62 y.o. MRN: [id number]  CC: Hypertension, Hyperlipidemia, and Cough   HPI Paula Parker presents for f/up ----  Discussed the use of AI scribe software for clinical note transcription with the patient, who gave verbal consent to proceed.  History of Present Illness   The patient, recently hospitalized for a kidney stone, presents with improved energy levels following treatment for a urinary tract infection. Paula Parker denies current abdominal or flank pain, hematuria, fever, chills, nausea, or vomiting. However, Paula Parker reports diarrhea as a side effect of the antibiotic therapy.  The patient, a former smoker, reports dyspnea on exertion, which has been present since a recent COVID-19 infection. Paula Parker also reports a persistent cough. Paula Parker denies any treatment for chronic bronchitis or COPD and does not report any wheezing. Paula Parker has previously been prescribed an inhaler with a powder formulation, which Paula Parker found difficult to use.  The patient occasionally produces clear phlegm, which Paula Parker attributes to possible allergies. Paula Parker denies any cardiac symptoms and reports that previous EKG and CT scan of the lungs did not reveal any cardiac abnormalities. Paula Parker has declined vaccinations for flu, pneumonia, and shingles.       Outpatient Medications Prior to Visit  Medication Sig Dispense Refill   cephALEXin (KEFLEX) 500 MG capsule Take 1 capsule (500 mg total) by mouth 2 (two) times daily. 10 capsule 0   Cholecalciferol 50 MCG (2000 UT) TABS Take 1 tablet (2,000 Units total) by mouth daily. 90 tablet 1   esomeprazole (NEXIUM) 40 MG capsule Take 1 capsule (40 mg total) by mouth daily at 12 noon. 90 capsule 0   olmesartan (BENICAR) 40 MG tablet Take 1 tablet (40 mg total) by mouth daily. 90 tablet 0   ondansetron (ZOFRAN-ODT) 4 MG disintegrating tablet Take 1 tablet (4 mg total) by mouth every 8 (eight) hours as needed for nausea or  vomiting. 20 tablet 0   No facility-administered medications prior to visit.    ROS Review of Systems  Constitutional: Negative.  Negative for appetite change, fatigue and unexpected weight change.  HENT: Negative.    Eyes: Negative.   Respiratory:  Positive for cough and shortness of breath. Negative for chest tightness and wheezing.   Cardiovascular:  Negative for chest pain, palpitations and leg swelling.  Gastrointestinal:  Negative for abdominal pain, constipation, diarrhea, nausea and vomiting.  Endocrine: Negative.   Genitourinary: Negative.  Negative for difficulty urinating and dysuria.  Musculoskeletal: Negative.   Skin: Negative.  Negative for color change.  Neurological:  Negative for dizziness, weakness, light-headedness and headaches.  Hematological:  Negative for adenopathy. Does not bruise/bleed easily.  Psychiatric/Behavioral: Negative.      Objective:  BP 120/78 (BP Location: Right Arm, Patient Position: Sitting, Cuff Size: Large)   Pulse 73   Temp 98.1 F (36.7 C) (Oral)   Ht 5\' 1"  (1.549 m)   Wt 175 lb (79.4 kg)   SpO2 91%   BMI 33.07 kg/m   BP Readings from Last 3 Encounters:  10/13/22 120/78  10/01/22 134/71  09/27/22 120/78    Wt Readings from Last 3 Encounters:  10/13/22 175 lb (79.4 kg)  10/01/22 179 lb (81.2 kg)  09/27/22 179 lb (81.2 kg)    Physical Exam Vitals reviewed.  Constitutional:      Appearance: Paula Parker is not ill-appearing.  HENT:     Nose: Nose normal.     Mouth/Throat:  Mouth: Mucous membranes are moist.  Eyes:     General: No scleral icterus.    Conjunctiva/sclera: Conjunctivae normal.  Cardiovascular:     Rate and Rhythm: Normal rate and regular rhythm.     Heart sounds: No murmur heard.    No friction rub. No gallop.  Pulmonary:     Effort: Pulmonary effort is normal.     Breath sounds: No stridor. No wheezing, rhonchi or rales.  Abdominal:     General: Abdomen is flat.     Palpations: There is no mass.      Tenderness: There is no abdominal tenderness. There is no guarding.     Hernia: No hernia is present.  Musculoskeletal:        General: Normal range of motion.     Cervical back: Neck supple.     Right lower leg: No edema.     Left lower leg: No edema.  Lymphadenopathy:     Cervical: No cervical adenopathy.  Skin:    General: Skin is warm and dry.  Neurological:     General: No focal deficit present.     Mental Status: Paula Parker is alert. Mental status is at baseline.  Psychiatric:        Mood and Affect: Mood normal.        Behavior: Behavior normal.     Lab Results  Component Value Date   WBC 7.5 09/30/2022   HGB 13.5 09/30/2022   HCT 42.7 09/30/2022   PLT 263 09/30/2022   GLUCOSE 102 (H) 10/13/2022   CHOL 165 10/13/2022   TRIG 66.0 10/13/2022   HDL 59.50 10/13/2022   LDLCALC 92 10/13/2022   ALT 19 09/30/2022   AST 24 09/30/2022   NA 141 10/13/2022   K 4.4 10/13/2022   CL 105 10/13/2022   CREATININE 0.91 10/13/2022   BUN 17 10/13/2022   CO2 29 10/13/2022   TSH 2.78 05/27/2021   INR 1.0 05/27/2021   HGBA1C 5.9 10/13/2022    CT Renal Stone Study  Result Date: 10/01/2022 CLINICAL DATA:  Right flank pain EXAM: CT ABDOMEN AND PELVIS WITHOUT CONTRAST TECHNIQUE: Multidetector CT imaging of the abdomen and pelvis was performed following the standard protocol without IV contrast. RADIATION DOSE REDUCTION: This exam was performed according to the departmental dose-optimization program which includes automated exposure control, adjustment of the mA and/or kV according to patient size and/or use of iterative reconstruction technique. COMPARISON:  None Available. FINDINGS: Lower chest: No acute abnormality. Hepatobiliary: No focal liver abnormality is seen. Status post cholecystectomy. No biliary dilatation. Pancreas: Unremarkable Spleen: Un remark Adrenals/Urinary Tract: The adrenal glands are unremarkable. The kidneys are normal in size and position. Mild right hydronephrosis and  hydroureter to the level of the right ureterovesicular junction where an obstructing 2 mm calculus is seen. No additional nephro or urolithiasis identified. No hydronephrosis on the left. No perinephric fluid collections. The bladder is decompressed and is unremarkable. Stomach/Bowel: Moderate descending and sigmoid colonic diverticulosis. Stomach, small bowel, and large bowel are otherwise unremarkable. No evidence of obstruction or focal inflammation. Appendix normal. No free intraperitoneal gas or fluid. Vascular/Lymphatic: No significant vascular findings are present. No enlarged abdominal or pelvic lymph nodes. Reproductive: Uterus and bilateral adnexa are unremarkable. Other: No abdominal wall hernia. Musculoskeletal: No acute bone abnormality. No lytic or blastic bone lesion. IMPRESSION: 1. Obstructing 2 mm calculus at the right ureterovesicular junction resulting in mild right hydronephrosis. 2. Moderate distal colonic diverticulosis without superimposed acute inflammatory change. Electronically Signed  By: Helyn Numbers M.D.   On: 10/01/2022 00:27    Assessment & Plan:   Primary hypertension- Paula Parker blood pressure is adequately well-controlled. -     Basic metabolic panel; Future  Prediabetes -     Hemoglobin A1c; Future -     Basic metabolic panel; Future  Stage 3a chronic kidney disease (HCC)- Paula Parker renal function has improved. -     Basic metabolic panel; Future  Dyslipidemia, goal LDL below 100- Paula Parker ASCVD risk score is 3.6%.  Statin is not indicated. -     Lipid panel; Future -     Lipoprotein A (LPA); Future  Simple chronic bronchitis (HCC)- Will treat with a LAMA. -     Spiriva Respimat; Inhale 2 puffs into the lungs daily.  Dispense: 12 g; Refill: 1     Follow-up: Return in about 3 months (around 01/13/2023).  Sanda Linger, MD

## 2022-10-13 NOTE — Patient Instructions (Signed)
Chronic Kidney Disease, Adult Chronic kidney disease (CKD) occurs when the kidneys are slowly and permanently damaged over a long period of time. The kidneys are a pair of organs that do many important jobs in the body, including: Removing waste and extra fluid from the blood to make urine. Making hormones that maintain the amount of fluid in tissues and blood vessels. Maintaining the right amount of fluids and chemicals in the body. A small amount of kidney damage may not cause problems, but a large amount of damage may make it hard or impossible for the kidneys to work right. Steps must be taken to slow kidney damage or to stop it from getting worse. If steps are not taken, the kidneys may stop working permanently (end-stage renal disease, or ESRD). Most of the time, CKD does not go away, but it can often be controlled. People who have CKD are usually able to live full lives. What are the causes? The most common causes of this condition are diabetes and high blood pressure (hypertension). Other causes include: Cardiovascular diseases. These affect the heart and blood vessels. Kidney diseases. These include: Glomerulonephritis, or inflammation of the tiny filters in the kidneys. Interstitial nephritis. This is swelling of the small tubes of the kidneys and of the surrounding structures. Polycystic kidney disease, in which clusters of fluid-filled sacs form within the kidneys. Renal vascular disease. This includes disorders that affect the arteries and veins of the kidneys. Diseases that affect the body's defense system (immune system). A problem with urine flow. This may be caused by: Kidney stones. Cancer. An enlarged prostate, in males. A kidney infection or urinary tract infection (UTI) that keeps coming back. Vasculitis. This is swelling or inflammation of the blood vessels. What increases the risk? Your chances of having kidney disease increase with age. The following factors may make  you more likely to develop this condition: A family history of kidney disease or kidney failure. Kidney failure means the kidneys can no longer work right. Certain genetic diseases. Taking medicines often that are damaging to the kidneys. Being around or being in contact with toxic substances. Obesity. A history of tobacco use. What are the signs or symptoms? Symptoms of this condition include: Feeling very tired (lethargic) and having less energy. Swelling, or edema, of the face, legs, ankles, or feet. Nausea or vomiting, or loss of appetite. Confusion or trouble concentrating. Muscle twitches and cramps, especially in the legs. Dry, itchy skin. A metallic taste in the mouth. Producing less urine, or producing more urine (especially at night). Shortness of breath. Trouble sleeping. CKD may also result in not having enough red blood cells or hemoglobin in the blood (anemia) or having weak bones (bone disease). Symptoms develop slowly and may not be obvious until the kidney damage becomes severe. It is possible to have kidney disease for years without having symptoms. How is this diagnosed? This condition may be diagnosed based on: Blood tests. Urine tests. Imaging tests, such as an ultrasound or a CT scan. A kidney biopsy. This involves removing a sample of kidney tissue to be looked at under a microscope. Results from these tests will help to determine how serious the CKD is. How is this treated? There is no cure for most cases of this condition, but treatment usually relieves symptoms and prevents or slows the worsening of the disease. Treatment may include: Diet changes, which may require you to avoid alcohol and foods that are high in salt, potassium, phosphorous, and protein. Medicines. These may:   Lower blood pressure. Control blood sugar (glucose). Relieve anemia. Relieve swelling. Protect your bones. Improve the balance of salts and minerals in your blood  (electrolytes). Dialysis, which is a type of treatment that removes toxic waste from the body. It may be needed if you have kidney failure. Managing any other conditions that are causing your CKD or making it worse. Follow these instructions at home: Medicines Take over-the-counter and prescription medicines only as told by your health care provider. The amount of some medicines that you take may need to be changed. Do not take any new medicines unless approved by your health care provider. Many medicines can make kidney damage worse. Do not take any vitamin and mineral supplements unless approved by your health care provider. Many nutritional supplements can make kidney damage worse. Lifestyle  Do not use any products that contain nicotine or tobacco, such as cigarettes, e-cigarettes, and chewing tobacco. If you need help quitting, ask your health care provider. If you drink alcohol: Limit how much you use to: 0-1 drink a day for women who are not pregnant. 0-2 drinks a day for men. Know how much alcohol is in your drink. In the [place] one drink equals one 12 oz bottle of beer (355 mL), one 5 oz glass of wine (148 mL), or one 1 oz glass of hard liquor (44 mL). Maintain a healthy weight. If you need help, ask your health care provider. General instructions  Follow instructions from your health care provider about eating or drinking restrictions, including any prescribed diet. Track your blood pressure at home. Report changes in your blood pressure as told. If you are being treated for diabetes, track your blood glucose levels as told. Start or continue an exercise plan. Exercise at least 30 minutes a day, 5 days a week. Keep your immunizations up to date as told. Keep all follow-up visits. This is important. Where to find more information American Association of Kidney Patients: www.aakp.org National Kidney Foundation: www.kidney.org American Kidney Fund: www.akfinc.org Life Options:  www.lifeoptions.org Kidney School: www.kidneyschool.org Contact a health care provider if: Your symptoms get worse. You develop new symptoms. Get help right away if: You develop symptoms of ESRD. These include: Headaches. Numbness in your hands or feet. Easy bruising. Frequent hiccups. Chest pain. Shortness of breath. Lack of menstrual periods, in women. You have a fever. You are producing less urine than usual. You have pain or bleeding when you urinate or when you have a bowel movement. These symptoms may represent a serious problem that is an emergency. Do not wait to see if the symptoms will go away. Get medical help right away. Call your local emergency services (911 in the [place] Do not drive yourself to the hospital. Summary Chronic kidney disease (CKD) occurs when the kidneys become damaged slowly over a long period of time. The most common causes of this condition are diabetes and high blood pressure (hypertension). There is no cure for most cases of CKD, but treatment usually relieves symptoms and prevents or slows the worsening of the disease. Treatment may include a combination of lifestyle changes, medicines, and dialysis. This information is not intended to replace advice given to you by your health care provider. Make sure you discuss any questions you have with your health care provider. Document Revised: 06/27/2019 Document Reviewed: 06/27/2019 [employer] Patient Education  2024 [employer]  

## 2022-10-14 LAB — LIPOPROTEIN A (LPA): Lipoprotein (a): 24 nmol/L (ref <75)

## 2022-10-18 ENCOUNTER — Other Ambulatory Visit: Payer: Self-pay | Admitting: Internal Medicine

## 2022-10-18 ENCOUNTER — Other Ambulatory Visit (HOSPITAL_COMMUNITY): Payer: Self-pay

## 2022-10-18 DIAGNOSIS — K219 Gastro-esophageal reflux disease without esophagitis: Secondary | ICD-10-CM

## 2022-10-18 MED ORDER — ESOMEPRAZOLE MAGNESIUM 40 MG PO CPDR
40.0000 mg | DELAYED_RELEASE_CAPSULE | Freq: Every day | ORAL | 0 refills | Status: DC
Start: 2022-10-18 — End: 2023-01-21
  Filled 2022-10-18: qty 90, 90d supply, fill #0

## 2022-10-21 ENCOUNTER — Other Ambulatory Visit (HOSPITAL_COMMUNITY): Payer: Self-pay

## 2022-11-03 ENCOUNTER — Encounter (INDEPENDENT_AMBULATORY_CARE_PROVIDER_SITE_OTHER): Payer: Self-pay

## 2022-11-17 ENCOUNTER — Ambulatory Visit (INDEPENDENT_AMBULATORY_CARE_PROVIDER_SITE_OTHER): Payer: Commercial Managed Care - PPO

## 2022-11-17 ENCOUNTER — Encounter: Payer: Self-pay | Admitting: Internal Medicine

## 2022-11-17 ENCOUNTER — Ambulatory Visit: Payer: Commercial Managed Care - PPO | Admitting: Internal Medicine

## 2022-11-17 VITALS — BP 148/92 | HR 74 | Temp 97.9°F | Resp 16 | Ht 61.0 in | Wt 172.0 lb

## 2022-11-17 DIAGNOSIS — I10 Essential (primary) hypertension: Secondary | ICD-10-CM

## 2022-11-17 DIAGNOSIS — M79651 Pain in right thigh: Secondary | ICD-10-CM

## 2022-11-17 NOTE — Progress Notes (Signed)
Subjective:  Patient ID: Paula Parker, female    DOB: January 03, 1961  Age: 62 y.o. MRN: [id number]  CC: Hypertension   HPI Paula Parker presents for f/up -----  Discussed the use of AI scribe software for clinical note transcription with the patient, who gave verbal consent to proceed.  History of Present Illness   The patient presents with a chief complaint of right hip and thigh pain that has been ongoing for approximately one and a half to two weeks. The pain is described as being triggered by walking, with no discomfort reported during periods of rest. The patient denies any known injury that could have initiated this pain. She is controlling the pain with motrin and apap.  In terms of their general health, the patient denies experiencing any chest pain, shortness of breath, dizziness, or lightheadedness. They also report no known side effects from their current cholesterol and blood pressure medications. The patient has not been evaluated for heart disease in the past and denies any history of heart symptoms.       Outpatient Medications Prior to Visit  Medication Sig Dispense Refill   cephALEXin (KEFLEX) 500 MG capsule Take 1 capsule (500 mg total) by mouth 2 (two) times daily. 10 capsule 0   Cholecalciferol 50 MCG (2000 UT) TABS Take 1 tablet (2,000 Units total) by mouth daily. 90 tablet 1   esomeprazole (NEXIUM) 40 MG capsule Take 1 capsule (40 mg total) by mouth daily at 12 noon. 90 capsule 0   olmesartan (BENICAR) 40 MG tablet Take 1 tablet (40 mg total) by mouth daily. 90 tablet 0   ondansetron (ZOFRAN-ODT) 4 MG disintegrating tablet Take 1 tablet (4 mg total) by mouth every 8 (eight) hours as needed for nausea or vomiting. 20 tablet 0   Tiotropium Bromide Monohydrate (SPIRIVA RESPIMAT) 2.5 MCG/ACT AERS Inhale 2 puffs into the lungs daily. 12 g 1   No facility-administered medications prior to visit.    ROS Review of Systems  Constitutional:  Negative for chills,  diaphoresis, fatigue and fever.  HENT: Negative.    Eyes: Negative.   Respiratory:  Negative for cough, chest tightness, shortness of breath and wheezing.   Cardiovascular:  Negative for chest pain, palpitations and leg swelling.  Gastrointestinal:  Negative for abdominal pain, diarrhea, nausea and vomiting.  Endocrine: Negative.   Genitourinary: Negative.  Negative for difficulty urinating.  Musculoskeletal:  Positive for arthralgias. Negative for myalgias and neck pain.  Skin: Negative.   Neurological: Negative.  Negative for dizziness and weakness.  Hematological:  Negative for adenopathy. Does not bruise/bleed easily.  Psychiatric/Behavioral: Negative.      Objective:  BP (!) 148/92 (BP Location: Left Arm, Patient Position: Sitting, Cuff Size: Large)   Pulse 74   Temp 97.9 F (36.6 C) (Oral)   Resp 16   Ht 5\' 1"  (1.549 m)   Wt 172 lb (78 kg)   SpO2 96%   BMI 32.50 kg/m   BP Readings from Last 3 Encounters:  11/17/22 (!) 148/92  10/13/22 120/78  10/01/22 134/71    Wt Readings from Last 3 Encounters:  11/17/22 172 lb (78 kg)  10/13/22 175 lb (79.4 kg)  10/01/22 179 lb (81.2 kg)    Physical Exam Vitals reviewed.  HENT:     Nose: Nose normal.     Mouth/Throat:     Mouth: Mucous membranes are moist.  Eyes:     General: No scleral icterus.    Conjunctiva/sclera: Conjunctivae normal.  Cardiovascular:  Rate and Rhythm: Normal rate and regular rhythm.     Heart sounds: No murmur heard. Pulmonary:     Effort: Pulmonary effort is normal.     Breath sounds: No stridor. No wheezing, rhonchi or rales.  Abdominal:     General: Abdomen is flat.     Palpations: There is no mass.     Tenderness: There is no abdominal tenderness. There is no guarding.     Hernia: No hernia is present.  Musculoskeletal:        General: No swelling or tenderness. Normal range of motion.     Cervical back: Neck supple.     Right lower leg: No edema.     Left lower leg: No edema.   Lymphadenopathy:     Cervical: No cervical adenopathy.  Skin:    General: Skin is warm and dry.  Neurological:     General: No focal deficit present.     Mental Status: She is alert. Mental status is at baseline.  Psychiatric:        Mood and Affect: Mood normal.        Behavior: Behavior normal.     Lab Results  Component Value Date   WBC 7.5 09/30/2022   HGB 13.5 09/30/2022   HCT 42.7 09/30/2022   PLT 263 09/30/2022   GLUCOSE 102 (H) 10/13/2022   CHOL 165 10/13/2022   TRIG 66.0 10/13/2022   HDL 59.50 10/13/2022   LDLCALC 92 10/13/2022   ALT 19 09/30/2022   AST 24 09/30/2022   NA 141 10/13/2022   K 4.4 10/13/2022   CL 105 10/13/2022   CREATININE 0.91 10/13/2022   BUN 17 10/13/2022   CO2 29 10/13/2022   TSH 2.78 05/27/2021   INR 1.0 05/27/2021   HGBA1C 5.9 10/13/2022    CT Renal Stone Study  Result Date: 10/01/2022 CLINICAL DATA:  Right flank pain EXAM: CT ABDOMEN AND PELVIS WITHOUT CONTRAST TECHNIQUE: Multidetector CT imaging of the abdomen and pelvis was performed following the standard protocol without IV contrast. RADIATION DOSE REDUCTION: This exam was performed according to the departmental dose-optimization program which includes automated exposure control, adjustment of the mA and/or kV according to patient size and/or use of iterative reconstruction technique. COMPARISON:  None Available. FINDINGS: Lower chest: No acute abnormality. Hepatobiliary: No focal liver abnormality is seen. Status post cholecystectomy. No biliary dilatation. Pancreas: Unremarkable Spleen: Un remark Adrenals/Urinary Tract: The adrenal glands are unremarkable. The kidneys are normal in size and position. Mild right hydronephrosis and hydroureter to the level of the right ureterovesicular junction where an obstructing 2 mm calculus is seen. No additional nephro or urolithiasis identified. No hydronephrosis on the left. No perinephric fluid collections. The bladder is decompressed and is  unremarkable. Stomach/Bowel: Moderate descending and sigmoid colonic diverticulosis. Stomach, small bowel, and large bowel are otherwise unremarkable. No evidence of obstruction or focal inflammation. Appendix normal. No free intraperitoneal gas or fluid. Vascular/Lymphatic: No significant vascular findings are present. No enlarged abdominal or pelvic lymph nodes. Reproductive: Uterus and bilateral adnexa are unremarkable. Other: No abdominal wall hernia. Musculoskeletal: No acute bone abnormality. No lytic or blastic bone lesion. IMPRESSION: 1. Obstructing 2 mm calculus at the right ureterovesicular junction resulting in mild right hydronephrosis. 2. Moderate distal colonic diverticulosis without superimposed acute inflammatory change. Electronically Signed   By: Helyn Numbers M.D.   On: 10/01/2022 00:27   DG FEMUR, MIN 2 VIEWS RIGHT  Result Date: 11/17/2022 CLINICAL DATA:  NKI. Right femur pain for 1  1/2 - 2 weeks. No surgery. EXAM: RIGHT FEMUR 2 VIEWS COMPARISON:  None available FINDINGS: No fracture, dislocation, or soft tissue abnormality. IMPRESSION: No radiographic abnormality of the right femur. Electronically Signed   By: Acquanetta Belling M.D.   On: 11/17/2022 09:19   CT Renal Stone Study  Result Date: 10/01/2022 CLINICAL DATA:  Right flank pain EXAM: CT ABDOMEN AND PELVIS WITHOUT CONTRAST TECHNIQUE: Multidetector CT imaging of the abdomen and pelvis was performed following the standard protocol without IV contrast. RADIATION DOSE REDUCTION: This exam was performed according to the departmental dose-optimization program which includes automated exposure control, adjustment of the mA and/or kV according to patient size and/or use of iterative reconstruction technique. COMPARISON:  None Available. FINDINGS: Lower chest: No acute abnormality. Hepatobiliary: No focal liver abnormality is seen. Status post cholecystectomy. No biliary dilatation. Pancreas: Unremarkable Spleen: Un remark Adrenals/Urinary  Tract: The adrenal glands are unremarkable. The kidneys are normal in size and position. Mild right hydronephrosis and hydroureter to the level of the right ureterovesicular junction where an obstructing 2 mm calculus is seen. No additional nephro or urolithiasis identified. No hydronephrosis on the left. No perinephric fluid collections. The bladder is decompressed and is unremarkable. Stomach/Bowel: Moderate descending and sigmoid colonic diverticulosis. Stomach, small bowel, and large bowel are otherwise unremarkable. No evidence of obstruction or focal inflammation. Appendix normal. No free intraperitoneal gas or fluid. Vascular/Lymphatic: No significant vascular findings are present. No enlarged abdominal or pelvic lymph nodes. Reproductive: Uterus and bilateral adnexa are unremarkable. Other: No abdominal wall hernia. Musculoskeletal: No acute bone abnormality. No lytic or blastic bone lesion. IMPRESSION: 1. Obstructing 2 mm calculus at the right ureterovesicular junction resulting in mild right hydronephrosis. 2. Moderate distal colonic diverticulosis without superimposed acute inflammatory change. Electronically Signed   By: Helyn Numbers M.D.   On: 10/01/2022 00:27   DG Finger Little Left  Result Date: 09/29/2022 CLINICAL DATA:  Left small finger pain after injury EXAM: LEFT FINGER(S) - 2+ VIEW COMPARISON:  None Available. FINDINGS: There is no evidence of fracture or dislocation. Mild joint space narrowing at the DIP joint. Soft tissues are unremarkable. IMPRESSION: Negative. Electronically Signed   By: Duanne Guess D.O.   On: 09/29/2022 09:16     Assessment & Plan:    Acute pain of right thigh- Plain films are normal. -     DG FEMUR, MIN 2 VIEWS RIGHT; Future  Primary hypertension- Her BP is not goal but she is not willing to add another agent.     Follow-up: No follow-ups on file.  Sanda Linger, MD

## 2022-11-24 ENCOUNTER — Other Ambulatory Visit (HOSPITAL_COMMUNITY): Payer: Self-pay

## 2022-11-24 ENCOUNTER — Other Ambulatory Visit: Payer: Self-pay | Admitting: Internal Medicine

## 2022-11-24 ENCOUNTER — Other Ambulatory Visit: Payer: Self-pay

## 2022-11-24 DIAGNOSIS — I10 Essential (primary) hypertension: Secondary | ICD-10-CM

## 2022-11-24 MED ORDER — OLMESARTAN MEDOXOMIL 40 MG PO TABS
40.0000 mg | ORAL_TABLET | Freq: Every day | ORAL | 0 refills | Status: DC
Start: 2022-11-24 — End: 2023-02-15
  Filled 2022-11-24 (×2): qty 90, 90d supply, fill #0

## 2022-12-13 ENCOUNTER — Telehealth (INDEPENDENT_AMBULATORY_CARE_PROVIDER_SITE_OTHER): Payer: Commercial Managed Care - PPO | Admitting: Family Medicine

## 2022-12-13 ENCOUNTER — Other Ambulatory Visit: Payer: Self-pay

## 2022-12-13 ENCOUNTER — Encounter: Payer: Self-pay | Admitting: Family Medicine

## 2022-12-13 VITALS — Ht 61.0 in | Wt 172.0 lb

## 2022-12-13 DIAGNOSIS — U071 COVID-19: Secondary | ICD-10-CM | POA: Diagnosis not present

## 2022-12-13 MED ORDER — PROMETHAZINE-DM 6.25-15 MG/5ML PO SYRP
5.0000 mL | ORAL_SOLUTION | Freq: Four times a day (QID) | ORAL | 0 refills | Status: DC | PRN
  Filled 2022-12-13: qty 118, 6d supply, fill #0

## 2022-12-13 NOTE — Progress Notes (Signed)
Virtual Visit via Video Note  I connected with Paula Parker on 12/13/22 at  1:00 PM EDT by a video enabled telemedicine application and verified that I am speaking with the correct person using two identifiers.  Patient Location: Home Provider Location: Office/Clinic  I discussed the limitations, risks, security, and privacy concerns of performing an evaluation and management service by video and the availability of in person appointments. I also discussed with the patient that there may be a patient responsible charge related to this service. The patient expressed understanding and agreed to proceed.  Subjective: PCP: Etta Grandchild, MD  Chief Complaint  Patient presents with   Covid Positive    First symptom started yesterday afternoon (sneezing, throat scratchy, tired, cough, no fever, slight HA) Tested this morning - positive. (Grandson pos Friday)    Patient complains of cough, headache, sneezing, scratchy throat, chest tightness, and fatigue. She denies fever and shortness of breath. Onset of symptoms was 1 day ago, unchanged since that time. She is drinking plenty of fluids. Evaluation to date: at home COVID test positive this morning. Treatment to date: cough suppressants and Tylenol . She has a history of asthma. She does not smoke.    ROS: Per HPI  Current Outpatient Medications:    Cholecalciferol 50 MCG (2000 UT) TABS, Take 1 tablet (2,000 Units total) by mouth daily., Disp: 90 tablet, Rfl: 1   esomeprazole (NEXIUM) 40 MG capsule, Take 1 capsule (40 mg total) by mouth daily at 12 noon., Disp: 90 capsule, Rfl: 0   olmesartan (BENICAR) 40 MG tablet, Take 1 tablet (40 mg total) by mouth daily., Disp: 90 tablet, Rfl: 0   ondansetron (ZOFRAN-ODT) 4 MG disintegrating tablet, Take 1 tablet (4 mg total) by mouth every 8 (eight) hours as needed for nausea or vomiting. (Patient not taking: Reported on 12/13/2022), Disp: 20 tablet, Rfl: 0   Tiotropium Bromide Monohydrate (SPIRIVA  RESPIMAT) 2.5 MCG/ACT AERS, Inhale 2 puffs into the lungs daily. (Patient not taking: Reported on 12/13/2022), Disp: 12 g, Rfl: 1  Observations/Objective: Today's Vitals   12/13/22 1305  Weight: 172 lb (78 kg)  Height: 5\' 1"  (1.549 m)   Physical Exam Constitutional:      General: She is not in acute distress.    Appearance: Normal appearance. She is not ill-appearing or toxic-appearing.  Eyes:     General: No scleral icterus.       Right eye: No discharge.        Left eye: No discharge.     Conjunctiva/sclera: Conjunctivae normal.  Pulmonary:     Effort: Pulmonary effort is normal. No respiratory distress.  Neurological:     Mental Status: She is alert and oriented to person, place, and time.  Psychiatric:        Mood and Affect: Mood normal.        Behavior: Behavior normal.        Thought Content: Thought content normal.        Judgment: Judgment normal.     Assessment and Plan: 1. COVID-19 Education provided on COVID-19.  Discussed typical duration and progression of viral illnesses.  Encouraged symptom management.  Discussed recommended quarantine of 5 days with a mask afterwards up to day 10.  Patient reports her workplace will not allow her to come back after 5 days, but they want her to have a COVID test after 7 days and if it is negative she may return to work 2 days after that.  Follow Up Instructions: Return if symptoms worsen or fail to improve.   I discussed the assessment and treatment plan with the patient. The patient was provided an opportunity to ask questions, and all were answered. The patient agreed with the plan and demonstrated an understanding of the instructions.   The patient was advised to call back or seek an in-person evaluation if the symptoms worsen or if the condition fails to improve as anticipated.  The above assessment and management plan was discussed with the patient. The patient verbalized understanding of and has agreed to the management  plan.   Gwenlyn Fudge, FNP

## 2022-12-20 ENCOUNTER — Telehealth: Payer: Self-pay | Admitting: Internal Medicine

## 2022-12-20 NOTE — Telephone Encounter (Signed)
We have received FMLA forms in the fax and they have been placed in the providers box.   Please fax to: [phone]

## 2022-12-22 NOTE — Telephone Encounter (Signed)
Forms have been completed and given to Pcp to review and sign

## 2022-12-24 DIAGNOSIS — Z0279 Encounter for issue of other medical certificate: Secondary | ICD-10-CM

## 2022-12-24 NOTE — Telephone Encounter (Signed)
Form has been signed and faxed back

## 2023-01-21 ENCOUNTER — Other Ambulatory Visit (HOSPITAL_COMMUNITY): Payer: Self-pay

## 2023-01-21 ENCOUNTER — Other Ambulatory Visit: Payer: Self-pay | Admitting: Internal Medicine

## 2023-01-21 DIAGNOSIS — K219 Gastro-esophageal reflux disease without esophagitis: Secondary | ICD-10-CM

## 2023-01-21 MED ORDER — ESOMEPRAZOLE MAGNESIUM 40 MG PO CPDR
40.0000 mg | DELAYED_RELEASE_CAPSULE | Freq: Every day | ORAL | 0 refills | Status: DC
Start: 2023-01-21 — End: 2023-04-27
  Filled 2023-01-21: qty 90, 90d supply, fill #0

## 2023-02-15 ENCOUNTER — Other Ambulatory Visit: Payer: Self-pay | Admitting: Internal Medicine

## 2023-02-15 ENCOUNTER — Other Ambulatory Visit (HOSPITAL_COMMUNITY): Payer: Self-pay

## 2023-02-15 DIAGNOSIS — I10 Essential (primary) hypertension: Secondary | ICD-10-CM

## 2023-02-15 MED ORDER — OLMESARTAN MEDOXOMIL 40 MG PO TABS
40.0000 mg | ORAL_TABLET | Freq: Every day | ORAL | 0 refills | Status: DC
Start: 2023-02-15 — End: 2023-05-19
  Filled 2023-02-15: qty 90, 90d supply, fill #0

## 2023-03-23 ENCOUNTER — Encounter: Payer: Self-pay | Admitting: Internal Medicine

## 2023-03-23 ENCOUNTER — Ambulatory Visit: Payer: Commercial Managed Care - PPO | Admitting: Internal Medicine

## 2023-03-23 VITALS — BP 138/78 | HR 67 | Temp 98.1°F | Resp 16 | Ht 61.0 in | Wt 171.4 lb

## 2023-03-23 DIAGNOSIS — I10 Essential (primary) hypertension: Secondary | ICD-10-CM | POA: Diagnosis not present

## 2023-03-23 DIAGNOSIS — R109 Unspecified abdominal pain: Secondary | ICD-10-CM | POA: Diagnosis not present

## 2023-03-23 DIAGNOSIS — G8929 Other chronic pain: Secondary | ICD-10-CM | POA: Diagnosis not present

## 2023-03-23 DIAGNOSIS — N182 Chronic kidney disease, stage 2 (mild): Secondary | ICD-10-CM | POA: Diagnosis not present

## 2023-03-23 LAB — CBC WITH DIFFERENTIAL/PLATELET
Basophils Absolute: 0 10*3/uL (ref 0.0–0.1)
Basophils Relative: 0.9 % (ref 0.0–3.0)
Eosinophils Absolute: 0.1 10*3/uL (ref 0.0–0.7)
Eosinophils Relative: 2.8 % (ref 0.0–5.0)
HCT: 41.2 % (ref 36.0–46.0)
Hemoglobin: 13.6 g/dL (ref 12.0–15.0)
Lymphocytes Relative: 33.3 % (ref 12.0–46.0)
Lymphs Abs: 1.4 10*3/uL (ref 0.7–4.0)
MCHC: 33 g/dL (ref 30.0–36.0)
MCV: 98.7 fL (ref 78.0–100.0)
Monocytes Absolute: 0.4 10*3/uL (ref 0.1–1.0)
Monocytes Relative: 8.6 % (ref 3.0–12.0)
Neutro Abs: 2.3 10*3/uL (ref 1.4–7.7)
Neutrophils Relative %: 54.4 % (ref 43.0–77.0)
Platelets: 258 10*3/uL (ref 150.0–400.0)
RBC: 4.17 Mil/uL (ref 3.87–5.11)
RDW: 13.2 % (ref 11.5–15.5)
WBC: 4.3 10*3/uL (ref 4.0–10.5)

## 2023-03-23 LAB — LIPASE: Lipase: 20 U/L (ref 11.0–59.0)

## 2023-03-23 LAB — HEPATIC FUNCTION PANEL
ALT: 17 U/L (ref 0–35)
AST: 20 U/L (ref 0–37)
Albumin: 4.1 g/dL (ref 3.5–5.2)
Alkaline Phosphatase: 43 U/L (ref 39–117)
Bilirubin, Direct: 0.1 mg/dL (ref 0.0–0.3)
Total Bilirubin: 0.3 mg/dL (ref 0.2–1.2)
Total Protein: 6.4 g/dL (ref 6.0–8.3)

## 2023-03-23 LAB — URINALYSIS, ROUTINE W REFLEX MICROSCOPIC
Bilirubin Urine: NEGATIVE
Ketones, ur: NEGATIVE
Leukocytes,Ua: NEGATIVE
Nitrite: NEGATIVE
Specific Gravity, Urine: 1.025 (ref 1.000–1.030)
Total Protein, Urine: NEGATIVE
Urine Glucose: NEGATIVE
Urobilinogen, UA: 0.2 (ref 0.0–1.0)
pH: 6 (ref 5.0–8.0)

## 2023-03-23 LAB — BASIC METABOLIC PANEL
BUN: 20 mg/dL (ref 6–23)
CO2: 29 meq/L (ref 19–32)
Calcium: 8.7 mg/dL (ref 8.4–10.5)
Chloride: 110 meq/L (ref 96–112)
Creatinine, Ser: 0.81 mg/dL (ref 0.40–1.20)
GFR: 78.29 mL/min (ref >60.00)
Glucose, Bld: 108 mg/dL — ABNORMAL HIGH (ref 70–99)
Potassium: 4.3 meq/L (ref 3.5–5.1)
Sodium: 144 meq/L (ref 135–145)

## 2023-03-23 NOTE — Patient Instructions (Signed)
Flank Pain, Adult  Flank pain is pain that is located on the side of the body between the upper abdomen and the spine. This area is called the flank. The pain may occur over a short period of time (acute), or it may be long-term or recurring (chronic). It may be mild or severe. Flank pain can be caused by many things, including:  Muscle soreness or injury.  Kidney infection, kidney stones, or kidney disease.  Stress.  A disease of the spine (vertebral disk disease).  A lung infection (pneumonia).  Fluid around the lungs (pulmonary edema).  A skin rash caused by the chickenpox virus (shingles).  Tumors that affect the back of the abdomen.  Gallbladder disease.  Follow these instructions at home:    Drink enough fluid to keep your urine pale yellow.  Rest as told by your health care provider.  Take over-the-counter and prescription medicines only as told by your health care provider.  Keep a journal to track what has caused your flank pain and what has made it feel better.  Keep all follow-up visits. This is important.  Contact a health care provider if:  Your pain is not controlled with medicine.  You have new symptoms.  Your pain gets worse.  Your symptoms last longer than 2-3 days.  You have trouble urinating or you are urinating very frequently.  Get help right away if:  You have trouble breathing or you are short of breath.  Your abdomen hurts or it is swollen or red.  You have nausea or vomiting.  You feel faint, or you faint.  You have blood in your urine.  You have flank pain and a fever.  These symptoms may represent a serious problem that is an emergency. Do not wait to see if the symptoms will go away. Get medical help right away. Call your local emergency services (911 in the [place] Do not drive yourself to the hospital.  Summary  Flank pain is pain that is located on the side of the body between the upper abdomen and the spine.  The pain may occur over a short period of time (acute), or it may be  long-term or recurring (chronic). It may be mild or severe.  Flank pain can be caused by many things.  Contact your health care provider if your symptoms get worse or last longer than 2-3 days.  This information is not intended to replace advice given to you by your health care provider. Make sure you discuss any questions you have with your health care provider.  Document Revised: 06/02/2020 Document Reviewed: 06/02/2020  Elsevier Patient Education  2024 [employer].

## 2023-03-23 NOTE — Progress Notes (Signed)
Subjective:  Patient ID: Paula Parker, female    DOB: 05-16-1960  Age: 62 y.o. MRN: [id number]  CC: Abdominal Pain and Hypertension   HPI Paula Parker presents for f/up ----  Discussed the use of AI scribe software for clinical note transcription with the patient, who gave verbal consent to proceed.  History of Present Illness   The patient, with a history of dry skin, presents with a chief complaint of increased skin dryness due to cold weather. She has been moisturizing her skin, but it remains dry, particularly on her hands. She also reports a mild cough with minimal phlegm production, but denies any associated chest pain, shortness of breath, or dizziness. She is not currently taking any cough medications. She occasionally uses an inhaler (Stiolto/Spiriva), which she reports helps with her symptoms.  The patient has been proactive with her health, having received a flu shot at her workplace. She is a former smoker, but her husband continues to smoke, albeit not heavily at home.  She is currently taking Nexium for heartburn and indigestion and denies any difficulty or pain while swallowing.   The patient also reports intermittent right flank pain. 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 The pain is located in the right flank area. She denies any current abdominal pain or issues.       Outpatient Medications Prior to Visit  Medication Sig Dispense Refill   Cholecalciferol 50 MCG (2000 UT) TABS Take 1 tablet (2,000 Units total) by mouth daily. 90 tablet 1   esomeprazole (NEXIUM) 40 MG capsule Take 1 capsule (40 mg total) by mouth daily at 12 noon. 90 capsule 0   olmesartan (BENICAR) 40 MG tablet Take 1 tablet (40 mg total) by mouth daily. 90 tablet 0   promethazine-dextromethorphan (PROMETHAZINE-DM) 6.25-15 MG/5ML syrup Take 5 mLs by mouth 4 (four) times daily as needed for cough. 118 mL 0   Tiotropium Bromide  Monohydrate (SPIRIVA RESPIMAT) 2.5 MCG/ACT AERS Inhale 2 puffs into the lungs daily. 12 g 1   ondansetron (ZOFRAN-ODT) 4 MG disintegrating tablet Take 1 tablet (4 mg total) by mouth every 8 (eight) hours as needed for nausea or vomiting. 20 tablet 0   No facility-administered medications prior to visit.    ROS Review of Systems  Constitutional:  Negative for appetite change, diaphoresis, fatigue and unexpected weight change.  HENT: Negative.    Eyes: Negative.   Respiratory: Negative.  Negative for cough, chest tightness, shortness of breath and wheezing.   Cardiovascular:  Negative for chest pain, palpitations and leg swelling.  Gastrointestinal:  Negative for abdominal pain, constipation, diarrhea, nausea and vomiting.  Genitourinary:  Positive for flank pain. Negative for decreased urine volume, difficulty urinating, dysuria and hematuria.  Musculoskeletal:  Negative for arthralgias and myalgias.  Skin: Negative.   Neurological: Negative.  Negative for dizziness and weakness.  Hematological:  Negative for adenopathy. Does not bruise/bleed easily.  Psychiatric/Behavioral: Negative.      Objective:  BP 138/78 (BP Location: Left Arm, Patient Position: Sitting, Cuff Size: Normal)   Pulse 67   Temp 98.1 F (36.7 C) (Oral)   Resp 16   Ht 5\' 1"  (1.549 m)   Wt 171 lb 6.4 oz (77.7 kg)   SpO2 97%   BMI 32.39 kg/m   BP Readings from Last 3 Encounters:  03/23/23 138/78  11/17/22 (!) 148/92  10/13/22 120/78    Wt Readings from Last 3 Encounters:  03/23/23  171 lb 6.4 oz (77.7 kg)  12/13/22 172 lb (78 kg)  11/17/22 172 lb (78 kg)    Physical Exam Vitals reviewed.  Constitutional:      Appearance: She is not ill-appearing.  HENT:     Mouth/Throat:     Mouth: Mucous membranes are moist.  Eyes:     General: No scleral icterus.    Conjunctiva/sclera: Conjunctivae normal.  Cardiovascular:     Rate and Rhythm: Normal rate and regular rhythm.     Heart sounds: No murmur  heard.    No friction rub. No gallop.  Pulmonary:     Effort: Pulmonary effort is normal.     Breath sounds: No stridor. No wheezing, rhonchi or rales.  Abdominal:     General: Abdomen is protuberant. Bowel sounds are normal. There is no distension.     Palpations: Abdomen is soft. There is no hepatomegaly, splenomegaly or mass.     Tenderness: There is no abdominal tenderness. There is no right CVA tenderness or left CVA tenderness.     Hernia: No hernia is present.  Musculoskeletal:        General: Normal range of motion.     Cervical back: Neck supple.     Right lower leg: No edema.     Left lower leg: No edema.  Lymphadenopathy:     Cervical: No cervical adenopathy.  Skin:    General: Skin is warm and dry.  Neurological:     General: No focal deficit present.     Mental Status: She is alert. Mental status is at baseline.  Psychiatric:        Mood and Affect: Mood normal.        Behavior: Behavior normal.        Thought Content: Thought content normal.        Judgment: Judgment normal.     Lab Results  Component Value Date   WBC 4.3 03/23/2023   HGB 13.6 03/23/2023   HCT 41.2 03/23/2023   PLT 258.0 03/23/2023   GLUCOSE 108 (H) 03/23/2023   CHOL 165 10/13/2022   TRIG 66.0 10/13/2022   HDL 59.50 10/13/2022   LDLCALC 92 10/13/2022   ALT 17 03/23/2023   AST 20 03/23/2023   NA 144 03/23/2023   K 4.3 03/23/2023   CL 110 03/23/2023   CREATININE 0.81 03/23/2023   BUN 20 03/23/2023   CO2 29 03/23/2023   TSH 2.78 05/27/2021   INR 1.0 05/27/2021   HGBA1C 5.9 10/13/2022    CT Renal Stone Study Result Date: 10/01/2022 CLINICAL DATA:  Right flank pain EXAM: CT ABDOMEN AND PELVIS WITHOUT CONTRAST TECHNIQUE: Multidetector CT imaging of the abdomen and pelvis was performed following the standard protocol without IV contrast. RADIATION DOSE REDUCTION: This exam was performed according to the departmental dose-optimization program which includes automated exposure control,  adjustment of the mA and/or kV according to patient size and/or use of iterative reconstruction technique. COMPARISON:  None Available. FINDINGS: Lower chest: No acute abnormality. Hepatobiliary: No focal liver abnormality is seen. Status post cholecystectomy. No biliary dilatation. Pancreas: Unremarkable Spleen: Un remark Adrenals/Urinary Tract: The adrenal glands are unremarkable. The kidneys are normal in size and position. Mild right hydronephrosis and hydroureter to the level of the right ureterovesicular junction where an obstructing 2 mm calculus is seen. No additional nephro or urolithiasis identified. No hydronephrosis on the left. No perinephric fluid collections. The bladder is decompressed and is unremarkable. Stomach/Bowel: Moderate descending and sigmoid colonic diverticulosis. Stomach,  small bowel, and large bowel are otherwise unremarkable. No evidence of obstruction or focal inflammation. Appendix normal. No free intraperitoneal gas or fluid. Vascular/Lymphatic: No significant vascular findings are present. No enlarged abdominal or pelvic lymph nodes. Reproductive: Uterus and bilateral adnexa are unremarkable. Other: No abdominal wall hernia. Musculoskeletal: No acute bone abnormality. No lytic or blastic bone lesion. IMPRESSION: 1. Obstructing 2 mm calculus at the right ureterovesicular junction resulting in mild right hydronephrosis. 2. Moderate distal colonic diverticulosis without superimposed acute inflammatory change. Electronically Signed   By: Helyn Numbers M.D.   On: 10/01/2022 00:27    Assessment & Plan:    Primary hypertension - Her BP is well controlled. -     CBC with Differential/Platelet; Future -     Urinalysis, Routine w reflex microscopic; Future -     Basic metabolic panel; Future  Right flank pain, chronic- The workup is reassuring. -     Urinalysis, Routine w reflex microscopic; Future -     Lipase; Future -     Basic metabolic panel; Future -     Hepatic  function panel; Future  CKD (chronic kidney disease) stage 2, GFR 60-89 ml/min- Renal function is stable.     Follow-up: Return in about 6 months (around 09/21/2023).  Sanda Linger, MD

## 2023-04-27 ENCOUNTER — Other Ambulatory Visit (HOSPITAL_COMMUNITY): Payer: Self-pay

## 2023-04-27 ENCOUNTER — Other Ambulatory Visit: Payer: Self-pay | Admitting: Internal Medicine

## 2023-04-27 DIAGNOSIS — K219 Gastro-esophageal reflux disease without esophagitis: Secondary | ICD-10-CM

## 2023-04-27 MED ORDER — ESOMEPRAZOLE MAGNESIUM 40 MG PO CPDR
40.0000 mg | DELAYED_RELEASE_CAPSULE | Freq: Every day | ORAL | 0 refills | Status: DC
  Filled 2023-04-27: qty 90, 90d supply, fill #0

## 2023-05-04 ENCOUNTER — Ambulatory Visit: Payer: Commercial Managed Care - PPO | Admitting: Family Medicine

## 2023-05-19 ENCOUNTER — Other Ambulatory Visit: Payer: Self-pay | Admitting: Internal Medicine

## 2023-05-19 ENCOUNTER — Other Ambulatory Visit (HOSPITAL_COMMUNITY): Payer: Self-pay

## 2023-05-19 DIAGNOSIS — I10 Essential (primary) hypertension: Secondary | ICD-10-CM

## 2023-05-19 MED ORDER — OLMESARTAN MEDOXOMIL 40 MG PO TABS
40.0000 mg | ORAL_TABLET | Freq: Every day | ORAL | 1 refills | Status: DC
  Filled 2023-05-19: qty 90, 90d supply, fill #0
  Filled 2023-08-02: qty 90, 90d supply, fill #1

## 2023-05-24 ENCOUNTER — Ambulatory Visit: Payer: Self-pay | Admitting: Internal Medicine

## 2023-05-24 NOTE — Telephone Encounter (Signed)
 Copied from CRM [id number]. Topic: Clinical - Red Word Triage >> May 24, 2023  3:56 PM Shelbie Proctor wrote: Red Word that prompted transfer to Nurse Triage: Patient [phone] states sinus congestion, headaches, pain in eyes, sneezing, dizziness, light headed, fatigue, and dry coughing.  Patient denies a fever, and vision issues. Patient would like to be seen, please advise.   Chief Complaint: Sinus pain Symptoms: Sinus pain, sinus congestion, cough, fatigue  Frequency: Constant  Pertinent Negatives: Patient denies fever Disposition: [] ED /[] Urgent Care (no appt availability in office) / [x] Appointment(In office/virtual)/ []  [facility]/ [] Home Care/ [] Refused Recommended Disposition /[] [facility]/ []  Follow-up with PCP Additional Notes: Patient reports that for the last 3 days she has been experiencing sinus congestion and pain. She states her pain is around her eyes. She states she is also experiencing a cough, runny nose, and fatigue. She states she had some dizziness yesterday that is now resolved. She denies any fevers. Virtual appointment made at an available office tomorrow.     Reason for Disposition  [1] Sinus congestion (pressure, fullness) AND [2] present > 10 days    Present for 3 days, similar to sinus infection in the past  Answer Assessment - Initial Assessment Questions 1. LOCATION: "Where does it hurt?"      Eyebrows and eyes 2. ONSET: "When did the sinus pain start?"  (e.g., hours, days)      3 days ago  3. SEVERITY: "How bad is the pain?"   (Scale 1-10; mild, moderate or severe)   - MILD (1-3): doesn't interfere with normal activities    - MODERATE (4-7): interferes with normal activities (e.g., work or school) or awakens from sleep   - SEVERE (8-10): excruciating pain and patient unable to do any normal activities        5/10 yesterday, improved today  4. RECURRENT SYMPTOM: "Have you ever had sinus problems before?" If Yes, ask: "When was the last  time?" and "What happened that time?"      Yes, with a sinus infection  5. NASAL CONGESTION: "Is the nose blocked?" If Yes, ask: "Can you open it or must you breathe through your mouth?"     Yes 6. NASAL DISCHARGE: "Do you have discharge from your nose?" If so ask, "What color?"     Clear 7. FEVER: "Do you have a fever?" If Yes, ask: "What is it, how was it measured, and when did it start?"      No 8. OTHER SYMPTOMS: "Do you have any other symptoms?" (e.g., sore throat, cough, earache, difficulty breathing)     Cough, headache  Protocols used: Sinus Pain or Congestion-A-AH

## 2023-05-25 ENCOUNTER — Other Ambulatory Visit: Payer: Self-pay

## 2023-05-25 ENCOUNTER — Telehealth (INDEPENDENT_AMBULATORY_CARE_PROVIDER_SITE_OTHER): Payer: Commercial Managed Care - PPO | Admitting: Family Medicine

## 2023-05-25 DIAGNOSIS — J019 Acute sinusitis, unspecified: Secondary | ICD-10-CM

## 2023-05-25 MED ORDER — AZITHROMYCIN 250 MG PO TABS
ORAL_TABLET | ORAL | 0 refills | Status: AC
  Filled 2023-05-25: qty 6, 5d supply, fill #0

## 2023-05-25 NOTE — Progress Notes (Addendum)
 Virtual Visit via Video Note  I connected with Paula Parker on 05/25/23 at 10:19 AM by a video enabled telemedicine application and verified that I am speaking with the correct person using two identifiers.  Patient Location: Home Provider Location: Office/Clinic  I discussed the limitations, risks, security, and privacy concerns of performing an evaluation and management service by video and the availability of in person appointments. I also discussed with the patient that there may be a patient responsible charge related to this service. The patient expressed understanding and agreed to proceed.  Subjective: PCP: Alyson Reedy, FNP  SINUS CONGESTION & PAIN:  Onset: Sunday and have been worsening    Symptoms Cough: yes Runny nose/nasal congestion: yes Sneezing: yes  Fever: no    Sinus Pressure: yes  Ears Blocked: no  Teeth Ache: no  Frontal Headache: yes , mostly above eyebrows. Tylenol helps relieve the pain temporarily.  Second sickening: no   Denies fever/chills, shortness of breath, difficulty breathing, wheezing, chest pain, myalgias, N/V, abdominal pain.   PMH Sinusitis or Recurrent OM: no  PMH Prior Sinus or Ear Surgery: no  Recent antibiotic usage (last 30 days): no  PMH of Diabetes or Immunocompromise: no   Red flags Change in mental state: no Change in vision: no Rash: no   ROS: Per HPI  Current Outpatient Medications:    azithromycin (ZITHROMAX) 250 MG tablet, Take 2 tablets by mouth on day 1, then 1 tablet daily on days 2 through 5, Disp: 6 tablet, Rfl: 0   Cholecalciferol 50 MCG (2000 UT) TABS, Take 1 tablet (2,000 Units total) by mouth daily., Disp: 90 tablet, Rfl: 1   esomeprazole (NEXIUM) 40 MG capsule, Take 1 capsule (40 mg total) by mouth daily at 12 noon., Disp: 90 capsule, Rfl: 0   olmesartan (BENICAR) 40 MG tablet, Take 1 tablet (40 mg total) by mouth daily., Disp: 90 tablet, Rfl: 1   promethazine-dextromethorphan (PROMETHAZINE-DM) 6.25-15  MG/5ML syrup, Take 5 mLs by mouth 4 (four) times daily as needed for cough., Disp: 118 mL, Rfl: 0   Tiotropium Bromide Monohydrate (SPIRIVA RESPIMAT) 2.5 MCG/ACT AERS, Inhale 2 puffs into the lungs daily., Disp: 12 g, Rfl: 1  Observations/Objective: There were no vitals filed for this visit.  General: Alert and oriented x 4. Speaking in clear and full sentences, no audible heavy breathing, no acute distress.  Sounds alert and appropriately interactive.  Appears well.  Face symmetric.  Extraocular movements intact.  Pupils equal and round.  No nasal flaring or accessory muscle use visualized.  Assessment and Plan: 1. Acute rhinosinusitis (Primary) Patient is a pleasant 63 year old female patient who presents today for an acute virtual visit.  She reports sinus congestion, pain, and pressure that have been going on since Sunday.  She reports headache above her eyebrows, sneezing, nasal congestion and cough.  Denies shortness of breath, difficulty breathing, chest pain, fever/chills.  Discussed that physical exam is minimal due to virtual visit.  Advised patient that it does sound like she is experiencing acute sinusitis.  Counseled patient to continue use of Tylenol, saline nasal spray and Coricidin HBP for symptomatic relief.  Reasonable to prescribe antibiotic at this time.  Reviewed medication allergies.  Advised patient to follow-up with PCP if symptoms worsen or do not improve. - azithromycin (ZITHROMAX) 250 MG tablet; Take 2 tablets by mouth on day 1, then 1 tablet daily on days 2 through 5  Dispense: 6 tablet; Refill: 0   Follow Up Instructions: Return if  symptoms worsen or fail to improve.   I discussed the assessment and treatment plan with the patient. The patient was provided an opportunity to ask questions, and all were answered. The patient agreed with the plan and demonstrated an understanding of the instructions.   The patient was advised to call back or seek an in-person evaluation  if the symptoms worsen or if the condition fails to improve as anticipated.  The above assessment and management plan was discussed with the patient. The patient verbalized understanding of and has agreed to the management plan.   Alyson Reedy, FNP

## 2023-05-25 NOTE — Telephone Encounter (Signed)
 Pt has appt made with another office 2/19

## 2023-05-25 NOTE — Patient Instructions (Addendum)
 You have a viral infection that will resolve on its own over time.  Symptomatic treatment is ideal at this time. Symptoms will last 3-7 days but can stretch out to 10-14 days. Call if you are not improving by 7-10 days as this may have progressed to a bacterial infection.  You can use nasal saline to help with nasal drainage/congestion. Humidification may also be beneficial.   Vitamin Regimen:  Vitamin C 500mg  twice daily  Vitamin D 5000 units once daily  Zinc 50-75mg  once daily   Over the counter Medications (*unless allergic or contraindicated*):  Use Tylenol (acetaminophen) for fever/pain Use Advil (ibuprofen) for fever/pain/inflammation   Non-Medication Therapy:  Drink plenty of fluids and stay hydrated.  A teaspoon of honey may help ease coughing symptoms.  Cough drops or hard candy for coughing.   Over the Counter Medication Therapy:  Use a cough expectorant such as guaifenesin (Mucinex) if recommended by your doctor for a wet, congested cough. If you have high blood pressure, please ask your doctor first before using this.  If you have high blood pressure, medication such as Coricidin HBP is safe to take for your cough and will not increase your blood pressure.

## 2023-08-02 ENCOUNTER — Other Ambulatory Visit: Payer: Self-pay | Admitting: Internal Medicine

## 2023-08-02 ENCOUNTER — Other Ambulatory Visit (HOSPITAL_COMMUNITY): Payer: Self-pay

## 2023-08-02 DIAGNOSIS — K219 Gastro-esophageal reflux disease without esophagitis: Secondary | ICD-10-CM

## 2023-08-03 ENCOUNTER — Emergency Department: Admission: EM | Admit: 2023-08-03 | Discharge: 2023-08-03 | Disposition: A | Discharge status: Home / Self Care

## 2023-08-03 ENCOUNTER — Other Ambulatory Visit (HOSPITAL_COMMUNITY): Payer: Self-pay

## 2023-08-03 ENCOUNTER — Other Ambulatory Visit: Payer: Self-pay

## 2023-08-03 DIAGNOSIS — I129 Hypertensive chronic kidney disease with stage 1 through stage 4 chronic kidney disease, or unspecified chronic kidney disease: Secondary | ICD-10-CM | POA: Insufficient documentation

## 2023-08-03 DIAGNOSIS — T63461A Toxic effect of venom of wasps, accidental (unintentional), initial encounter: Secondary | ICD-10-CM | POA: Diagnosis present

## 2023-08-03 DIAGNOSIS — N189 Chronic kidney disease, unspecified: Secondary | ICD-10-CM | POA: Diagnosis not present

## 2023-08-03 MED ORDER — HYDROCORTISONE 2.5 % EX OINT
TOPICAL_OINTMENT | Freq: Two times a day (BID) | CUTANEOUS | 0 refills | Status: DC
  Filled 2023-08-03: qty 28.35, 30d supply, fill #0

## 2023-08-03 NOTE — ED Triage Notes (Addendum)
 Pt to ED via POV from home. Pt reports stung by a wasp on her right ring finger PTA. Pt has swelling to finger. Pt has rash to stomach. Pt also reports feels like she is clearing her throat more.  PT denies SOB. Pt took xyzal  5mg  PTA. No known bee allergy

## 2023-08-03 NOTE — ED Notes (Signed)
 Pt d/c home per EDP order. Discharge summary reviewed, pt verbalizes understanding. NAD. Ambulatory.

## 2023-08-03 NOTE — ED Provider Notes (Signed)
 [facility] Provider Note    Event Date/Time   First MD Initiated Contact with Patient 08/03/23 1300     (approximate)   History   Allergic Reaction   HPI  Paula Parker is a 63 y.o. female  with history of DJD, HTN, CKD, and as listed in EMR presents to the emergency department for evaluation after being stung by a wasp on the right ring finger and right abdomen. She denies shortness of breath, difficulty breathing, voice changes or sensation of airway swelling.  Initially areas were larger and more red.  Symptoms are gradually improving as time goes on. No history of allergy to bee stings.      Physical Exam   Triage Vital Signs: ED Triage Vitals [08/03/23 1134]  Encounter Vitals Group     BP (!) 132/90     Systolic BP Percentile      Diastolic BP Percentile      Pulse Rate 83     Resp 20     Temp 98.1 F (36.7 C)     Temp Source Oral     SpO2 95 %     Weight      Height      Head Circumference      Peak Flow      Pain Score 4     Pain Loc      Pain Education      Exclude from Growth Chart     Most recent vital signs: Vitals:   08/03/23 1134  BP: (!) 132/90  Pulse: 83  Resp: 20  Temp: 98.1 F (36.7 C)  SpO2: 95%    General: Awake, no distress.  CV:  Good peripheral perfusion.  Resp:  Normal effort.  Abd:  No distention.  Other:  No airway edema.  No dysphagia.  Erythema and mild edema over right ring finger, proximal.  Indurated, erythematous area on the right abdomen.   ED Results / Procedures / Treatments   Labs (all labs ordered are listed, but only abnormal results are displayed) Labs Reviewed - No data to display   EKG  Not indicated   RADIOLOGY  Image and radiology report reviewed and interpreted by me. Radiology report consistent with the same.  Not indicated  PROCEDURES:  Critical Care performed: No  Procedures   MEDICATIONS ORDERED IN ED:  Medications - No data to display   IMPRESSION /  MDM / ASSESSMENT AND PLAN / ED COURSE   I have reviewed the triage note.  Differential diagnosis includes, but is not limited to, local reaction to bee sting, allergic reaction, anaphylaxis  Patient's presentation is most consistent with acute, uncomplicated illness.  63 year old female presenting to the emergency department about 30 minutes after being stung by wasp.  See HPI for further details.  Vital signs indicate that she is mildly hypertensive which is likely due to to feeling anxious about being stung.   Exam is reassuring.  Patient states that she had a slight rash on her abdomen when she got here but that has nearly resolved.  She reports that both areas were more red and swollen just after the sting.  Plan will be to have her use some hydrocortisone  cream over the areas.  Outpatient follow-up and ER return precautions discussed.      FINAL CLINICAL IMPRESSION(S) / ED DIAGNOSES   Final diagnoses:  Wasp sting, accidental or unintentional, initial encounter     Rx / DC Orders   ED  Discharge Orders          Ordered    hydrocortisone  2.5 % ointment  2 times daily        08/03/23 1322             Note:  This document was prepared using Dragon voice recognition software and may include unintentional dictation errors.   Sherryle Don, FNP 08/03/23 1329    Collis Deaner, MD 08/04/23 [phone]

## 2023-08-04 ENCOUNTER — Other Ambulatory Visit: Payer: Self-pay | Admitting: Internal Medicine

## 2023-08-04 ENCOUNTER — Other Ambulatory Visit: Payer: Self-pay | Admitting: Family Medicine

## 2023-08-04 ENCOUNTER — Other Ambulatory Visit: Payer: Self-pay

## 2023-08-04 ENCOUNTER — Ambulatory Visit: Payer: Self-pay

## 2023-08-04 ENCOUNTER — Other Ambulatory Visit (HOSPITAL_COMMUNITY): Payer: Self-pay

## 2023-08-04 DIAGNOSIS — K219 Gastro-esophageal reflux disease without esophagitis: Secondary | ICD-10-CM

## 2023-08-04 MED ORDER — ESOMEPRAZOLE MAGNESIUM 40 MG PO CPDR
40.0000 mg | DELAYED_RELEASE_CAPSULE | Freq: Every day | ORAL | 0 refills | Status: DC
Start: 2023-08-04 — End: 2023-11-14
  Filled 2023-08-04: qty 90, 90d supply, fill #0

## 2023-08-04 NOTE — Telephone Encounter (Signed)
 Patient was informed that refill request was sent to the wrong office and that it will be forwarded to her PCP [name], MD

## 2023-08-04 NOTE — Telephone Encounter (Signed)
 Copied from CRM [id number]. Topic: Clinical - Medication Refill >> Aug 04, 2023  7:54 AM Marlan Silva wrote: Most Recent Primary Care Visit:  Provider: Wilhelmena Hanson  Department: [facility]  Visit Type: ACUTE  Date: 05/25/2023  Medication: esomeprazole  (NEXIUM ) 40 MG capsule  Has the patient contacted their pharmacy? Yes (Agent: If no, request that the patient contact the pharmacy for the refill. If patient does not wish to contact the pharmacy document the reason why and proceed with request.) (Agent: If yes, when and what did the pharmacy advise?)  Is this the correct pharmacy for this prescription? Yes If no, delete pharmacy and type the correct one.  This is the patient's preferred pharmacy:  [place] [facility] [address], [address] [place] [address] Phone: [phone] Fax: [phone]    Has the prescription been filled recently? Yes  Is the patient out of the medication? Yes  Has the patient been seen for an appointment in the last year OR does the patient have an upcoming appointment? Yes  Can we respond through MyChart? Yes  Agent: Please be advised that Rx refills may take up to 3 business days. We ask that you follow-up with your pharmacy.

## 2023-08-04 NOTE — Telephone Encounter (Signed)
 Patient states hat she has been calling since Tuesday of last week to request a refill on her esomeprazole  (NEXIUM ) 40 MG capsule. There is also a pending crm. Patient is requesting it gets filled today and for someone to contact her once the rx has been sent.

## 2023-09-21 ENCOUNTER — Encounter: Payer: Self-pay | Admitting: Internal Medicine

## 2023-09-21 ENCOUNTER — Other Ambulatory Visit: Payer: Self-pay

## 2023-09-21 ENCOUNTER — Ambulatory Visit (INDEPENDENT_AMBULATORY_CARE_PROVIDER_SITE_OTHER)

## 2023-09-21 ENCOUNTER — Ambulatory Visit: Payer: Commercial Managed Care - PPO | Admitting: Internal Medicine

## 2023-09-21 ENCOUNTER — Ambulatory Visit: Payer: Self-pay | Admitting: Internal Medicine

## 2023-09-21 VITALS — BP 156/96 | HR 76 | Temp 98.5°F | Resp 16 | Ht 61.0 in | Wt 171.2 lb

## 2023-09-21 DIAGNOSIS — N1330 Unspecified hydronephrosis: Secondary | ICD-10-CM

## 2023-09-21 DIAGNOSIS — R0609 Other forms of dyspnea: Secondary | ICD-10-CM | POA: Insufficient documentation

## 2023-09-21 DIAGNOSIS — I10 Essential (primary) hypertension: Secondary | ICD-10-CM | POA: Diagnosis not present

## 2023-09-21 DIAGNOSIS — R052 Subacute cough: Secondary | ICD-10-CM | POA: Insufficient documentation

## 2023-09-21 DIAGNOSIS — E785 Hyperlipidemia, unspecified: Secondary | ICD-10-CM

## 2023-09-21 LAB — URINALYSIS, ROUTINE W REFLEX MICROSCOPIC
Bilirubin Urine: NEGATIVE
Hgb urine dipstick: NEGATIVE
Ketones, ur: NEGATIVE
Leukocytes,Ua: NEGATIVE
Nitrite: NEGATIVE
Specific Gravity, Urine: 1.01 (ref 1.000–1.030)
Total Protein, Urine: NEGATIVE
Urine Glucose: NEGATIVE
Urobilinogen, UA: 0.2 (ref 0.0–1.0)
pH: 7 (ref 5.0–8.0)

## 2023-09-21 LAB — HEPATIC FUNCTION PANEL
ALT: 24 U/L (ref 0–35)
AST: 24 U/L (ref 0–37)
Albumin: 4.4 g/dL (ref 3.5–5.2)
Alkaline Phosphatase: 44 U/L (ref 39–117)
Bilirubin, Direct: 0.1 mg/dL (ref 0.0–0.3)
Total Bilirubin: 0.4 mg/dL (ref 0.2–1.2)
Total Protein: 6.8 g/dL (ref 6.0–8.3)

## 2023-09-21 LAB — CBC WITH DIFFERENTIAL/PLATELET
Basophils Absolute: 0 10*3/uL (ref 0.0–0.1)
Basophils Relative: 0.6 % (ref 0.0–3.0)
Eosinophils Absolute: 0.1 10*3/uL (ref 0.0–0.7)
Eosinophils Relative: 2.8 % (ref 0.0–5.0)
HCT: 42.6 % (ref 36.0–46.0)
Hemoglobin: 14 g/dL (ref 12.0–15.0)
Lymphocytes Relative: 27.9 % (ref 12.0–46.0)
Lymphs Abs: 1.4 10*3/uL (ref 0.7–4.0)
MCHC: 33 g/dL (ref 30.0–36.0)
MCV: 95.7 fl (ref 78.0–100.0)
Monocytes Absolute: 0.5 10*3/uL (ref 0.1–1.0)
Monocytes Relative: 9.4 % (ref 3.0–12.0)
Neutro Abs: 3 10*3/uL (ref 1.4–7.7)
Neutrophils Relative %: 59.3 % (ref 43.0–77.0)
Platelets: 249 10*3/uL (ref 150.0–400.0)
RBC: 4.45 Mil/uL (ref 3.87–5.11)
RDW: 13.5 % (ref 11.5–15.5)
WBC: 5.1 10*3/uL (ref 4.0–10.5)

## 2023-09-21 LAB — LIPID PANEL
Cholesterol: 172 mg/dL (ref 0–200)
HDL: 71.7 mg/dL (ref >39.00)
LDL Cholesterol: 88 mg/dL (ref 0–99)
NonHDL: 100.68
Total CHOL/HDL Ratio: 2
Triglycerides: 64 mg/dL (ref 0.0–149.0)
VLDL: 12.8 mg/dL (ref 0.0–40.0)

## 2023-09-21 LAB — BASIC METABOLIC PANEL WITH GFR
BUN: 16 mg/dL (ref 6–23)
CO2: 31 meq/L (ref 19–32)
Calcium: 9.3 mg/dL (ref 8.4–10.5)
Chloride: 106 meq/L (ref 96–112)
Creatinine, Ser: 0.88 mg/dL (ref 0.40–1.20)
GFR: 70.63 mL/min (ref >60.00)
Glucose, Bld: 101 mg/dL — ABNORMAL HIGH (ref 70–99)
Potassium: 4.1 meq/L (ref 3.5–5.1)
Sodium: 141 meq/L (ref 135–145)

## 2023-09-21 LAB — TROPONIN I (HIGH SENSITIVITY): High Sens Troponin I: 4 ng/L (ref 2–17)

## 2023-09-21 LAB — BRAIN NATRIURETIC PEPTIDE: Pro B Natriuretic peptide (BNP): 56 pg/mL (ref 0.0–100.0)

## 2023-09-21 LAB — TSH: TSH: 2.61 u[IU]/mL (ref 0.35–5.50)

## 2023-09-21 MED ORDER — INDAPAMIDE 1.25 MG PO TABS
1.2500 mg | ORAL_TABLET | Freq: Every day | ORAL | 0 refills | Status: DC
  Filled 2023-09-21: qty 90, 90d supply, fill #0

## 2023-09-21 NOTE — Patient Instructions (Signed)

## 2023-09-21 NOTE — Progress Notes (Unsigned)
 Subjective:  Patient ID: Paula Parker, female    DOB: 1960/10/27  Age: 63 y.o. MRN: [id number]  CC: Annual Exam and Hypertension   HPI [name] presents for a CPX and f/up ---  Outpatient Medications Prior to Visit  Medication Sig Dispense Refill   Cholecalciferol  50 MCG (2000 UT) TABS Take 1 tablet (2,000 Units total) by mouth daily. 90 tablet 1   esomeprazole  (NEXIUM ) 40 MG capsule Take 1 capsule (40 mg total) by mouth daily at 12 noon. 90 capsule 0   hydrocortisone  2.5 % ointment Apply topically 2 (two) times daily. 28.35 g 0   olmesartan  (BENICAR ) 40 MG tablet Take 1 tablet (40 mg total) by mouth daily. 90 tablet 1   Tiotropium Bromide  Monohydrate (SPIRIVA  RESPIMAT) 2.5 MCG/ACT AERS Inhale 2 puffs into the lungs daily. 12 g 1   No facility-administered medications prior to visit.    ROS Review of Systems  Respiratory:  Positive for cough and shortness of breath.     Objective:  BP (!) 156/96 (BP Location: Left Arm, Patient Position: Sitting)   Pulse 76   Temp 98.5 F (36.9 C) (Temporal)   Resp 16   Ht 5' 1 (1.549 m)   Wt 171 lb 3.2 oz (77.7 kg)   SpO2 95%   BMI 32.35 kg/m   BP Readings from Last 3 Encounters:  09/21/23 (!) 156/96  08/03/23 (!) 132/90  03/23/23 138/78    Wt Readings from Last 3 Encounters:  09/21/23 171 lb 3.2 oz (77.7 kg)  03/23/23 171 lb 6.4 oz (77.7 kg)  12/13/22 172 lb (78 kg)    Physical Exam Vitals reviewed.  Constitutional:      Appearance: Normal appearance.   Eyes:     General: No scleral icterus.    Conjunctiva/sclera: Conjunctivae normal.    Cardiovascular:     Rate and Rhythm: Normal rate and regular rhythm.     Heart sounds: No murmur heard.    No friction rub. No gallop.     Comments: EKG--- NSR, 65 bpm No LVH, Q waves, or ST/T wave changes  Pulmonary:     Effort: Pulmonary effort is normal.     Breath sounds: No stridor. No wheezing, rhonchi or rales.  Abdominal:     General: Abdomen is flat.      Palpations: There is no mass.     Tenderness: There is no abdominal tenderness. There is no guarding.     Hernia: No hernia is present.   Musculoskeletal:        General: No swelling. Normal range of motion.     Cervical back: Neck supple.     Right lower leg: No edema.     Left lower leg: No edema.  Lymphadenopathy:     Cervical: No cervical adenopathy.   Skin:    General: Skin is warm and dry.   Neurological:     General: No focal deficit present.     Mental Status: She is alert.   Psychiatric:        Mood and Affect: Mood normal.        Behavior: Behavior normal.     Lab Results  Component Value Date   WBC 5.1 09/21/2023   HGB 14.0 09/21/2023   HCT 42.6 09/21/2023   PLT 249.0 09/21/2023   GLUCOSE 101 (H) 09/21/2023   CHOL 172 09/21/2023   TRIG 64.0 09/21/2023   HDL 71.70 09/21/2023   LDLCALC 88 09/21/2023   ALT 24  09/21/2023   AST 24 09/21/2023   NA 141 09/21/2023   K 4.1 09/21/2023   CL 106 09/21/2023   CREATININE 0.88 09/21/2023   BUN 16 09/21/2023   CO2 31 09/21/2023   TSH 2.61 09/21/2023   INR 1.0 05/27/2021   HGBA1C 5.9 10/13/2022   DG Chest 2 View Result Date: 09/21/2023 CLINICAL DATA:  Cough and shortness of breath. EXAM: CHEST - 2 VIEW COMPARISON:  08/12/2020 FINDINGS: The heart size and mediastinal contours are within normal limits. Both lungs are clear. The visualized skeletal structures are unremarkable. IMPRESSION: No active cardiopulmonary disease. Electronically Signed   By: Donnal Fusi M.D.   On: 09/21/2023 11:56     Assessment & Plan:  DOE (dyspnea on exertion) -     EKG 12-Lead -     D-dimer, quantitative; Future -     Brain natriuretic peptide; Future -     Troponin I (High Sensitivity); Future -     CT CORONARY MORPH W/CTA COR W/SCORE W/CA W/CM &/OR WO/CM; Future  Primary hypertension -     EKG 12-Lead -     Basic metabolic panel with GFR; Future -     CBC with Differential/Platelet; Future -     TSH; Future -     Urinalysis,  Routine w reflex microscopic; Future -     Aldosterone + renin activity w/ ratio; Future -     Indapamide ; Take 1 tablet (1.25 mg total) by mouth daily.  Dispense: 90 tablet; Refill: 0  Dyslipidemia, goal LDL below 100 -     TSH; Future -     Hepatic function panel; Future -     Lipid panel; Future  Hydronephrosis of right kidney -     Urinalysis, Routine w reflex microscopic; Future  Subacute cough -     DG Chest 2 View; Future     Follow-up: Return in about 3 months (around 12/22/2023).  [name], MD

## 2023-09-26 ENCOUNTER — Encounter (HOSPITAL_COMMUNITY): Payer: Self-pay

## 2023-09-26 LAB — ALDOSTERONE + RENIN ACTIVITY W/ RATIO
ALDO / PRA Ratio: 5.1 ratio (ref 0.9–28.9)
Aldosterone: 3 ng/dL
Renin Activity: 0.59 ng/mL/h (ref 0.25–5.82)

## 2023-09-26 LAB — D-DIMER, QUANTITATIVE: D-Dimer, Quant: 0.24 ug{FEU}/mL (ref <0.50)

## 2023-09-28 ENCOUNTER — Ambulatory Visit: Admitting: Internal Medicine

## 2023-09-28 ENCOUNTER — Ambulatory Visit (HOSPITAL_COMMUNITY)

## 2023-09-28 ENCOUNTER — Other Ambulatory Visit (HOSPITAL_COMMUNITY): Payer: Self-pay

## 2023-09-29 ENCOUNTER — Other Ambulatory Visit (HOSPITAL_COMMUNITY): Payer: Self-pay

## 2023-09-29 ENCOUNTER — Ambulatory Visit: Admitting: Family Medicine

## 2023-09-29 ENCOUNTER — Encounter: Payer: Self-pay | Admitting: Family Medicine

## 2023-09-29 VITALS — BP 126/88 | HR 89 | Temp 98.3°F | Ht 61.0 in | Wt 171.2 lb

## 2023-09-29 DIAGNOSIS — J329 Chronic sinusitis, unspecified: Secondary | ICD-10-CM

## 2023-09-29 DIAGNOSIS — B9689 Other specified bacterial agents as the cause of diseases classified elsewhere: Secondary | ICD-10-CM | POA: Diagnosis not present

## 2023-09-29 DIAGNOSIS — R051 Acute cough: Secondary | ICD-10-CM | POA: Diagnosis not present

## 2023-09-29 MED ORDER — HYDROCODONE BIT-HOMATROP MBR 5-1.5 MG/5ML PO SOLN
5.0000 mL | Freq: Three times a day (TID) | ORAL | 0 refills | Status: DC | PRN
  Filled 2023-09-29: qty 120, 8d supply, fill #0

## 2023-09-29 MED ORDER — AZITHROMYCIN 250 MG PO TABS
ORAL_TABLET | ORAL | 0 refills | Status: AC
  Filled 2023-09-29: qty 6, 5d supply, fill #0

## 2023-09-29 NOTE — Progress Notes (Signed)
 Acute Office Visit  Subjective:     Patient ID: Paula Parker, female    DOB: 24-Sep-1960, 63 y.o.   MRN: [id number]  Chief Complaint  Patient presents with   Cough    Pt stated that she has been coughing since last night along with sneezing. When she blows her nose blood will come with i    HPI Patient is in today for evaluation of cough, sneezing, sinus pain and pressure, for the last week. Has tried cough drops, Tylenol  with minimal relief. States that husband and grandson have had similar illnesses. Denies abdominal pain, nausea, vomiting, diarrhea, rash, fever, chills, other symptoms.  Medical hx as outlined below.  ROS Per HPI      Objective:    BP 126/88 (BP Location: Left Arm, Patient Position: Sitting, Cuff Size: Large)   Pulse 89   Temp 98.3 F (36.8 C)   Ht 5' 1 (1.549 m)   Wt 171 lb 3.2 oz (77.7 kg)   PF 97 L/min   BMI 32.35 kg/m    Physical Exam Vitals and nursing note reviewed.  Constitutional:      General: She is not in acute distress.    Comments: Appears fatigued  HENT:     Head: Normocephalic and atraumatic.     Right Ear: External ear normal.     Left Ear: External ear normal.     Nose: No congestion.     Right Sinus: Frontal sinus tenderness present.     Left Sinus: Frontal sinus tenderness present.     Mouth/Throat:     Mouth: Mucous membranes are moist.     Pharynx: Oropharynx is clear. No oropharyngeal exudate or posterior oropharyngeal erythema.     Comments: Oropharyngeal cobblestoning    Eyes:     Extraocular Movements: Extraocular movements intact.    Cardiovascular:     Rate and Rhythm: Normal rate and regular rhythm.     Heart sounds: Normal heart sounds.  Pulmonary:     Effort: Pulmonary effort is normal. No respiratory distress.     Breath sounds: No wheezing, rhonchi or rales.     Comments: Mild cough in office  Musculoskeletal:     Cervical back: Normal range of motion and neck supple.  Lymphadenopathy:      Cervical: No cervical adenopathy.   Skin:    General: Skin is warm and dry.   Neurological:     General: No focal deficit present.     Mental Status: She is alert and oriented to person, place, and time.    No results found for any visits on 09/29/23.      Assessment & Plan:   Bacterial sinusitis -     Azithromycin ; Take 2 tablets (500 mg total) by mouth daily for 1 day, THEN 1 tablet (250 mg total) daily for 4 days.  Dispense: 6 tablet; Refill: 0  Acute cough -     HYDROcodone  Bit-Homatrop MBr; Take 5 mLs by mouth every 8 (eight) hours as needed for cough.  Dispense: 120 mL; Refill: 0     Meds ordered this encounter  Medications   azithromycin  (ZITHROMAX ) 250 MG tablet    Sig: Take 2 tablets (500 mg total) by mouth daily for 1 day, THEN 1 tablet (250 mg total) daily for 4 days.    Dispense:  6 tablet    Refill:  0   HYDROcodone  bit-homatropine (HYCODAN) 5-1.5 MG/5ML syrup    Sig: Take 5 mLs by mouth every  8 (eight) hours as needed for cough.    Dispense:  120 mL    Refill:  0    Return if symptoms worsen or fail to improve.  Corean LITTIE Ku, FNP

## 2023-09-29 NOTE — Patient Instructions (Signed)
 I have sent in azithromycin for you to take.  Take 2 tablets today, then 1 tablet daily for the next 4 days.  I have sent in hydrocodone cough syrup for you to take 5 mL once daily in the evening as needed for cough.  This medication may make you sleepy.  Do not drive or operate heavy machinery while taking this medication.  Follow-up with me for new or worsening symptoms.

## 2023-10-19 ENCOUNTER — Ambulatory Visit (HOSPITAL_COMMUNITY)
Admission: RE | Admit: 2023-10-19 | Discharge: 2023-10-19 | Disposition: A | Discharge status: Home / Self Care | Source: Ambulatory Visit | Attending: Internal Medicine | Admitting: Internal Medicine

## 2023-10-19 DIAGNOSIS — I2510 Atherosclerotic heart disease of native coronary artery without angina pectoris: Secondary | ICD-10-CM | POA: Insufficient documentation

## 2023-10-19 DIAGNOSIS — Q245 Malformation of coronary vessels: Secondary | ICD-10-CM | POA: Diagnosis not present

## 2023-10-19 DIAGNOSIS — R0609 Other forms of dyspnea: Secondary | ICD-10-CM | POA: Insufficient documentation

## 2023-10-19 MED ORDER — IOHEXOL 350 MG/ML SOLN
100.0000 mL | Freq: Once | INTRAVENOUS | Status: AC | PRN
  Administered 2023-10-19: 100 mL via INTRAVENOUS

## 2023-10-19 MED ORDER — NITROGLYCERIN 0.4 MG SL SUBL
0.8000 mg | SUBLINGUAL_TABLET | Freq: Once | SUBLINGUAL | Status: AC
  Administered 2023-10-19: 0.8 mg via SUBLINGUAL

## 2023-10-20 ENCOUNTER — Encounter: Payer: Self-pay | Admitting: Internal Medicine

## 2023-10-20 NOTE — Telephone Encounter (Signed)
 Copied from CRM [id number]. Topic: Clinical - Lab/Test Results >> Oct 20, 2023  3:48 PM Mia F wrote: Reason for CRM: Pt is calling in regards to the CT she had done yesterday. She says that Dr Joshua told her everything looked good and then she read the results on her own that mentioned  other findings and it stopped there. She wants to know what the other findings are if there are any. Pt can be reached via phone or My Chart

## 2023-10-28 ENCOUNTER — Ambulatory Visit: Payer: Self-pay

## 2023-10-28 NOTE — Telephone Encounter (Signed)
 FYI Only or Action Required?: FYI only for provider.  Patient was last seen in primary care on 09/29/2023 by Alvia Corean CROME, FNP.  Called Nurse Triage reporting Otalgia.  Symptoms began today.  Interventions attempted: OTC medications: neosporin.  Symptoms are: gradually improving.  Triage Disposition: Home Care  Patient/caregiver understands and will follow disposition?: Yes Answer Assessment - Initial Assessment Questions Patient reports she felt a sore, small bump in her ear and it popped when she pressed it with a q-tip. States it initially bled and then stopped. Denies changes in hearing or discharge other than the initial blood. Home care advice was provided by this RN. ED precautions advised and pt verbalized understanding.   Patient requested to make an appt Monday in case the mild discomfort in the area of the bump does not resolve over the weekend.   1. LOCATION: Which ear is involved?     Right ear 2. ONSET: When did the ear pain start?      After popping  3. SEVERITY: How bad is the pain?  (Scale 1-10; mild, moderate or severe)     3/10 4. URI SYMPTOMS: Do you have a runny nose or cough?     No 5. FEVER: Do you have a fever? If Yes, ask: What is your temperature, how was it measured, and when did it start?     No  Protocols used: Earache-A-AH Copied from CRM [id number]. Topic: Clinical - Red Word Triage >> Oct 28, 2023  5:00 PM Jayma L wrote: Red Word that prompted transfer to Nurse Triage: patient called in and stated she was having pain inside her right ear and she used a q-tip and popped something in her ear, now its bleeding and theres some worse pain maybe . Said for time being its stopped bleeding but wanted to let her pcp know

## 2023-10-31 ENCOUNTER — Other Ambulatory Visit (HOSPITAL_COMMUNITY): Payer: Self-pay

## 2023-10-31 ENCOUNTER — Encounter: Payer: Self-pay | Admitting: Family Medicine

## 2023-10-31 ENCOUNTER — Ambulatory Visit: Admitting: Family Medicine

## 2023-10-31 VITALS — BP 130/86 | HR 86 | Temp 98.2°F | Resp 18 | Ht 61.0 in | Wt 172.0 lb

## 2023-10-31 DIAGNOSIS — S00411A Abrasion of right ear, initial encounter: Secondary | ICD-10-CM | POA: Diagnosis not present

## 2023-10-31 MED ORDER — CIPROFLOXACIN-DEXAMETHASONE 0.3-0.1 % OT SUSP
4.0000 [drp] | Freq: Two times a day (BID) | OTIC | 0 refills | Status: AC
  Filled 2023-10-31: qty 7.5, 7d supply, fill #0

## 2023-10-31 NOTE — Progress Notes (Signed)
 Assessment & Plan:  1. Abrasion of right ear canal, initial encounter (Primary) Likely rupture of a bump in the ear canal caused tenderness and bleeding. No foreign bodies found.  - ciprofloxacin -dexamethasone  (CIPRODEX ) OTIC suspension; Place 4 drops into the right ear 2 (two) times daily for 7 days.  Dispense: 7.5 mL; Refill: 0 - Advised to remain on side for 10 minutes post-administration. - Recommended using a cotton ball to prevent leakage if mobile.  Work Absence Documentation She requested documentation for work absence due to medical visit. - Provided return-to-work note for November 01, 2023.      Follow up plan: Return if symptoms worsen or fail to improve.  Niki Rung, MSN, APRN, FNP-C  Subjective:  HPI: Paula Parker is a 63 y.o. female presenting on 10/31/2023 for Ear Pain (Right - questionable Bump, q-tip had some blood drainage on it on Friday /Lymph enlargement on right side )  Discussed the use of AI scribe software for clinical note transcription with the patient, who gave verbal consent to proceed.  Patient experienced significant pain and noticed blood on the Q-tip while cleaning her ear. The pain is tender and localized within the ear canal. She is concerned about the possibility of a tick or other foreign body in her ear. She mentions that her lymph nodes on the affected side feel swollen. She has tried using alcohol on a Q-tip to clean the area. She recalls a past incident where a moth flew into her ear, causing significant distress until it was removed at a hospital.        ROS: Negative unless specifically indicated above in HPI.   Relevant past medical history reviewed and updated as indicated.   Allergies and medications reviewed and updated.   Current Outpatient Medications:    Cholecalciferol  50 MCG (2000 UT) TABS, Take 1 tablet (2,000 Units total) by mouth daily., Disp: 90 tablet, Rfl: 1   ciprofloxacin -dexamethasone  (CIPRODEX ) OTIC suspension,  Place 4 drops into the right ear 2 (two) times daily for 7 days., Disp: 7.5 mL, Rfl: 0   esomeprazole  (NEXIUM ) 40 MG capsule, Take 1 capsule (40 mg total) by mouth daily at 12 noon., Disp: 90 capsule, Rfl: 0   HYDROcodone  bit-homatropine (HYCODAN) 5-1.5 MG/5ML syrup, Take 5 mLs by mouth every 8 (eight) hours as needed for cough., Disp: 120 mL, Rfl: 0   olmesartan  (BENICAR ) 40 MG tablet, Take 1 tablet (40 mg total) by mouth daily., Disp: 90 tablet, Rfl: 1   Tiotropium Bromide  Monohydrate (SPIRIVA  RESPIMAT) 2.5 MCG/ACT AERS, Inhale 2 puffs into the lungs daily., Disp: 12 g, Rfl: 1   indapamide  (LOZOL ) 1.25 MG tablet, Take 1 tablet (1.25 mg total) by mouth daily. (Patient not taking: Reported on 10/31/2023), Disp: 90 tablet, Rfl: 0  Allergies  Allergen Reactions   Doxycycline Other (See Comments)    Throat swelling   Fish Allergy     Pt ate flounder and calves swelled   Oxycodone Other (See Comments)    Sweating    Benzonatate Rash and Other (See Comments)   Penicillins Rash    Has patient had a PCN reaction causing immediate rash, facial/tongue/throat swelling, SOB or lightheadedness with hypotension: Yes Has patient had a PCN reaction causing severe rash involving mucus membranes or skin necrosis: No Has patient had a PCN reaction that required hospitalization: No Has patient had a PCN reaction occurring within the last 10 years: Yes If all of the above answers are NO, then may proceed with Cephalosporin  use.  Tolerated Rocephin      Sulfonamide Derivatives Rash and Other (See Comments)    unknown    Objective:   BP 130/86   Pulse 86   Temp 98.2 F (36.8 C)   Resp 18   Ht 5' 1 (1.549 m)   Wt 172 lb (78 kg)   SpO2 95%   BMI 32.50 kg/m    Physical Exam Vitals reviewed.  Constitutional:      General: She is not in acute distress.    Appearance: Normal appearance. She is not ill-appearing, toxic-appearing or diaphoretic.  HENT:     Head: Normocephalic and atraumatic.      Right Ear: Tympanic membrane and external ear normal.     Left Ear: Tympanic membrane, ear canal and external ear normal.     Ears:     Comments: Blood in right ear canal. Eyes:     General: No scleral icterus.       Right eye: No discharge.        Left eye: No discharge.     Conjunctiva/sclera: Conjunctivae normal.  Cardiovascular:     Rate and Rhythm: Normal rate.  Pulmonary:     Effort: Pulmonary effort is normal. No respiratory distress.  Musculoskeletal:        General: Normal range of motion.     Cervical back: Normal range of motion.  Skin:    General: Skin is warm and dry.     Capillary Refill: Capillary refill takes less than 2 seconds.  Neurological:     General: No focal deficit present.     Mental Status: She is alert and oriented to person, place, and time. Mental status is at baseline.  Psychiatric:        Mood and Affect: Mood normal.        Behavior: Behavior normal.        Thought Content: Thought content normal.        Judgment: Judgment normal.

## 2023-11-14 ENCOUNTER — Other Ambulatory Visit (HOSPITAL_COMMUNITY): Payer: Self-pay

## 2023-11-14 ENCOUNTER — Other Ambulatory Visit: Payer: Self-pay | Admitting: Internal Medicine

## 2023-11-14 DIAGNOSIS — I10 Essential (primary) hypertension: Secondary | ICD-10-CM

## 2023-11-14 DIAGNOSIS — K219 Gastro-esophageal reflux disease without esophagitis: Secondary | ICD-10-CM

## 2023-11-14 MED ORDER — OLMESARTAN MEDOXOMIL 40 MG PO TABS
40.0000 mg | ORAL_TABLET | Freq: Every day | ORAL | 1 refills | Status: AC
  Filled 2023-11-14 – 2024-02-10 (×2): qty 90, 90d supply, fill #0

## 2023-11-14 MED ORDER — ESOMEPRAZOLE MAGNESIUM 40 MG PO CPDR
40.0000 mg | DELAYED_RELEASE_CAPSULE | Freq: Every day | ORAL | 1 refills | Status: AC
  Filled 2023-11-14 – 2024-02-10 (×2): qty 90, 90d supply, fill #0

## 2023-12-21 ENCOUNTER — Ambulatory Visit: Admitting: Internal Medicine

## 2024-01-18 ENCOUNTER — Encounter: Payer: Self-pay | Admitting: Internal Medicine

## 2024-01-18 ENCOUNTER — Ambulatory Visit: Admitting: Internal Medicine

## 2024-01-18 VITALS — BP 142/88 | HR 76 | Temp 98.4°F | Resp 16 | Ht 61.0 in | Wt 174.0 lb

## 2024-01-18 DIAGNOSIS — I10 Essential (primary) hypertension: Secondary | ICD-10-CM

## 2024-01-18 DIAGNOSIS — Z1231 Encounter for screening mammogram for malignant neoplasm of breast: Secondary | ICD-10-CM | POA: Insufficient documentation

## 2024-01-18 NOTE — Progress Notes (Signed)
 Subjective:  Patient ID: Paula Parker, female    DOB: 1961/03/15  Age: 63 y.o. MRN: [id number]  CC: Hypertension   HPI Paula Parker presents for f/up -  Discussed the use of AI scribe software for clinical note transcription with the patient, who gave verbal consent to proceed.  History of Present Illness Paula Parker is a 63 year old female with hypertension who presents with increased salt cravings and concerns about blood pressure management.  She has been experiencing increased cravings for salty foods, such as chips, which she consumes frequently after meals. This craving has led to weight gain, and she is concerned about its impact on her blood pressure. She has a history of hypertension and is currently taking a generic form of a medication she refers to as 'Banachar'. She previously took indapamide  but discontinued it due to side effects, including a sensation of head pressure and lightheadedness.  She experienced an accident two days ago but does not provide further details about it. No current headaches, blurred vision, pain, or shortness of breath. She reports some swelling in her legs and feet, which has decreased since yesterday. No dizziness or lightheadedness at present.  She has been informed of the beginning stages of cataracts, which affect her vision, particularly with distance and in low light conditions. She has an upcoming appointment to assess the progression of her cataracts.  She does not have a gallbladder and experiences diarrhea after eating, particularly after consuming salads. She does not take any medication for this and attributes it to the absence of her gallbladder.  She works two jobs, which involve a lot of walking, and she feels okay during physical activity. She has received a flu vaccine recently but has not had a COVID vaccine.     Outpatient Medications Prior to Visit  Medication Sig Dispense Refill   Cholecalciferol  50 MCG (2000 UT)  TABS Take 1 tablet (2,000 Units total) by mouth daily. 90 tablet 1   esomeprazole  (NEXIUM ) 40 MG capsule Take 1 capsule (40 mg total) by mouth daily at 12 noon. 90 capsule 1   olmesartan  (BENICAR ) 40 MG tablet Take 1 tablet (40 mg total) by mouth daily. 90 tablet 1   Tiotropium Bromide  Monohydrate (SPIRIVA  RESPIMAT) 2.5 MCG/ACT AERS Inhale 2 puffs into the lungs daily. 12 g 1   HYDROcodone  bit-homatropine (HYCODAN) 5-1.5 MG/5ML syrup Take 5 mLs by mouth every 8 (eight) hours as needed for cough. 120 mL 0   indapamide  (LOZOL ) 1.25 MG tablet Take 1 tablet (1.25 mg total) by mouth daily. (Patient not taking: Reported on 10/31/2023) 90 tablet 0   No facility-administered medications prior to visit.    ROS Review of Systems  Constitutional:  Positive for unexpected weight change (wt gain). Negative for appetite change, chills, diaphoresis, fatigue and fever.  HENT: Negative.  Negative for trouble swallowing.   Respiratory: Negative.  Negative for chest tightness, shortness of breath and wheezing.   Cardiovascular:  Negative for chest pain, palpitations and leg swelling.  Gastrointestinal:  Negative for abdominal pain, constipation, diarrhea, nausea and vomiting.  Endocrine: Negative.   Genitourinary: Negative.  Negative for difficulty urinating.  Musculoskeletal:  Negative for arthralgias, joint swelling and myalgias.  Skin: Negative.   Neurological:  Negative for dizziness, weakness and headaches.  Hematological:  Negative for adenopathy. Does not bruise/bleed easily.  Psychiatric/Behavioral: Negative.      Objective:  BP (!) 142/88 (BP Location: Right Arm, Patient Position: Sitting, Cuff Size: Large)  Pulse 76   Temp 98.4 F (36.9 C) (Oral)   Resp 16   Ht 5' 1 (1.549 m)   Wt 174 lb (78.9 kg)   SpO2 99%   BMI 32.88 kg/m   BP Readings from Last 3 Encounters:  01/18/24 (!) 142/88  10/31/23 130/86  10/19/23 125/88    Wt Readings from Last 3 Encounters:  01/18/24 174 lb (78.9  kg)  10/31/23 172 lb (78 kg)  09/29/23 171 lb 3.2 oz (77.7 kg)    Physical Exam Vitals reviewed.  HENT:     Nose: Nose normal.     Mouth/Throat:     Mouth: Mucous membranes are moist.  Eyes:     General: No scleral icterus.    Conjunctiva/sclera: Conjunctivae normal.  Cardiovascular:     Rate and Rhythm: Normal rate and regular rhythm.     Heart sounds: No murmur heard.    No friction rub. No gallop.  Pulmonary:     Effort: Pulmonary effort is normal.     Breath sounds: No stridor. No wheezing, rhonchi or rales.  Abdominal:     General: Abdomen is flat.     Palpations: There is no mass.     Tenderness: There is no abdominal tenderness. There is no guarding.     Hernia: No hernia is present.  Musculoskeletal:        General: Normal range of motion.     Cervical back: Neck supple.     Right lower leg: No edema.     Left lower leg: No edema.  Lymphadenopathy:     Cervical: No cervical adenopathy.  Skin:    General: Skin is warm and dry.  Neurological:     General: No focal deficit present.     Mental Status: She is alert. Mental status is at baseline.  Psychiatric:        Mood and Affect: Mood normal.        Behavior: Behavior normal.     Lab Results  Component Value Date   WBC 5.1 09/21/2023   HGB 14.0 09/21/2023   HCT 42.6 09/21/2023   PLT 249.0 09/21/2023   GLUCOSE 101 (H) 09/21/2023   CHOL 172 09/21/2023   TRIG 64.0 09/21/2023   HDL 71.70 09/21/2023   LDLCALC 88 09/21/2023   ALT 24 09/21/2023   AST 24 09/21/2023   NA 141 09/21/2023   K 4.1 09/21/2023   CL 106 09/21/2023   CREATININE 0.88 09/21/2023   BUN 16 09/21/2023   CO2 31 09/21/2023   TSH 2.61 09/21/2023   INR 1.0 05/27/2021   HGBA1C 5.9 10/13/2022    CT CORONARY MORPH W/CTA COR W/SCORE W/CA W/CM &/OR WO/CM Addendum Date: 10/24/2023 ADDENDUM REPORT: 10/24/2023 02:44 EXAM: OVER-READ INTERPRETATION  CT CHEST The following report is an over-read performed by radiologist Dr. Suzen Dials of  [facility] on 10/24/2023. This over-read does not include interpretation of cardiac or coronary anatomy or pathology. The coronary calcium score/coronary CTA interpretation by the cardiologist is attached. COMPARISON:  November 03, 2020 FINDINGS: Cardiovascular: There are no significant extracardiac vascular findings. Mediastinum/Nodes: There are no enlarged lymph nodes within the visualized mediastinum. Lungs/Pleura: There is no pleural effusion. Mild lingular and right middle lobe scarring and/or atelectatic changes are seen. Upper abdomen: No significant findings in the visualized upper abdomen. Musculoskeletal/Chest wall: No chest wall mass or suspicious osseous findings within the visualized chest. IMPRESSION: No significant extracardiac findings within the visualized chest. Electronically Signed   By: Suzen  Houston M.D.   On: 10/24/2023 02:44   Result Date: 10/24/2023 HISTORY: Chest pain/anginal equiv, high CAD risk, not treadmill candidate EXAM: Cardiac/Coronary  CT PROTOCOL: A non-contrast, gated CT scan was obtained with axial slices of 2.5 mm through the heart for calcium scoring. Calcium scoring was performed using the Agatston method. A 120 kV prospective, gated, contrast cardiac CT scan was obtained. Gantry rotation speed was 230 msec and collimation was 0.63 mm. Two sublingual nitroglycerin  tablets (0.8 mg) were given. The 3D data set was reconstructed with motion correction for the best systolic or diastolic phase. Images were analyzed on a dedicated workstation using MPR, MIP, and VRT modes. The patient received 95 cc of contrast. FINDINGS: Image quality: Average. Artifact: Limited. Coronary calcium score is 0, which places the patient in the 1st percentile for age and sex matched control. Coronary arteries: Normal coronary origins.  Right dominance. Left Main Coronary Artery: Normal caliber vessel, originates from the left coronary cusp, bifurcates to form a left anterior descending  artery (LAD) and a left circumflex artery (LCX). Left main is patent. Left Anterior Descending Artery: Normal caliber vessel, reaches the apex, gives off 2 diagonal branches. LAD is patent. Minimal soft plaque (< 25%) mid to distal LAD. Myocardial bridge within the distal LAD (normal variant). First Diagonal branch: Patent Second Diagonal branch: Patent Left Circumflex Artery: Normal caliber vessel, non-dominant, travels within the atrioventricular groove, gives off 1 obtuse marginal branches. LCx is patent. First Obtuse Marginal branch: Patent Right Coronary Artery: Dominant vessel, originates from the right coronary cusp, terminates as a PDA and right posterolateral branch. RCA is patent. Plaque Analysis: Calcified plaque 0 mm3 Non-calcified plaque 24 mm3 (low attenuation plaque 0 mm3). Total plaque volume (TPV) is 24 mm3, categorized as mild, which is 32nd percentile for age- and sex-matched controls. Aorta: Normal size, 29 mm at the mid ascending aorta (level of the PA bifurcation) measured double oblique. Aortic Valve: Native valve, trileaflet aortic valve, no significant calcification. Mitral valve: Native valve, no significant mitral annular calcification. Other findings: Normal pulmonary vein drainage into the left atrium. Normal left atrial appendage without thrombus. Normal size of the pulmonary artery. Please see separate report from [facility] for non-cardiac findings. IMPRESSION: 1. Coronary calcium score is 0, which places the patient in the 1st percentile for age and sex matched control. 2. Total plaque volume (TPV) is 24 mm3, categorized as mild, which is 32nd percentile for age- and sex-matched controls. 3. Normal coronary origins with right dominance. 4. CAD-RADS 1 minimal nonobstructive coronary disease. 5. Minimal soft plaque (< 25%) mid to distal LAD. Myocardial bridge within the distal LAD (normal variant). RECOMMENDATION: Consider non-atherosclerotic causes of chest pain. Consider  preventive therapy and risk factor modification. Electronically Signed: By: Madonna Large On: 10/19/2023 18:29    Assessment & Plan:   Primary hypertension- She has not achieved her BP goal. She does not want to add another agent. She agrees to work on her lifestyle modifications.  Screening mammogram for breast cancer -     Digital Screening Mammogram, Left and Right; Future     Follow-up: Return in about 3 months (around 04/19/2024).  Debby Molt, MD

## 2024-02-10 ENCOUNTER — Other Ambulatory Visit: Payer: Self-pay

## 2024-02-20 ENCOUNTER — Ambulatory Visit: Admitting: Family Medicine

## 2024-02-20 ENCOUNTER — Ambulatory Visit: Payer: Self-pay

## 2024-02-20 ENCOUNTER — Encounter: Payer: Self-pay | Admitting: Family Medicine

## 2024-02-20 VITALS — BP 142/90 | HR 74 | Temp 98.1°F | Resp 18 | Ht 61.0 in | Wt 172.0 lb

## 2024-02-20 DIAGNOSIS — R200 Anesthesia of skin: Secondary | ICD-10-CM

## 2024-02-20 DIAGNOSIS — R252 Cramp and spasm: Secondary | ICD-10-CM

## 2024-02-20 DIAGNOSIS — R410 Disorientation, unspecified: Secondary | ICD-10-CM

## 2024-02-20 DIAGNOSIS — I10 Essential (primary) hypertension: Secondary | ICD-10-CM | POA: Diagnosis not present

## 2024-02-20 LAB — POCT URINALYSIS DIPSTICK
Bilirubin, UA: NEGATIVE
Blood, UA: NEGATIVE
Glucose, UA: NEGATIVE
Ketones, UA: NEGATIVE
Leukocytes, UA: NEGATIVE
Nitrite, UA: NEGATIVE
Protein, UA: NEGATIVE
Spec Grav, UA: 1.01 (ref 1.010–1.025)
Urobilinogen, UA: NEGATIVE U/dL — AB
pH, UA: 7 (ref 5.0–8.0)

## 2024-02-20 LAB — CBC WITH DIFFERENTIAL/PLATELET
Basophils Absolute: 0 K/uL (ref 0.0–0.1)
Basophils Relative: 1 % (ref 0.0–3.0)
Eosinophils Absolute: 0.1 K/uL (ref 0.0–0.7)
Eosinophils Relative: 1.6 % (ref 0.0–5.0)
HCT: 42.8 % (ref 36.0–46.0)
Hemoglobin: 14.2 g/dL (ref 12.0–15.0)
Lymphocytes Relative: 46.1 % — ABNORMAL HIGH (ref 12.0–46.0)
Lymphs Abs: 2.2 K/uL (ref 0.7–4.0)
MCHC: 33.1 g/dL (ref 30.0–36.0)
MCV: 96.3 fl (ref 78.0–100.0)
Monocytes Absolute: 0.4 K/uL (ref 0.1–1.0)
Monocytes Relative: 8.2 % (ref 3.0–12.0)
Neutro Abs: 2 K/uL (ref 1.4–7.7)
Neutrophils Relative %: 43.1 % (ref 43.0–77.0)
Platelets: 269 K/uL (ref 150.0–400.0)
RBC: 4.45 Mil/uL (ref 3.87–5.11)
RDW: 13.1 % (ref 11.5–15.5)
WBC: 4.8 K/uL (ref 4.0–10.5)

## 2024-02-20 LAB — VITAMIN B12: Vitamin B-12: 194 pg/mL — ABNORMAL LOW (ref 211–911)

## 2024-02-20 LAB — TSH: TSH: 2.03 u[IU]/mL (ref 0.35–5.50)

## 2024-02-20 NOTE — Assessment & Plan Note (Signed)
 Uncontrolled. Managed with olmesartan . Blood pressure elevated at 142/90 mmHg. Discussed amlodipine as an alternative to previous diuretic, with potential for combination therapy. - Consider starting amlodipine 2.5 mg if patient becomes agreeable as she is hesitant to take medications.  - Discuss potential combination therapy with olmesartan  if amlodipine is effective.

## 2024-02-20 NOTE — Progress Notes (Signed)
 Assessment & Plan Confusion  Orders:   POCT urinalysis dipstick   CBC with Differential/Platelet; Future   Comprehensive metabolic panel with GFR; Future   TSH; Future   Vitamin B12; Future   Magnesium ; Future   RPR; Future  Numbness in both hands  Orders:   CBC with Differential/Platelet; Future   Comprehensive metabolic panel with GFR; Future   TSH; Future   Vitamin B12; Future  Muscle cramps  Orders:   CBC with Differential/Platelet; Future   Comprehensive metabolic panel with GFR; Future   TSH; Future   Vitamin B12; Future   Magnesium ; Future  Primary hypertension Uncontrolled. Managed with olmesartan . Blood pressure elevated at 142/90 mmHg. Discussed amlodipine as an alternative to previous diuretic, with potential for combination therapy. - Consider starting amlodipine 2.5 mg if patient becomes agreeable as she is hesitant to take medications.  - Discuss potential combination therapy with olmesartan  if amlodipine is effective.     Results for orders placed or performed in visit on 02/20/24  POCT urinalysis dipstick  Result Value Ref Range   Color, UA pale yellow    Clarity, UA clear    Glucose, UA Negative Negative   Bilirubin, UA neg    Ketones, UA neg    Spec Grav, UA 1.010 1.010 - 1.025   Blood, UA neg    pH, UA 7.0 5.0 - 8.0   Protein, UA Negative Negative   Urobilinogen, UA negative (A) 0.2 or 1.0 E.U./dL   Nitrite, UA neg    Leukocytes, UA Negative Negative   Appearance     Odor      Follow up plan: Return for as scheduled with PCP.  Niki Rung, MSN, APRN, FNP-C  Subjective:  HPI: Paula Parker is a 63 y.o. female presenting on 02/20/2024 for Altered Mental Status (Confusion at times - noticed a few little things, that seem off: forgetting directions, not comprehending, put bandaid on wrong finger. //Note: Eating a lot of potassium high foods, blurred vision slightly for last month. Would like some labs. )  Discussed the use of AI  scribe software for clinical note transcription with the patient, who gave verbal consent to proceed.  She has been experiencing confusion and numbness in her hands, described as a sensation of the hands 'going to sleep,' for about a month. The numbness occurs more frequently, and she is unsure if it is related to her sleeping position. She also experiences muscle cramps and has been consuming bananas to increase her potassium intake, suspecting a link to her symptoms.  She has been on olmesartan  for hypertension for about one to two years. Her blood pressure was recently 140/90 mmHg, with a history of elevated readings. She previously tried a diuretic but discontinued it due to adverse effects, such as lightheadedness when bending over.  She experiences episodes of forgetfulness and confusion, such as putting a Band-Aid on the wrong finger. 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 She feels overworked and stressed, which might affect her cognitive function.  She notes blurry vision and has progressive lenses. She mentioned that she was told she may have the beginning stages of cataracts.      ROS: Negative unless specifically indicated above in HPI.   Relevant past medical history reviewed and updated as indicated.   Allergies and medications reviewed and updated.   Current Outpatient Medications:    Cholecalciferol  50 MCG (2000 UT) TABS, Take 1 tablet (2,000 Units total) by mouth daily., Disp:  90 tablet, Rfl: 1   esomeprazole  (NEXIUM ) 40 MG capsule, Take 1 capsule (40 mg total) by mouth daily at 12 noon., Disp: 90 capsule, Rfl: 1   olmesartan  (BENICAR ) 40 MG tablet, Take 1 tablet (40 mg total) by mouth daily., Disp: 90 tablet, Rfl: 1   Tiotropium Bromide  Monohydrate (SPIRIVA  RESPIMAT) 2.5 MCG/ACT AERS, Inhale 2 puffs into the lungs daily. (Patient taking differently: Inhale 2 puffs into the lungs daily as needed.), Disp: 12 g, Rfl: 1  Allergies  Allergen Reactions    Doxycycline Other (See Comments)    Throat swelling   Indapamide  Other (See Comments)    HA   Fish Allergy     Pt ate flounder and calves swelled   Oxycodone Other (See Comments)    Sweating    Benzonatate Rash and Other (See Comments)   Penicillins Rash    Has patient had a PCN reaction causing immediate rash, facial/tongue/throat swelling, SOB or lightheadedness with hypotension: Yes Has patient had a PCN reaction causing severe rash involving mucus membranes or skin necrosis: No Has patient had a PCN reaction that required hospitalization: No Has patient had a PCN reaction occurring within the last 10 years: Yes If all of the above answers are NO, then may proceed with Cephalosporin use.  Tolerated Rocephin      Sulfonamide Derivatives Rash and Other (See Comments)    unknown    Objective:   BP (!) 142/90   Pulse 74   Temp 98.1 F (36.7 C)   Resp 18   Ht 5' 1 (1.549 m)   Wt 172 lb (78 kg)   SpO2 94%   BMI 32.50 kg/m    Physical Exam Vitals reviewed.  Constitutional:      General: She is not in acute distress.    Appearance: Normal appearance. She is not ill-appearing, toxic-appearing or diaphoretic.  HENT:     Head: Normocephalic and atraumatic.  Eyes:     General: No scleral icterus.       Right eye: No discharge.        Left eye: No discharge.     Conjunctiva/sclera: Conjunctivae normal.  Cardiovascular:     Rate and Rhythm: Normal rate and regular rhythm.     Heart sounds: Normal heart sounds. No murmur heard.    No friction rub. No gallop.  Pulmonary:     Effort: Pulmonary effort is normal. No respiratory distress.     Breath sounds: Normal breath sounds. No stridor. No wheezing, rhonchi or rales.  Musculoskeletal:        General: Normal range of motion.     Cervical back: Normal range of motion.     Right lower leg: Edema present.     Left lower leg: Edema present.  Skin:    General: Skin is warm and dry.     Capillary Refill: Capillary refill  takes less than 2 seconds.  Neurological:     General: No focal deficit present.     Mental Status: She is alert and oriented to person, place, and time. Mental status is at baseline.  Psychiatric:        Mood and Affect: Mood normal.        Behavior: Behavior normal.        Thought Content: Thought content normal.        Judgment: Judgment normal.

## 2024-02-20 NOTE — Telephone Encounter (Signed)
 FYI Only or Action Required?: FYI only for provider: appointment scheduled on 02/20/24.  Patient was last seen in primary care on 01/18/2024 by Joshua Debby CROME, MD.  Called Nurse Triage reporting Altered Mental Status.  Symptoms began a week ago.  Interventions attempted: Nothing.  Symptoms are: unchanged.  Triage Disposition: See HCP Within 4 Hours (Or PCP Triage)  Patient/caregiver understands and will follow disposition?: Yes   Copied from CRM #[id number]. Topic: Clinical - Red Word Triage >> Feb 20, 2024 10:37 AM Alfonso ORN wrote: Red Word that prompted transfer to Nurse Triage: olmesartan  (BENICAR ) 40 MG tablet pt consuming a lot of potassium and was told shouldn't injest a lot of potassium with medication , symptoms: a lot of confusion Reason for Disposition  [1] Acting confused (e.g., disoriented, slurred speech) AND [2] brief (now gone)  Answer Assessment - Initial Assessment Questions Concern she is eating too much potassium. On Benicar . Requesting labs.     1. LEVEL OF CONSCIOUSNESS: How are they (the patient) acting right now? (e.g., alert-oriented, confused, lethargic, stuporous, comatose)     Alert-oriented, worried because she has confusion at times, put bandaid on wrong finger the other day, forgot driving route.  2. ONSET: When did the confusion start?  (e.g., minutes, hours, days)     Wednesday-brief confusion  3. PATTERN: Does this come and go, or has it been constant since it started?  Is it present now?     Intermittent  4. ALCOHOL or DRUGS: Have they been drinking alcohol or taking any drugs?       5. NARCOTIC MEDICINES: Have they been receiving any narcotic medications? (e.g., morphine, Vicodin)      6. CAUSE: What do you think is causing the confusion?      Increased potassium 7. OTHER SYMPTOMS: Are there any other symptoms? (e.g., difficulty breathing, fever, headache, weakness)     Denies  Protocols used: Confusion - Delirium-A-AH

## 2024-02-21 ENCOUNTER — Encounter: Payer: Self-pay | Admitting: Family Medicine

## 2024-02-21 LAB — COMPREHENSIVE METABOLIC PANEL WITH GFR
ALT: 20 U/L (ref 0–35)
AST: 22 U/L (ref 0–37)
Albumin: 4.4 g/dL (ref 3.5–5.2)
Alkaline Phosphatase: 48 U/L (ref 39–117)
BUN: 17 mg/dL (ref 6–23)
CO2: 26 meq/L (ref 19–32)
Calcium: 9.5 mg/dL (ref 8.4–10.5)
Chloride: 105 meq/L (ref 96–112)
Creatinine, Ser: 1.04 mg/dL (ref 0.40–1.20)
GFR: 57.63 mL/min — ABNORMAL LOW (ref >60.00)
Glucose, Bld: 90 mg/dL (ref 70–99)
Potassium: 4.3 meq/L (ref 3.5–5.1)
Sodium: 143 meq/L (ref 135–145)
Total Bilirubin: 0.5 mg/dL (ref 0.2–1.2)
Total Protein: 6.8 g/dL (ref 6.0–8.3)

## 2024-02-21 LAB — RPR: RPR Ser Ql: NONREACTIVE

## 2024-02-21 LAB — MAGNESIUM: Magnesium: 2 mg/dL (ref 1.5–2.5)

## 2024-02-23 ENCOUNTER — Encounter: Payer: Self-pay | Admitting: Family Medicine

## 2024-02-24 NOTE — Telephone Encounter (Signed)
 Patient was seen in office

## 2024-02-27 ENCOUNTER — Ambulatory Visit: Payer: Self-pay | Admitting: Family Medicine

## 2024-02-27 ENCOUNTER — Encounter: Payer: Self-pay | Admitting: Family Medicine

## 2024-02-27 ENCOUNTER — Telehealth: Payer: Self-pay

## 2024-02-27 DIAGNOSIS — E538 Deficiency of other specified B group vitamins: Secondary | ICD-10-CM | POA: Insufficient documentation

## 2024-02-27 MED ORDER — CYANOCOBALAMIN 1000 MCG/ML IJ SOLN: 1000.0000 ug | INTRAMUSCULAR | Status: AC

## 2024-02-27 MED ORDER — CYANOCOBALAMIN 1000 MCG/ML IJ SOLN
1000.0000 ug | INTRAMUSCULAR | Status: AC
  Administered 2024-03-07: 1000 ug via INTRAMUSCULAR

## 2024-02-27 NOTE — Telephone Encounter (Signed)
 Please advise

## 2024-02-27 NOTE — Telephone Encounter (Signed)
 Copied from CRM #[id number]. Topic: General - Other >> Feb 27, 2024 12:33 PM Drema MATSU wrote: Reason for CRM: Patient wants to know why she has to take B12 for 4 weeks and then monthly for 2 months after that. She states that's he has a $35 copay everytime she comes. She wants to know if it is because pcp wants it to get in her system good or what is the reason for weekly and not monthly.

## 2024-03-07 ENCOUNTER — Ambulatory Visit

## 2024-03-07 DIAGNOSIS — E538 Deficiency of other specified B group vitamins: Secondary | ICD-10-CM | POA: Diagnosis not present

## 2024-03-07 NOTE — Progress Notes (Signed)
 B12 injection given w/o complications

## 2024-03-14 ENCOUNTER — Ambulatory Visit

## 2024-03-14 DIAGNOSIS — E538 Deficiency of other specified B group vitamins: Secondary | ICD-10-CM | POA: Diagnosis not present

## 2024-03-14 MED ORDER — CYANOCOBALAMIN 1000 MCG/ML IJ SOLN
1000.0000 ug | Freq: Once | INTRAMUSCULAR | Status: AC
  Administered 2024-03-14: 1000 ug via INTRAMUSCULAR

## 2024-03-14 NOTE — Progress Notes (Signed)
 Pt was given B12 injec w/o any complications at this time.

## 2024-03-21 ENCOUNTER — Ambulatory Visit

## 2024-03-21 DIAGNOSIS — E538 Deficiency of other specified B group vitamins: Secondary | ICD-10-CM

## 2024-03-21 MED ORDER — CYANOCOBALAMIN 1000 MCG/ML IJ SOLN
1000.0000 ug | Freq: Once | INTRAMUSCULAR | Status: AC
  Administered 2024-03-21: 09:00:00 1000 ug via INTRAMUSCULAR

## 2024-03-21 NOTE — Progress Notes (Signed)
 Pt here for monthly B12 injection per Dr.Jnoes  B12 1000mcg given IM and pt tolerated injection well.

## 2024-04-09 ENCOUNTER — Ambulatory Visit

## 2024-04-10 ENCOUNTER — Ambulatory Visit: Admitting: Internal Medicine

## 2024-04-11 ENCOUNTER — Ambulatory Visit (INDEPENDENT_AMBULATORY_CARE_PROVIDER_SITE_OTHER)

## 2024-04-11 DIAGNOSIS — E538 Deficiency of other specified B group vitamins: Secondary | ICD-10-CM

## 2024-04-11 MED ORDER — CYANOCOBALAMIN 1000 MCG/ML IJ SOLN
1000.0000 ug | Freq: Once | INTRAMUSCULAR | Status: AC
  Administered 2024-04-11: 1000 ug via INTRAMUSCULAR

## 2024-04-11 NOTE — Progress Notes (Signed)
 After obtaining consent, and per orders of Dr. Joshua, injection of B12 given by Ronnald SHAUNNA Palms. Patient instructed to report any adverse reaction to me immediately.

## 2024-04-18 ENCOUNTER — Ambulatory Visit: Admitting: Internal Medicine

## 2024-04-18 ENCOUNTER — Encounter: Payer: Self-pay | Admitting: Internal Medicine

## 2024-04-18 VITALS — BP 138/88 | HR 79 | Temp 98.1°F | Resp 16 | Ht 61.0 in | Wt 171.2 lb

## 2024-04-18 DIAGNOSIS — Z1231 Encounter for screening mammogram for malignant neoplasm of breast: Secondary | ICD-10-CM

## 2024-04-18 DIAGNOSIS — I10 Essential (primary) hypertension: Secondary | ICD-10-CM | POA: Diagnosis not present

## 2024-04-18 DIAGNOSIS — Z0001 Encounter for general adult medical examination with abnormal findings: Secondary | ICD-10-CM

## 2024-04-18 DIAGNOSIS — Z23 Encounter for immunization: Secondary | ICD-10-CM

## 2024-04-18 DIAGNOSIS — Z0000 Encounter for general adult medical examination without abnormal findings: Secondary | ICD-10-CM

## 2024-04-18 NOTE — Patient Instructions (Signed)

## 2024-04-18 NOTE — Progress Notes (Signed)
 "  Subjective:  Patient ID: Paula Parker, female    DOB: 10-17-1960  Age: 64 y.o. MRN: [id number]  CC: Hypertension (Patient states that she's not swelling like she used to and she has more energy. She is feeling so much better and wanted to let you know ) and Annual Exam   HPI Paula Parker presents for a CPX and f/up ---   Discussed the use of AI scribe software for clinical note transcription with the patient, who gave verbal consent to proceed.  History of Present Illness Paula Parker is a 64 year old female who presents with a bulge in the right upper abdomen and intermittent pain.  She has experienced a bulge in the right upper abdomen since her gallbladder surgery approximately four to five years ago. The bulge is more prominent when standing and less noticeable when lying down. 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 The pain is localized to the area of the bulge and does not radiate. No associated symptoms such as nausea, vomiting, loss of appetite, or weight loss are present.  Her energy levels have improved, which she attributes to receiving B12 shots. Her last B12 shot was on January 7th, and she is up to date with her injections. There was a delay in December due to the holidays, but her schedule resumed in January.  No chest pain, shortness of breath, dizziness, or lightheadedness, except for occasional shortness of breath with significant exertion. She also denies any pain or swelling in her legs.     Outpatient Medications Prior to Visit  Medication Sig Dispense Refill   Cholecalciferol  50 MCG (2000 UT) TABS Take 1 tablet (2,000 Units total) by mouth daily. 90 tablet 1   esomeprazole  (NEXIUM ) 40 MG capsule Take 1 capsule (40 mg total) by mouth daily at 12 noon. 90 capsule 1   olmesartan  (BENICAR ) 40 MG tablet Take 1 tablet (40 mg total) by mouth daily. 90 tablet 1   Tiotropium Bromide   Monohydrate (SPIRIVA  RESPIMAT) 2.5 MCG/ACT AERS Inhale 2 puffs into the lungs daily. (Patient taking differently: Inhale 2 puffs into the lungs daily as needed.) 12 g 1   Facility-Administered Medications Prior to Visit  Medication Dose Route Frequency Provider Last Rate Last Admin   cyanocobalamin  (VITAMIN B12) injection 1,000 mcg  1,000 mcg Intramuscular Q30 days         ROS Review of Systems  Constitutional:  Negative for appetite change, chills, diaphoresis, fatigue and fever.  HENT: Negative.    Eyes: Negative.   Respiratory: Negative.  Negative for choking, shortness of breath and wheezing.   Cardiovascular:  Negative for chest pain, palpitations and leg swelling.  Gastrointestinal:  Positive for abdominal pain. Negative for abdominal distention, constipation, diarrhea, nausea and vomiting.  Genitourinary: Negative.  Negative for difficulty urinating, dysuria and hematuria.  Musculoskeletal: Negative.  Negative for arthralgias and myalgias.  Skin: Negative.   Neurological: Negative.  Negative for dizziness and weakness.  Hematological:  Negative for adenopathy. Does not bruise/bleed easily.  Psychiatric/Behavioral: Negative.      Objective:  BP 138/88 (BP Location: Left Arm, Patient Position: Sitting, Cuff Size: Normal)   Pulse 79   Temp 98.1 F (36.7 C) (Oral)   Resp 16   Ht 5' 1 (1.549 m)   Wt 171 lb 3.2 oz (77.7 kg)   SpO2 97%   BMI 32.35 kg/m   BP Readings from Last 3 Encounters:  04/18/24  138/88  02/20/24 (!) 142/90  01/18/24 (!) 142/88    Wt Readings from Last 3 Encounters:  04/18/24 171 lb 3.2 oz (77.7 kg)  02/20/24 172 lb (78 kg)  01/18/24 174 lb (78.9 kg)    Physical Exam Vitals reviewed.  Constitutional:      Appearance: Normal appearance.  HENT:     Nose: Nose normal.     Mouth/Throat:     Mouth: Mucous membranes are moist.  Eyes:     General: No scleral icterus.    Conjunctiva/sclera: Conjunctivae normal.  Cardiovascular:     Rate and  Rhythm: Normal rate and regular rhythm.     Heart sounds: No murmur heard.    No friction rub. No gallop.  Pulmonary:     Effort: Pulmonary effort is normal.     Breath sounds: No stridor. No wheezing, rhonchi or rales.  Abdominal:     General: Abdomen is protuberant. Bowel sounds are normal. There is no distension.     Palpations: Abdomen is soft. There is no hepatomegaly, splenomegaly or mass.     Tenderness: There is no abdominal tenderness.     Hernia: No hernia is present.  Musculoskeletal:        General: No swelling.     Right lower leg: No edema.     Left lower leg: No edema.  Lymphadenopathy:     Cervical: No cervical adenopathy.  Skin:    General: Skin is warm and dry.  Neurological:     General: No focal deficit present.     Mental Status: She is alert.  Psychiatric:        Mood and Affect: Mood normal.        Behavior: Behavior normal.     Lab Results  Component Value Date   WBC 4.8 02/20/2024   HGB 14.2 02/20/2024   HCT 42.8 02/20/2024   PLT 269.0 02/20/2024   GLUCOSE 90 02/20/2024   CHOL 172 09/21/2023   TRIG 64.0 09/21/2023   HDL 71.70 09/21/2023   LDLCALC 88 09/21/2023   ALT 20 02/20/2024   AST 22 02/20/2024   NA 143 02/20/2024   K 4.3 02/20/2024   CL 105 02/20/2024   CREATININE 1.04 02/20/2024   BUN 17 02/20/2024   CO2 26 02/20/2024   TSH 2.03 02/20/2024   INR 1.0 05/27/2021   HGBA1C 5.9 10/13/2022    CT CORONARY MORPH W/CTA COR W/SCORE W/CA W/CM &/OR WO/CM Addendum Date: 10/24/2023 ADDENDUM REPORT: 10/24/2023 02:44 EXAM: OVER-READ INTERPRETATION  CT CHEST The following report is an over-read performed by radiologist Dr. Suzen Dials of [facility] on 10/24/2023. This over-read does not include interpretation of cardiac or coronary anatomy or pathology. The coronary calcium score/coronary CTA interpretation by the cardiologist is attached. COMPARISON:  November 03, 2020 FINDINGS: Cardiovascular: There are no significant extracardiac  vascular findings. Mediastinum/Nodes: There are no enlarged lymph nodes within the visualized mediastinum. Lungs/Pleura: There is no pleural effusion. Mild lingular and right middle lobe scarring and/or atelectatic changes are seen. Upper abdomen: No significant findings in the visualized upper abdomen. Musculoskeletal/Chest wall: No chest wall mass or suspicious osseous findings within the visualized chest. IMPRESSION: No significant extracardiac findings within the visualized chest. Electronically Signed   By: Suzen Dials M.D.   On: 10/24/2023 02:44   Result Date: 10/24/2023 HISTORY: Chest pain/anginal equiv, high CAD risk, not treadmill candidate EXAM: Cardiac/Coronary  CT PROTOCOL: A non-contrast, gated CT scan was obtained with axial slices of 2.5 mm through  the heart for calcium scoring. Calcium scoring was performed using the Agatston method. A 120 kV prospective, gated, contrast cardiac CT scan was obtained. Gantry rotation speed was 230 msec and collimation was 0.63 mm. Two sublingual nitroglycerin  tablets (0.8 mg) were given. The 3D data set was reconstructed with motion correction for the best systolic or diastolic phase. Images were analyzed on a dedicated workstation using MPR, MIP, and VRT modes. The patient received 95 cc of contrast. FINDINGS: Image quality: Average. Artifact: Limited. Coronary calcium score is 0, which places the patient in the 1st percentile for age and sex matched control. Coronary arteries: Normal coronary origins.  Right dominance. Left Main Coronary Artery: Normal caliber vessel, originates from the left coronary cusp, bifurcates to form a left anterior descending artery (LAD) and a left circumflex artery (LCX). Left main is patent. Left Anterior Descending Artery: Normal caliber vessel, reaches the apex, gives off 2 diagonal branches. LAD is patent. Minimal soft plaque (< 25%) mid to distal LAD. Myocardial bridge within the distal LAD (normal variant). First Diagonal  branch: Patent Second Diagonal branch: Patent Left Circumflex Artery: Normal caliber vessel, non-dominant, travels within the atrioventricular groove, gives off 1 obtuse marginal branches. LCx is patent. First Obtuse Marginal branch: Patent Right Coronary Artery: Dominant vessel, originates from the right coronary cusp, terminates as a PDA and right posterolateral branch. RCA is patent. Plaque Analysis: Calcified plaque 0 mm3 Non-calcified plaque 24 mm3 (low attenuation plaque 0 mm3). Total plaque volume (TPV) is 24 mm3, categorized as mild, which is 32nd percentile for age- and sex-matched controls. Aorta: Normal size, 29 mm at the mid ascending aorta (level of the PA bifurcation) measured double oblique. Aortic Valve: Native valve, trileaflet aortic valve, no significant calcification. Mitral valve: Native valve, no significant mitral annular calcification. Other findings: Normal pulmonary vein drainage into the left atrium. Normal left atrial appendage without thrombus. Normal size of the pulmonary artery. Please see separate report from [facility] for non-cardiac findings. IMPRESSION: 1. Coronary calcium score is 0, which places the patient in the 1st percentile for age and sex matched control. 2. Total plaque volume (TPV) is 24 mm3, categorized as mild, which is 32nd percentile for age- and sex-matched controls. 3. Normal coronary origins with right dominance. 4. CAD-RADS 1 minimal nonobstructive coronary disease. 5. Minimal soft plaque (< 25%) mid to distal LAD. Myocardial bridge within the distal LAD (normal variant). RECOMMENDATION: Consider non-atherosclerotic causes of chest pain. Consider preventive therapy and risk factor modification. Electronically Signed: By: Madonna Large On: 10/19/2023 18:29    Assessment & Plan:   Encounter for general adult medical examination with abnormal findings- Exam completed, labs reviewed, vaccines reviewed and updated, cancer screenings addressed, pt ed  material was given.   Screening mammogram for breast cancer -     3D Screening Mammogram, Left and Right; Future  Primary hypertension- Her BP is well controlled.  Need for prophylactic vaccination and inoculation against varicella -     Shingrix ; Inject 0.5 mLs into the muscle once for 1 dose.  Dispense: 0.5 mL; Refill: 1     Follow-up: Return in about 6 months (around 10/16/2024).  Debby Molt, MD "

## 2024-04-20 ENCOUNTER — Other Ambulatory Visit: Payer: Self-pay

## 2024-04-20 DIAGNOSIS — Z23 Encounter for immunization: Secondary | ICD-10-CM | POA: Insufficient documentation

## 2024-04-20 MED ORDER — SHINGRIX 50 MCG/0.5ML IM SUSR
0.5000 mL | Freq: Once | INTRAMUSCULAR | 1 refills | Status: AC
  Filled 2024-04-20: qty 0.5, 1d supply, fill #0

## 2024-05-09 ENCOUNTER — Ambulatory Visit

## 2024-05-16 ENCOUNTER — Ambulatory Visit

## 2024-05-23 ENCOUNTER — Ambulatory Visit

## 2024-10-17 ENCOUNTER — Ambulatory Visit: Admitting: Internal Medicine
